# Patient Record
Sex: Female | Born: 1955 | Race: White | Hispanic: No | Marital: Married | Smoking: Never smoker
Health system: Southern US, Community
[De-identification: ages and names within clinical notes are randomized; demographics above are authoritative.]

## PROBLEM LIST (undated history)

## (undated) DIAGNOSIS — I10 Essential (primary) hypertension: Secondary | ICD-10-CM

## (undated) DIAGNOSIS — Z8489 Family history of other specified conditions: Secondary | ICD-10-CM

## (undated) DIAGNOSIS — D649 Anemia, unspecified: Secondary | ICD-10-CM

## (undated) DIAGNOSIS — N1832 Chronic kidney disease, stage 3b: Secondary | ICD-10-CM

## (undated) DIAGNOSIS — M549 Dorsalgia, unspecified: Secondary | ICD-10-CM

## (undated) DIAGNOSIS — N6009 Solitary cyst of unspecified breast: Secondary | ICD-10-CM

## (undated) DIAGNOSIS — R930 Abnormal findings on diagnostic imaging of skull and head, not elsewhere classified: Secondary | ICD-10-CM

## (undated) DIAGNOSIS — E039 Hypothyroidism, unspecified: Secondary | ICD-10-CM

## (undated) DIAGNOSIS — R7989 Other specified abnormal findings of blood chemistry: Secondary | ICD-10-CM

## (undated) DIAGNOSIS — F419 Anxiety disorder, unspecified: Secondary | ICD-10-CM

## (undated) HISTORY — DX: Other specified abnormal findings of blood chemistry: R79.89

## (undated) HISTORY — DX: Hemochromatosis, unspecified: E83.119

## (undated) HISTORY — DX: Essential (primary) hypertension: I10

## (undated) HISTORY — DX: Abnormal findings on diagnostic imaging of skull and head, not elsewhere classified: R93.0

## (undated) HISTORY — DX: Anemia, unspecified: D64.9

## (undated) HISTORY — DX: Solitary cyst of unspecified breast: N60.09

## (undated) HISTORY — PX: NASAL SEPTUM SURGERY: SHX37

---
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
## 1993-12-12 HISTORY — PX: TUBAL LIGATION: SHX77

## 1998-05-26 ENCOUNTER — Other Ambulatory Visit: Admission: RE | Admit: 1998-05-26 | Discharge: 1998-05-26 | Payer: Self-pay | Admitting: *Deleted

## 1998-07-08 ENCOUNTER — Ambulatory Visit (HOSPITAL_COMMUNITY): Admission: RE | Admit: 1998-07-08 | Discharge: 1998-07-08 | Payer: Self-pay | Admitting: *Deleted

## 1998-07-20 ENCOUNTER — Ambulatory Visit (HOSPITAL_COMMUNITY): Admission: RE | Admit: 1998-07-20 | Discharge: 1998-07-20 | Payer: Self-pay | Admitting: *Deleted

## 1999-02-24 ENCOUNTER — Other Ambulatory Visit: Admission: RE | Admit: 1999-02-24 | Discharge: 1999-02-24 | Payer: Self-pay | Admitting: *Deleted

## 1999-07-22 ENCOUNTER — Ambulatory Visit (HOSPITAL_COMMUNITY): Admission: RE | Admit: 1999-07-22 | Discharge: 1999-07-22 | Payer: Self-pay | Admitting: *Deleted

## 1999-07-22 ENCOUNTER — Encounter: Payer: Self-pay | Admitting: *Deleted

## 2000-07-24 ENCOUNTER — Encounter: Payer: Self-pay | Admitting: Gynecology

## 2000-07-24 ENCOUNTER — Ambulatory Visit (HOSPITAL_COMMUNITY): Admission: RE | Admit: 2000-07-24 | Discharge: 2000-07-24 | Payer: Self-pay | Admitting: *Deleted

## 2004-09-29 ENCOUNTER — Ambulatory Visit: Payer: Self-pay | Admitting: Obstetrics and Gynecology

## 2004-12-01 ENCOUNTER — Ambulatory Visit: Payer: Self-pay | Admitting: Internal Medicine

## 2004-12-12 ENCOUNTER — Ambulatory Visit: Payer: Self-pay | Admitting: Internal Medicine

## 2005-01-12 ENCOUNTER — Ambulatory Visit: Payer: Self-pay | Admitting: Internal Medicine

## 2005-02-25 ENCOUNTER — Ambulatory Visit: Payer: Self-pay | Admitting: Gastroenterology

## 2005-03-30 ENCOUNTER — Ambulatory Visit: Payer: Self-pay | Admitting: Internal Medicine

## 2005-04-11 ENCOUNTER — Ambulatory Visit: Payer: Self-pay | Admitting: Internal Medicine

## 2005-06-29 ENCOUNTER — Ambulatory Visit: Payer: Self-pay | Admitting: Internal Medicine

## 2005-07-12 ENCOUNTER — Ambulatory Visit: Payer: Self-pay | Admitting: Internal Medicine

## 2005-08-12 ENCOUNTER — Ambulatory Visit: Payer: Self-pay | Admitting: Internal Medicine

## 2005-09-20 ENCOUNTER — Ambulatory Visit: Payer: Self-pay | Admitting: Internal Medicine

## 2005-10-04 ENCOUNTER — Ambulatory Visit: Payer: Self-pay | Admitting: Obstetrics and Gynecology

## 2005-10-12 ENCOUNTER — Ambulatory Visit: Payer: Self-pay | Admitting: Internal Medicine

## 2005-11-11 ENCOUNTER — Ambulatory Visit: Payer: Self-pay | Admitting: Internal Medicine

## 2005-12-12 ENCOUNTER — Ambulatory Visit: Payer: Self-pay | Admitting: Internal Medicine

## 2006-01-12 ENCOUNTER — Ambulatory Visit: Payer: Self-pay | Admitting: Internal Medicine

## 2006-02-09 ENCOUNTER — Ambulatory Visit: Payer: Self-pay | Admitting: Internal Medicine

## 2006-03-12 ENCOUNTER — Ambulatory Visit: Payer: Self-pay | Admitting: Internal Medicine

## 2006-04-11 ENCOUNTER — Ambulatory Visit: Payer: Self-pay | Admitting: Internal Medicine

## 2006-05-25 ENCOUNTER — Ambulatory Visit: Payer: Self-pay | Admitting: Internal Medicine

## 2006-06-11 ENCOUNTER — Ambulatory Visit: Payer: Self-pay | Admitting: Internal Medicine

## 2006-07-20 ENCOUNTER — Ambulatory Visit: Payer: Self-pay | Admitting: Internal Medicine

## 2006-08-12 ENCOUNTER — Ambulatory Visit: Payer: Self-pay | Admitting: Internal Medicine

## 2006-08-23 ENCOUNTER — Ambulatory Visit: Payer: Self-pay | Admitting: Obstetrics and Gynecology

## 2006-10-17 ENCOUNTER — Ambulatory Visit: Payer: Self-pay | Admitting: Obstetrics and Gynecology

## 2006-10-20 ENCOUNTER — Ambulatory Visit: Payer: Self-pay | Admitting: Obstetrics and Gynecology

## 2006-11-01 ENCOUNTER — Ambulatory Visit: Payer: Self-pay | Admitting: Internal Medicine

## 2006-11-03 ENCOUNTER — Other Ambulatory Visit: Payer: Self-pay

## 2006-11-10 ENCOUNTER — Ambulatory Visit: Payer: Self-pay | Admitting: Surgery

## 2006-11-11 ENCOUNTER — Ambulatory Visit: Payer: Self-pay | Admitting: Internal Medicine

## 2006-12-14 ENCOUNTER — Ambulatory Visit: Payer: Self-pay | Admitting: Internal Medicine

## 2007-01-12 ENCOUNTER — Ambulatory Visit: Payer: Self-pay | Admitting: Internal Medicine

## 2007-02-10 ENCOUNTER — Ambulatory Visit: Payer: Self-pay | Admitting: Internal Medicine

## 2007-03-13 ENCOUNTER — Ambulatory Visit: Payer: Self-pay | Admitting: Internal Medicine

## 2007-04-12 ENCOUNTER — Ambulatory Visit: Payer: Self-pay | Admitting: Internal Medicine

## 2007-05-13 ENCOUNTER — Ambulatory Visit: Payer: Self-pay | Admitting: Internal Medicine

## 2007-06-12 ENCOUNTER — Ambulatory Visit: Payer: Self-pay | Admitting: Internal Medicine

## 2007-07-13 ENCOUNTER — Ambulatory Visit: Payer: Self-pay | Admitting: Internal Medicine

## 2007-08-13 ENCOUNTER — Ambulatory Visit: Payer: Self-pay | Admitting: Internal Medicine

## 2007-09-12 ENCOUNTER — Ambulatory Visit: Payer: Self-pay | Admitting: Internal Medicine

## 2007-10-03 ENCOUNTER — Encounter (INDEPENDENT_AMBULATORY_CARE_PROVIDER_SITE_OTHER): Payer: Self-pay | Admitting: Gynecology

## 2007-10-03 ENCOUNTER — Ambulatory Visit: Payer: Self-pay | Admitting: Gynecology

## 2007-10-05 ENCOUNTER — Ambulatory Visit: Payer: Self-pay | Admitting: Internal Medicine

## 2007-10-08 ENCOUNTER — Ambulatory Visit: Payer: Self-pay | Admitting: Gynecology

## 2007-10-13 ENCOUNTER — Ambulatory Visit: Payer: Self-pay | Admitting: Internal Medicine

## 2007-11-12 ENCOUNTER — Ambulatory Visit: Payer: Self-pay | Admitting: Internal Medicine

## 2007-11-12 ENCOUNTER — Encounter: Admission: RE | Admit: 2007-11-12 | Discharge: 2007-11-12 | Payer: Self-pay | Admitting: Gynecology

## 2007-12-13 ENCOUNTER — Ambulatory Visit: Payer: Self-pay | Admitting: Internal Medicine

## 2007-12-20 ENCOUNTER — Ambulatory Visit: Payer: Self-pay | Admitting: Internal Medicine

## 2008-01-13 ENCOUNTER — Ambulatory Visit: Payer: Self-pay | Admitting: Internal Medicine

## 2008-02-10 ENCOUNTER — Ambulatory Visit: Payer: Self-pay | Admitting: Internal Medicine

## 2008-03-12 ENCOUNTER — Ambulatory Visit: Payer: Self-pay | Admitting: Internal Medicine

## 2008-04-11 ENCOUNTER — Ambulatory Visit: Payer: Self-pay | Admitting: Internal Medicine

## 2008-04-14 ENCOUNTER — Ambulatory Visit: Payer: Self-pay | Admitting: Internal Medicine

## 2008-05-12 ENCOUNTER — Ambulatory Visit: Payer: Self-pay | Admitting: Internal Medicine

## 2008-06-11 ENCOUNTER — Ambulatory Visit: Payer: Self-pay | Admitting: Internal Medicine

## 2008-07-03 ENCOUNTER — Ambulatory Visit: Payer: Self-pay | Admitting: Internal Medicine

## 2008-07-12 ENCOUNTER — Ambulatory Visit: Payer: Self-pay | Admitting: Internal Medicine

## 2008-08-12 ENCOUNTER — Ambulatory Visit: Payer: Self-pay | Admitting: Internal Medicine

## 2008-09-11 ENCOUNTER — Ambulatory Visit: Payer: Self-pay | Admitting: Internal Medicine

## 2008-10-12 ENCOUNTER — Ambulatory Visit: Payer: Self-pay | Admitting: Internal Medicine

## 2008-11-11 ENCOUNTER — Ambulatory Visit: Payer: Self-pay | Admitting: Internal Medicine

## 2008-11-18 ENCOUNTER — Ambulatory Visit: Payer: Self-pay | Admitting: Obstetrics and Gynecology

## 2008-12-12 ENCOUNTER — Ambulatory Visit: Payer: Self-pay | Admitting: Internal Medicine

## 2008-12-17 ENCOUNTER — Ambulatory Visit: Payer: Self-pay | Admitting: Internal Medicine

## 2009-01-12 ENCOUNTER — Ambulatory Visit: Payer: Self-pay | Admitting: Internal Medicine

## 2009-03-12 ENCOUNTER — Ambulatory Visit: Payer: Self-pay | Admitting: Internal Medicine

## 2009-03-19 ENCOUNTER — Ambulatory Visit: Payer: Self-pay | Admitting: Internal Medicine

## 2009-04-11 ENCOUNTER — Ambulatory Visit: Payer: Self-pay | Admitting: Internal Medicine

## 2009-06-11 ENCOUNTER — Ambulatory Visit: Payer: Self-pay | Admitting: Internal Medicine

## 2009-06-30 ENCOUNTER — Ambulatory Visit: Payer: Self-pay | Admitting: Internal Medicine

## 2009-07-12 ENCOUNTER — Ambulatory Visit: Payer: Self-pay | Admitting: Internal Medicine

## 2009-09-11 ENCOUNTER — Ambulatory Visit: Payer: Self-pay | Admitting: Internal Medicine

## 2009-09-24 ENCOUNTER — Ambulatory Visit: Payer: Self-pay | Admitting: Internal Medicine

## 2009-10-12 ENCOUNTER — Ambulatory Visit: Payer: Self-pay | Admitting: Internal Medicine

## 2009-12-09 ENCOUNTER — Ambulatory Visit: Payer: Self-pay | Admitting: Obstetrics and Gynecology

## 2009-12-12 ENCOUNTER — Ambulatory Visit: Payer: Self-pay | Admitting: Internal Medicine

## 2009-12-12 HISTORY — PX: BREAST EXCISIONAL BIOPSY: SUR124

## 2009-12-17 ENCOUNTER — Ambulatory Visit: Payer: Self-pay | Admitting: Internal Medicine

## 2010-01-12 ENCOUNTER — Ambulatory Visit: Payer: Self-pay | Admitting: Internal Medicine

## 2010-02-09 ENCOUNTER — Ambulatory Visit: Payer: Self-pay | Admitting: Internal Medicine

## 2010-03-11 ENCOUNTER — Ambulatory Visit: Payer: Self-pay | Admitting: Internal Medicine

## 2010-03-12 ENCOUNTER — Ambulatory Visit: Payer: Self-pay | Admitting: Internal Medicine

## 2010-05-12 ENCOUNTER — Ambulatory Visit: Payer: Self-pay | Admitting: Internal Medicine

## 2010-06-07 ENCOUNTER — Ambulatory Visit: Payer: Self-pay | Admitting: Internal Medicine

## 2010-06-11 ENCOUNTER — Ambulatory Visit: Payer: Self-pay | Admitting: Internal Medicine

## 2010-07-12 ENCOUNTER — Ambulatory Visit: Payer: Self-pay | Admitting: Internal Medicine

## 2010-10-14 ENCOUNTER — Ambulatory Visit: Payer: Self-pay | Admitting: Internal Medicine

## 2010-11-11 ENCOUNTER — Ambulatory Visit: Payer: Self-pay | Admitting: Internal Medicine

## 2010-12-21 ENCOUNTER — Ambulatory Visit: Payer: Self-pay | Admitting: Obstetrics and Gynecology

## 2010-12-27 ENCOUNTER — Ambulatory Visit: Payer: Self-pay | Admitting: Internal Medicine

## 2010-12-29 LAB — AFP TUMOR MARKER: AFP-Tumor Marker: 1.8 ng/mL (ref 0.0–8.3)

## 2011-01-12 ENCOUNTER — Ambulatory Visit: Payer: Self-pay | Admitting: Internal Medicine

## 2011-01-26 HISTORY — PX: BONE MARROW BIOPSY: SHX199

## 2011-02-10 ENCOUNTER — Ambulatory Visit: Payer: Self-pay | Admitting: Internal Medicine

## 2011-03-13 ENCOUNTER — Ambulatory Visit: Payer: Self-pay | Admitting: Internal Medicine

## 2011-04-12 ENCOUNTER — Ambulatory Visit: Payer: Self-pay | Admitting: Internal Medicine

## 2011-05-13 ENCOUNTER — Ambulatory Visit: Payer: Self-pay | Admitting: Internal Medicine

## 2011-06-12 ENCOUNTER — Ambulatory Visit: Payer: Self-pay | Admitting: Internal Medicine

## 2011-07-13 ENCOUNTER — Ambulatory Visit: Payer: Self-pay | Admitting: Internal Medicine

## 2011-07-13 HISTORY — PX: COLONOSCOPY: SHX174

## 2011-07-13 HISTORY — PX: ESOPHAGOGASTRODUODENOSCOPY: SHX1529

## 2011-07-14 ENCOUNTER — Ambulatory Visit: Payer: Self-pay | Admitting: Gastroenterology

## 2011-07-18 LAB — PATHOLOGY REPORT

## 2011-08-13 ENCOUNTER — Ambulatory Visit: Payer: Self-pay | Admitting: Internal Medicine

## 2011-09-12 ENCOUNTER — Ambulatory Visit: Payer: Self-pay | Admitting: Internal Medicine

## 2011-10-13 ENCOUNTER — Ambulatory Visit: Payer: Self-pay | Admitting: Internal Medicine

## 2011-12-13 HISTORY — PX: CHOLECYSTECTOMY: SHX55

## 2011-12-26 ENCOUNTER — Ambulatory Visit: Payer: Self-pay | Admitting: Internal Medicine

## 2011-12-26 LAB — CBC CANCER CENTER
Basophil #: 0 x10 3/mm (ref 0.0–0.1)
Basophil %: 0.4 %
Eosinophil #: 0.1 x10 3/mm (ref 0.0–0.7)
Eosinophil %: 2.8 %
HCT: 31.5 % — ABNORMAL LOW (ref 35.0–47.0)
HGB: 11.2 g/dL — ABNORMAL LOW (ref 12.0–16.0)
Lymphocyte #: 0.9 x10 3/mm — ABNORMAL LOW (ref 1.0–3.6)
Lymphocyte %: 31.9 %
MCH: 34 pg (ref 26.0–34.0)
MCHC: 35.6 g/dL (ref 32.0–36.0)
MCV: 96 fL (ref 80–100)
Monocyte #: 0.3 x10 3/mm (ref 0.0–0.7)
Monocyte %: 9.5 %
Neutrophil #: 1.5 x10 3/mm (ref 1.4–6.5)
Neutrophil %: 55.4 %
Platelet: 269 x10 3/mm (ref 150–440)
RBC: 3.3 10*6/uL — ABNORMAL LOW (ref 3.80–5.20)
RDW: 14.1 % (ref 11.5–14.5)
WBC: 2.8 x10 3/mm — ABNORMAL LOW (ref 3.6–11.0)

## 2011-12-26 LAB — HEPATIC FUNCTION PANEL A (ARMC)
Albumin: 4 g/dL (ref 3.4–5.0)
Alkaline Phosphatase: 67 U/L (ref 50–136)
Bilirubin, Direct: 0.1 mg/dL (ref 0.00–0.20)
Bilirubin,Total: 0.3 mg/dL (ref 0.2–1.0)
SGOT(AST): 24 U/L (ref 15–37)
SGPT (ALT): 31 U/L
Total Protein: 7.5 g/dL (ref 6.4–8.2)

## 2011-12-26 LAB — CREATININE, SERUM
Creatinine: 1.26 mg/dL (ref 0.60–1.30)
EGFR (African American): 56 — ABNORMAL LOW
EGFR (Non-African Amer.): 47 — ABNORMAL LOW

## 2011-12-26 LAB — FERRITIN: Ferritin (ARMC): 457 ng/mL — ABNORMAL HIGH (ref 8–388)

## 2012-01-05 LAB — URINALYSIS, COMPLETE
Bacteria: NONE SEEN
Bilirubin,UR: NEGATIVE
Blood: NEGATIVE
Glucose,UR: NEGATIVE mg/dL (ref 0–75)
Hyaline Cast: 3
Ketone: NEGATIVE
Leukocyte Esterase: NEGATIVE
Nitrite: NEGATIVE
Ph: 5 (ref 4.5–8.0)
Protein: NEGATIVE
RBC,UR: 1 /HPF (ref 0–5)
Specific Gravity: 1.025 (ref 1.003–1.030)
Squamous Epithelial: 1
WBC UR: 1 /HPF (ref 0–5)

## 2012-01-05 LAB — CBC CANCER CENTER
Basophil #: 0 x10 3/mm (ref 0.0–0.1)
Basophil %: 0.5 %
Eosinophil #: 0.1 x10 3/mm (ref 0.0–0.7)
Eosinophil %: 4.3 %
HCT: 30.1 % — ABNORMAL LOW (ref 35.0–47.0)
HGB: 10.6 g/dL — ABNORMAL LOW (ref 12.0–16.0)
Lymphocyte #: 1.2 x10 3/mm (ref 1.0–3.6)
Lymphocyte %: 38.6 %
MCH: 34.4 pg — ABNORMAL HIGH (ref 26.0–34.0)
MCHC: 35.3 g/dL (ref 32.0–36.0)
MCV: 98 fL (ref 80–100)
Monocyte #: 0.3 x10 3/mm (ref 0.0–0.7)
Monocyte %: 9.2 %
Neutrophil #: 1.5 x10 3/mm (ref 1.4–6.5)
Neutrophil %: 47.4 %
Platelet: 289 x10 3/mm (ref 150–440)
RBC: 3.09 10*6/uL — ABNORMAL LOW (ref 3.80–5.20)
RDW: 14.7 % — ABNORMAL HIGH (ref 11.5–14.5)
WBC: 3.1 x10 3/mm — ABNORMAL LOW (ref 3.6–11.0)

## 2012-01-06 LAB — URINE CULTURE

## 2012-01-13 ENCOUNTER — Ambulatory Visit: Payer: Self-pay | Admitting: Obstetrics and Gynecology

## 2012-01-13 ENCOUNTER — Ambulatory Visit: Payer: Self-pay | Admitting: Internal Medicine

## 2012-01-26 LAB — CANCER CENTER HEMOGLOBIN: HGB: 11.2 g/dL — ABNORMAL LOW (ref 12.0–16.0)

## 2012-02-09 LAB — CANCER CENTER HEMOGLOBIN: HGB: 11.3 g/dL — ABNORMAL LOW (ref 12.0–16.0)

## 2012-02-10 ENCOUNTER — Ambulatory Visit: Payer: Self-pay | Admitting: Internal Medicine

## 2012-02-10 LAB — CA 125: CA 125: 20.6 U/mL (ref 0.0–34.0)

## 2012-02-10 LAB — AFP TUMOR MARKER: AFP-Tumor Marker: 2.5 ng/mL (ref 0.0–8.3)

## 2012-02-24 LAB — CANCER CENTER HEMOGLOBIN: HGB: 10.9 g/dL — ABNORMAL LOW (ref 12.0–16.0)

## 2012-03-12 ENCOUNTER — Ambulatory Visit: Payer: Self-pay | Admitting: Internal Medicine

## 2012-03-21 LAB — CANCER CENTER HEMOGLOBIN: HGB: 11.3 g/dL — ABNORMAL LOW (ref 12.0–16.0)

## 2012-04-04 LAB — HEPATIC FUNCTION PANEL A (ARMC)
Albumin: 4.4 g/dL (ref 3.4–5.0)
Alkaline Phosphatase: 71 U/L (ref 50–136)
Bilirubin, Direct: 0.1 mg/dL (ref 0.00–0.20)
Bilirubin,Total: 0.3 mg/dL (ref 0.2–1.0)
SGOT(AST): 117 U/L — ABNORMAL HIGH (ref 15–37)
SGPT (ALT): 171 U/L — ABNORMAL HIGH
Total Protein: 7.9 g/dL (ref 6.4–8.2)

## 2012-04-04 LAB — CBC CANCER CENTER
Basophil #: 0 x10 3/mm (ref 0.0–0.1)
Basophil %: 0.9 %
Eosinophil #: 0.2 x10 3/mm (ref 0.0–0.7)
Eosinophil %: 6.2 %
HCT: 34.4 % — ABNORMAL LOW (ref 35.0–47.0)
HGB: 11.5 g/dL — ABNORMAL LOW (ref 12.0–16.0)
Lymphocyte #: 1 x10 3/mm (ref 1.0–3.6)
Lymphocyte %: 30.1 %
MCH: 33 pg (ref 26.0–34.0)
MCHC: 33.5 g/dL (ref 32.0–36.0)
MCV: 99 fL (ref 80–100)
Monocyte #: 0.5 x10 3/mm (ref 0.2–0.9)
Monocyte %: 14.6 %
Neutrophil #: 1.6 x10 3/mm (ref 1.4–6.5)
Neutrophil %: 48.2 %
Platelet: 250 x10 3/mm (ref 150–440)
RBC: 3.49 10*6/uL — ABNORMAL LOW (ref 3.80–5.20)
RDW: 12.6 % (ref 11.5–14.5)
WBC: 3.4 x10 3/mm — ABNORMAL LOW (ref 3.6–11.0)

## 2012-04-04 LAB — SGOT (AST)(ARMC): SGOT(AST): 113 U/L — ABNORMAL HIGH (ref 15–37)

## 2012-04-04 LAB — ACETAMINOPHEN LEVEL: Acetaminophen: <2 ug/mL

## 2012-04-04 LAB — CREATININE, SERUM
Creatinine: 0.98 mg/dL (ref 0.60–1.30)
EGFR (African American): >60
EGFR (Non-African Amer.): >60

## 2012-04-04 LAB — ALT: SGPT (ALT): 168 U/L — ABNORMAL HIGH

## 2012-04-05 LAB — HEPATIC FUNCTION PANEL A (ARMC)
Albumin: 4.2 g/dL (ref 3.4–5.0)
Alkaline Phosphatase: 66 U/L (ref 50–136)
Bilirubin, Direct: 0.1 mg/dL (ref 0.00–0.20)
Bilirubin,Total: 0.3 mg/dL (ref 0.2–1.0)
SGOT(AST): 95 U/L — ABNORMAL HIGH (ref 15–37)
SGPT (ALT): 154 U/L — ABNORMAL HIGH
Total Protein: 7.6 g/dL (ref 6.4–8.2)

## 2012-04-11 ENCOUNTER — Ambulatory Visit: Payer: Self-pay | Admitting: Internal Medicine

## 2012-04-23 LAB — FERRITIN: Ferritin (ARMC): 63 ng/mL (ref 8–388)

## 2012-05-12 ENCOUNTER — Ambulatory Visit: Payer: Self-pay | Admitting: Internal Medicine

## 2012-05-18 ENCOUNTER — Observation Stay: Payer: Self-pay | Admitting: Surgery

## 2012-05-18 LAB — URINALYSIS, COMPLETE
Bacteria: NONE SEEN
Bilirubin,UR: NEGATIVE
Blood: NEGATIVE
Glucose,UR: NEGATIVE mg/dL
Hyaline Cast: 1
Ketone: NEGATIVE
Leukocyte Esterase: NEGATIVE
Nitrite: NEGATIVE
Ph: 5
Protein: NEGATIVE
RBC,UR: 2 /HPF
Specific Gravity: 1.021
Squamous Epithelial: 5
WBC UR: 1 /HPF

## 2012-05-18 LAB — CBC
HCT: 36.5 % (ref 35.0–47.0)
HGB: 12.3 g/dL (ref 12.0–16.0)
MCH: 32.6 pg (ref 26.0–34.0)
MCHC: 33.8 g/dL (ref 32.0–36.0)
MCV: 96 fL (ref 80–100)
Platelet: 270 10*3/uL (ref 150–440)
RBC: 3.79 10*6/uL — ABNORMAL LOW (ref 3.80–5.20)
RDW: 12.8 % (ref 11.5–14.5)
WBC: 5.4 10*3/uL (ref 3.6–11.0)

## 2012-05-18 LAB — BASIC METABOLIC PANEL WITH GFR
Anion Gap: 9
BUN: 19 mg/dL — ABNORMAL HIGH
Calcium, Total: 9.5 mg/dL
Chloride: 104 mmol/L
Co2: 27 mmol/L
Creatinine: 1.14 mg/dL
EGFR (African American): >60
EGFR (Non-African Amer.): 53 — ABNORMAL LOW
Glucose: 109 mg/dL — ABNORMAL HIGH
Osmolality: 282
Potassium: 3.9 mmol/L
Sodium: 140 mmol/L

## 2012-05-18 LAB — HEPATIC FUNCTION PANEL A (ARMC)
Albumin: 4.2 g/dL
Alkaline Phosphatase: 58 U/L
Bilirubin, Direct: <0.1 mg/dL
Bilirubin,Total: 0.2 mg/dL
SGOT(AST): 27 U/L
SGPT (ALT): 30 U/L
Total Protein: 8.1 g/dL

## 2012-05-18 LAB — LIPASE, BLOOD: Lipase: 183 U/L

## 2012-05-19 ENCOUNTER — Emergency Department: Payer: Self-pay | Admitting: Emergency Medicine

## 2012-05-19 LAB — CBC
HCT: 31.7 % — ABNORMAL LOW (ref 35.0–47.0)
HGB: 10.7 g/dL — ABNORMAL LOW (ref 12.0–16.0)
MCH: 32.7 pg (ref 26.0–34.0)
MCHC: 33.8 g/dL (ref 32.0–36.0)
MCV: 97 fL (ref 80–100)
Platelet: 210 10*3/uL (ref 150–440)
RBC: 3.27 10*6/uL — ABNORMAL LOW (ref 3.80–5.20)
RDW: 12.8 % (ref 11.5–14.5)
WBC: 4.7 10*3/uL (ref 3.6–11.0)

## 2012-05-19 LAB — COMPREHENSIVE METABOLIC PANEL
Albumin: 3.4 g/dL (ref 3.4–5.0)
Alkaline Phosphatase: 61 U/L (ref 50–136)
Anion Gap: 8 (ref 7–16)
BUN: 10 mg/dL (ref 7–18)
Bilirubin,Total: 0.3 mg/dL (ref 0.2–1.0)
Calcium, Total: 9 mg/dL (ref 8.5–10.1)
Chloride: 108 mmol/L — ABNORMAL HIGH (ref 98–107)
Co2: 26 mmol/L (ref 21–32)
Creatinine: 1.09 mg/dL (ref 0.60–1.30)
EGFR (African American): >60
EGFR (Non-African Amer.): 56 — ABNORMAL LOW
Glucose: 94 mg/dL (ref 65–99)
Osmolality: 282 (ref 275–301)
Potassium: 4.6 mmol/L (ref 3.5–5.1)
SGOT(AST): 31 U/L (ref 15–37)
SGPT (ALT): 28 U/L
Sodium: 142 mmol/L (ref 136–145)
Total Protein: 6.2 g/dL — ABNORMAL LOW (ref 6.4–8.2)

## 2012-05-19 LAB — LIPASE, BLOOD: Lipase: 292 U/L (ref 73–393)

## 2012-05-22 LAB — PATHOLOGY REPORT

## 2012-06-01 LAB — CBC CANCER CENTER
Basophil #: 0 x10 3/mm (ref 0.0–0.1)
Basophil %: 0.6 %
Eosinophil #: 0.2 x10 3/mm (ref 0.0–0.7)
Eosinophil %: 4.9 %
HCT: 37.3 % (ref 35.0–47.0)
HGB: 12.5 g/dL (ref 12.0–16.0)
Lymphocyte #: 1.2 x10 3/mm (ref 1.0–3.6)
Lymphocyte %: 32.8 %
MCH: 32.1 pg (ref 26.0–34.0)
MCHC: 33.5 g/dL (ref 32.0–36.0)
MCV: 96 fL (ref 80–100)
Monocyte #: 0.4 x10 3/mm (ref 0.2–0.9)
Monocyte %: 11.3 %
Neutrophil #: 1.8 x10 3/mm (ref 1.4–6.5)
Neutrophil %: 50.4 %
Platelet: 258 x10 3/mm (ref 150–440)
RBC: 3.89 10*6/uL (ref 3.80–5.20)
RDW: 12.9 % (ref 11.5–14.5)
WBC: 3.5 x10 3/mm — ABNORMAL LOW (ref 3.6–11.0)

## 2012-06-11 ENCOUNTER — Ambulatory Visit: Payer: Self-pay | Admitting: Internal Medicine

## 2012-07-12 ENCOUNTER — Ambulatory Visit: Payer: Self-pay

## 2012-07-12 ENCOUNTER — Ambulatory Visit: Payer: Self-pay | Admitting: Internal Medicine

## 2012-09-12 ENCOUNTER — Ambulatory Visit: Payer: Self-pay | Admitting: Internal Medicine

## 2012-10-12 ENCOUNTER — Ambulatory Visit: Payer: Self-pay | Admitting: Internal Medicine

## 2012-11-30 ENCOUNTER — Ambulatory Visit: Payer: Self-pay | Admitting: Internal Medicine

## 2012-12-12 ENCOUNTER — Ambulatory Visit: Payer: Self-pay | Admitting: Internal Medicine

## 2013-04-11 ENCOUNTER — Ambulatory Visit: Payer: Self-pay | Admitting: Internal Medicine

## 2013-05-12 ENCOUNTER — Ambulatory Visit: Payer: Self-pay | Admitting: Internal Medicine

## 2013-05-14 ENCOUNTER — Ambulatory Visit: Payer: Self-pay | Admitting: Obstetrics and Gynecology

## 2013-07-24 ENCOUNTER — Ambulatory Visit: Payer: Self-pay | Admitting: Internal Medicine

## 2013-09-25 ENCOUNTER — Ambulatory Visit: Payer: Self-pay | Admitting: Internal Medicine

## 2013-10-12 ENCOUNTER — Ambulatory Visit: Payer: Self-pay | Admitting: Internal Medicine

## 2014-01-01 ENCOUNTER — Ambulatory Visit: Payer: Self-pay | Admitting: Internal Medicine

## 2014-01-12 ENCOUNTER — Ambulatory Visit: Payer: Self-pay | Admitting: Internal Medicine

## 2014-01-30 LAB — CANCER CENTER HEMOGLOBIN: HGB: 11.6 g/dL — ABNORMAL LOW (ref 12.0–16.0)

## 2014-02-09 ENCOUNTER — Ambulatory Visit: Payer: Self-pay | Admitting: Internal Medicine

## 2014-03-12 ENCOUNTER — Ambulatory Visit: Payer: Self-pay | Admitting: Internal Medicine

## 2014-04-11 ENCOUNTER — Ambulatory Visit: Payer: Self-pay | Admitting: Internal Medicine

## 2014-05-01 DIAGNOSIS — F419 Anxiety disorder, unspecified: Secondary | ICD-10-CM | POA: Insufficient documentation

## 2014-05-01 DIAGNOSIS — F411 Generalized anxiety disorder: Secondary | ICD-10-CM | POA: Insufficient documentation

## 2014-05-01 DIAGNOSIS — M2550 Pain in unspecified joint: Secondary | ICD-10-CM | POA: Insufficient documentation

## 2014-05-01 DIAGNOSIS — I10 Essential (primary) hypertension: Secondary | ICD-10-CM | POA: Insufficient documentation

## 2014-05-12 ENCOUNTER — Ambulatory Visit: Payer: Self-pay | Admitting: Internal Medicine

## 2014-05-15 ENCOUNTER — Ambulatory Visit: Payer: Self-pay | Admitting: Obstetrics and Gynecology

## 2014-05-19 ENCOUNTER — Ambulatory Visit: Payer: Self-pay | Admitting: Obstetrics and Gynecology

## 2014-07-08 ENCOUNTER — Ambulatory Visit: Payer: Self-pay | Admitting: Internal Medicine

## 2014-07-09 ENCOUNTER — Ambulatory Visit: Payer: Self-pay | Admitting: General Surgery

## 2014-07-09 ENCOUNTER — Encounter: Payer: Self-pay | Admitting: General Surgery

## 2014-07-09 NOTE — Progress Notes (Signed)
This encounter was created in error - please disregard.

## 2014-07-09 NOTE — Progress Notes (Deleted)
Patient ID: Katie Wise, female   DOB: April 08, 1956, 58 y.o.   MRN: [id number]  Chief Complaint  Patient presents with  . Other    mammogram    HPI [name] is a 58 y.o. female HPI  No past medical history on file.  No past surgical history on file.  No family history on file.  Social History History  Substance Use Topics  . Smoking status: Not on file  . Smokeless tobacco: Not on file  . Alcohol Use: Not on file    Allergies not on file  No current outpatient prescriptions on file.   No current facility-administered medications for this visit.    Review of Systems Review of Systems  There were no vitals taken for this visit.  Physical Exam Physical Exam  Data Reviewed ***  Assessment    ***    Plan    ***       Gaspar Cola 07/09/2014, 2:22 PM

## 2014-07-10 ENCOUNTER — Encounter: Payer: Self-pay | Admitting: General Surgery

## 2014-07-12 ENCOUNTER — Ambulatory Visit: Payer: Self-pay | Admitting: Internal Medicine

## 2014-08-07 DIAGNOSIS — R9402 Abnormal brain scan: Secondary | ICD-10-CM | POA: Insufficient documentation

## 2014-08-07 DIAGNOSIS — I639 Cerebral infarction, unspecified: Secondary | ICD-10-CM | POA: Insufficient documentation

## 2014-08-07 DIAGNOSIS — R519 Headache, unspecified: Secondary | ICD-10-CM | POA: Insufficient documentation

## 2014-08-07 DIAGNOSIS — R51 Headache: Secondary | ICD-10-CM

## 2014-08-20 ENCOUNTER — Ambulatory Visit: Payer: Self-pay | Admitting: Neurology

## 2014-09-04 ENCOUNTER — Ambulatory Visit: Payer: Self-pay | Admitting: Internal Medicine

## 2014-10-30 ENCOUNTER — Ambulatory Visit: Payer: Self-pay | Admitting: Internal Medicine

## 2014-11-05 DIAGNOSIS — Z8673 Personal history of transient ischemic attack (TIA), and cerebral infarction without residual deficits: Secondary | ICD-10-CM | POA: Insufficient documentation

## 2014-11-11 ENCOUNTER — Ambulatory Visit: Payer: Self-pay | Admitting: Internal Medicine

## 2014-11-19 ENCOUNTER — Ambulatory Visit: Payer: Self-pay | Admitting: Obstetrics and Gynecology

## 2014-12-19 ENCOUNTER — Ambulatory Visit: Payer: Self-pay | Admitting: Internal Medicine

## 2015-01-12 ENCOUNTER — Ambulatory Visit: Payer: Self-pay | Admitting: Internal Medicine

## 2015-02-10 ENCOUNTER — Ambulatory Visit
Admit: 2015-02-10 | Disposition: A | Discharge status: Home / Self Care | Payer: Self-pay | Attending: Internal Medicine | Admitting: Internal Medicine

## 2015-03-18 ENCOUNTER — Ambulatory Visit
Admit: 2015-03-18 | Disposition: A | Discharge status: Home / Self Care | Payer: Self-pay | Attending: Internal Medicine | Admitting: Internal Medicine

## 2015-03-31 IMAGING — MG MM ADDITIONAL VIEWS AT NO CHARGE
1 series · 2 of 2 positions shown · non-contrast
Comparison: With priors.

CLINICAL DATA: Abnormal right screening mammogram.

EXAM:
DIGITAL DIAGNOSTIC  RIGHT MAMMOGRAM WITH CAD
ULTRASOUND RIGHT BREAST

[R ML · right · 2 of 2 slices shown]
[im 1/2]
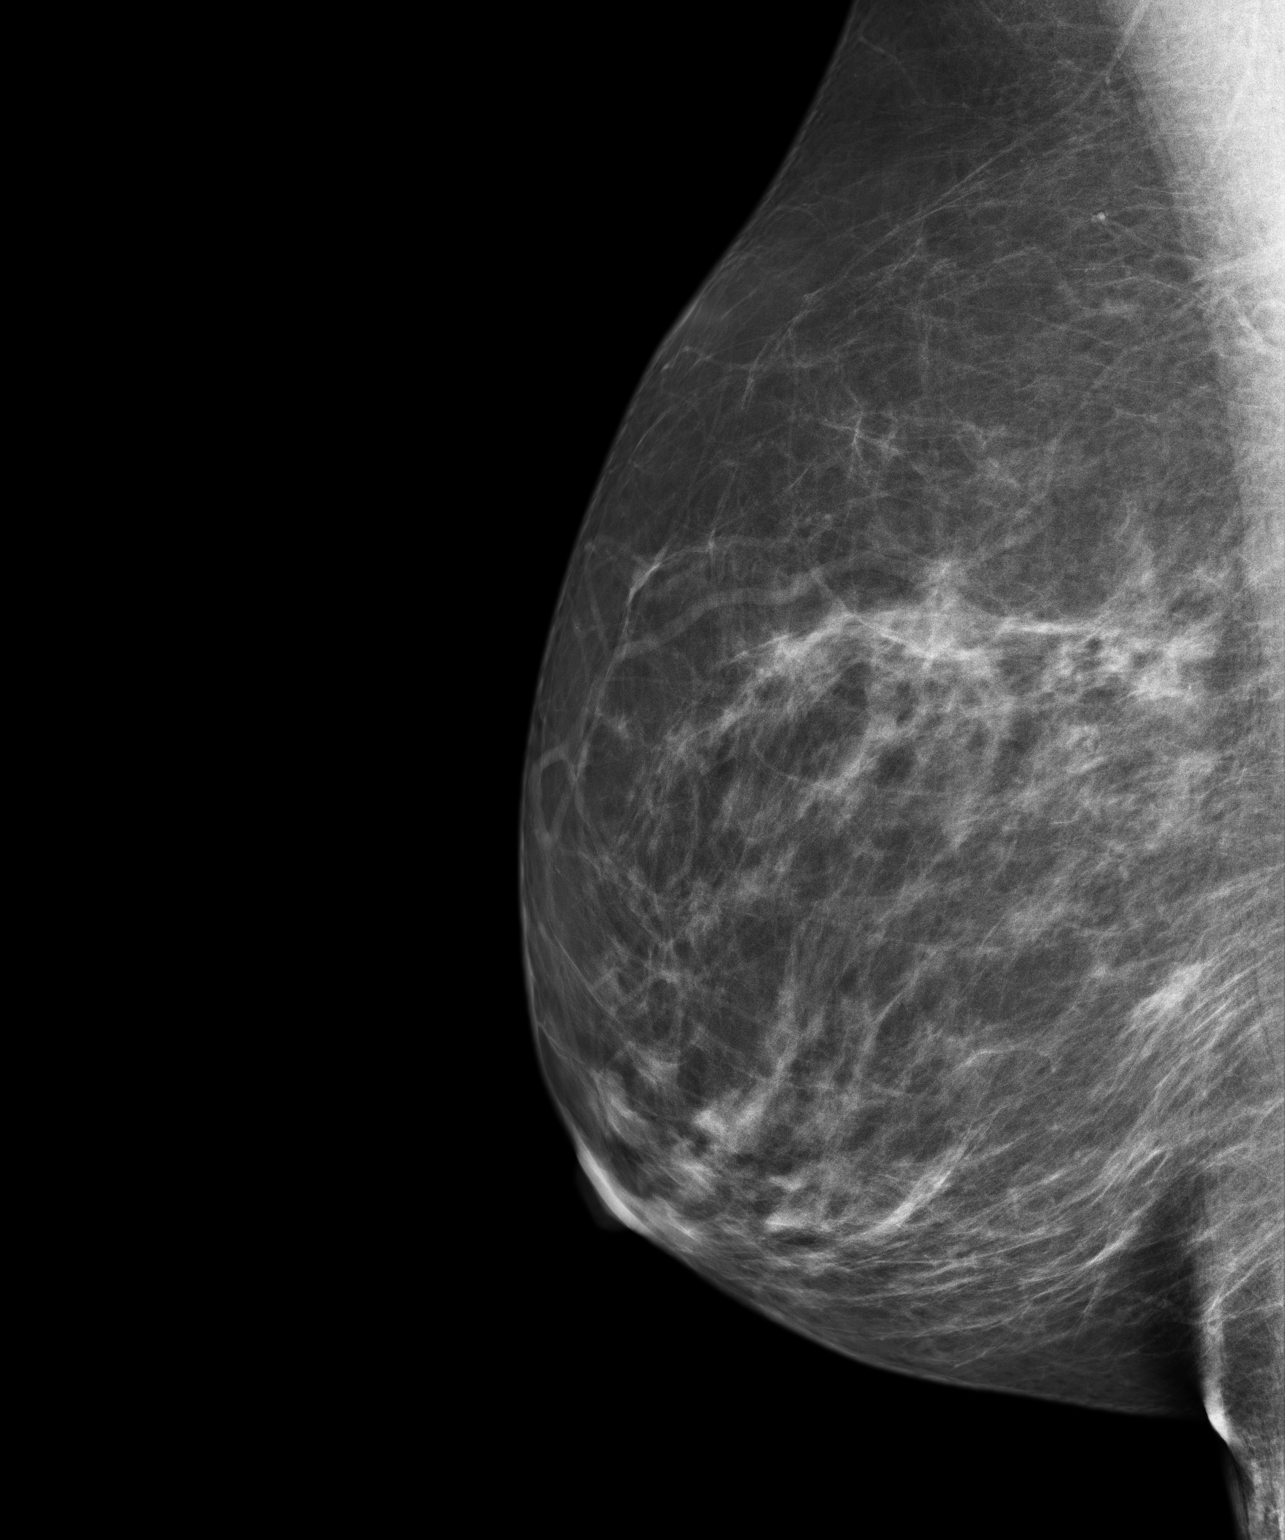
[im 2/2]
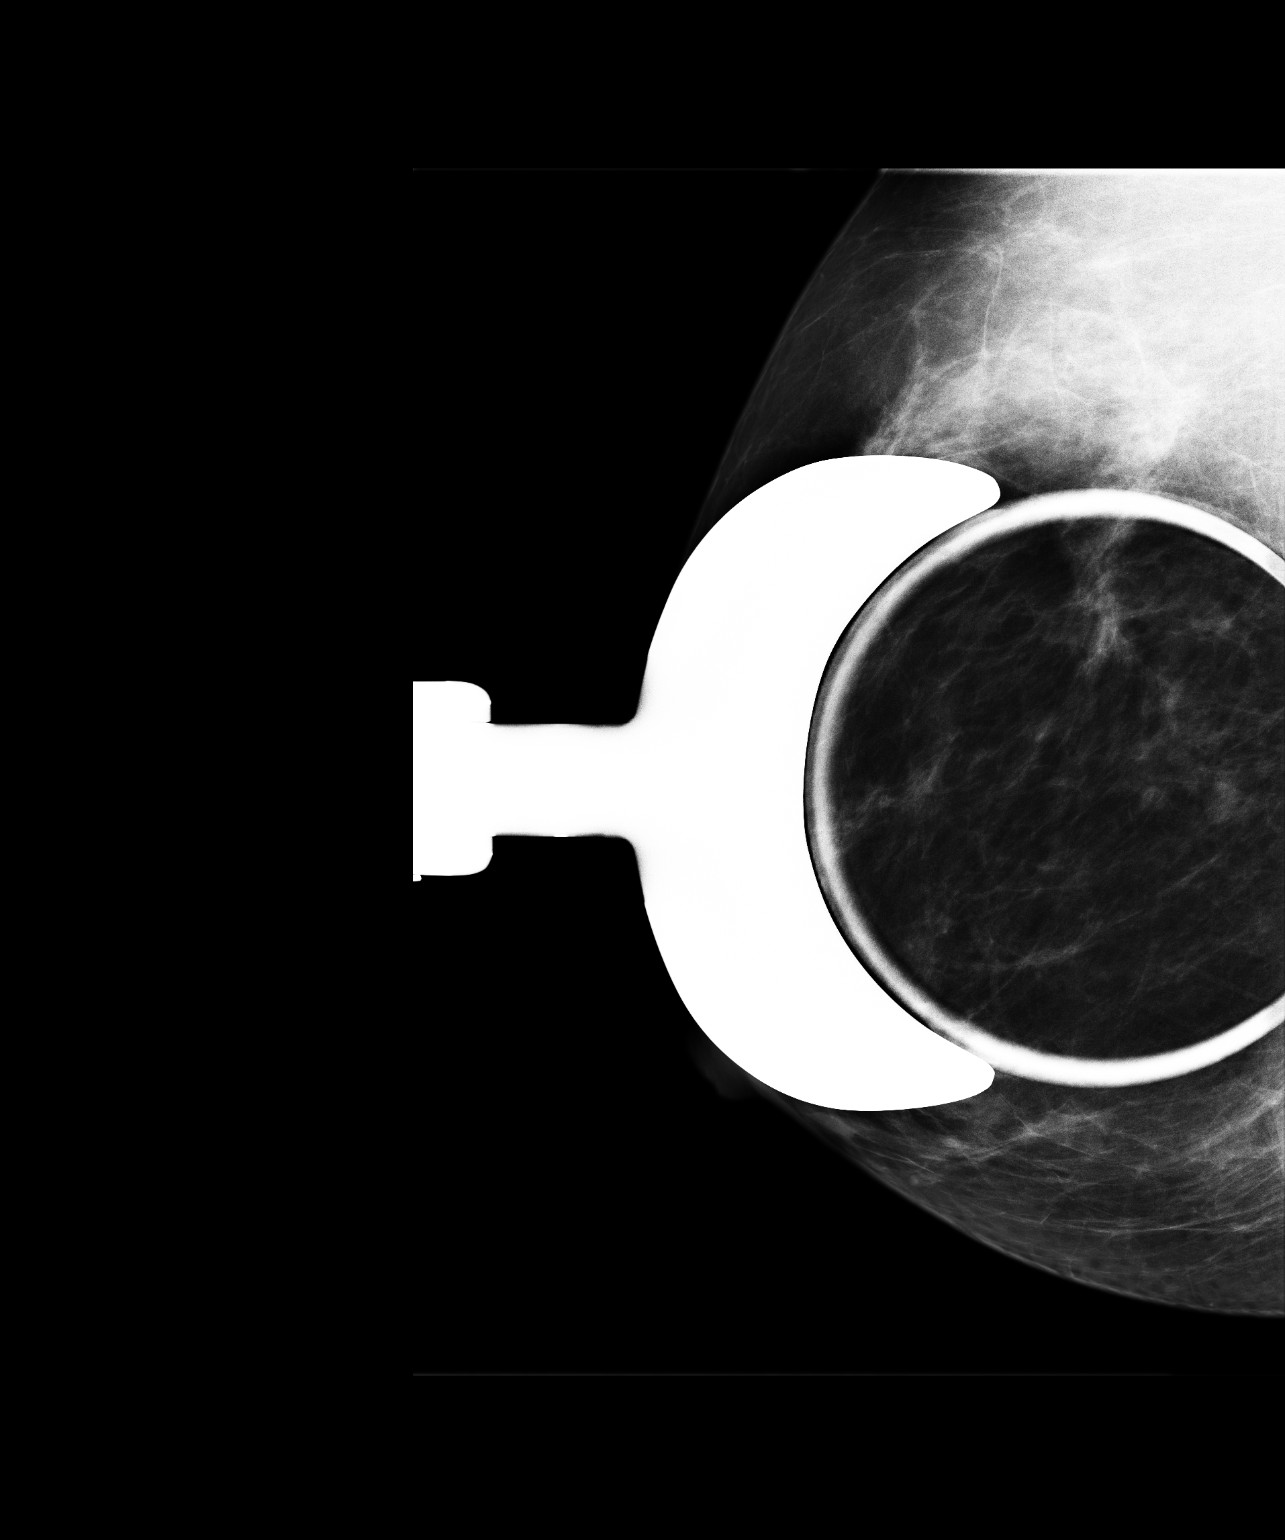

[2 of 2 positions shown; findings below may reference images not displayed]

ACR Breast Density Category b: There are scattered areas of
fibroglandular density.
FINDINGS: Additional imaging of the right breast was performed. There is a 7
mm nodule superiorly in the right breast. It is not seen on the CC
view.

Mammographic images were processed with CAD.

On physical exam, I do not palpate a mass in the right breast.

Ultrasound is performed, showing a well-circumscribed hypoechoic
lesion with increased through transmission in the right breast at 10
o'clock 5 cm from the nipple measuring 4 x 3 x 4 mm.
IMPRESSION: Probable benign cyst in the right breast.

RECOMMENDATION:
Short-term interval followup right mammogram and ultrasound in 6
months is recommended.

I have discussed the findings and recommendations with the patient.
Results were also provided in writing at the conclusion of the
visit. If applicable, a reminder letter will be sent to the patient
regarding the next appointment.

BI-RADS CATEGORY  3: Probably benign.

## 2015-04-05 NOTE — Discharge Summary (Signed)
PATIENT NAME:  Katie Wise, PETRAGLIA MR#:  [id number] DATE OF BIRTH:  August 14, 1956  DATE OF ADMISSION:  05/18/2012 DATE OF DISCHARGE:  05/19/2012  BRIEF HISTORY: Ms. [name] is a 59 year old woman admitted through the Emergency Room with signs and symptoms consistent with acute cholecystitis. She had been having symptoms for several months intermittently and then had a sudden episode on the day prior to admission. Work-up demonstrated what appeared to be acute cholecystitis. After appropriate preoperative preparation and informed consent, she was taken to surgery on the afternoon of 05/18/2012. She underwent a laparoscopic cholecystectomy with cholangiography. The procedure was uncomplicated. She did have evidence of acute cholecystitis. She had some mild nausea and pain control problems the evening after surgery but this morning has improved, doing well overall. She is tolerating a regular diet. Her wounds look good. There is no sign of any infection. Her pain control was satisfactory. She will be discharged home on her current medications.  MEDICATIONS: 1. Lisinopril 20 mg p.o. daily.  2. Effexor-XR 37 mg p.o. daily.  3. Prempro 0.25 mg p.o. daily.  4. Bupropion 150 mg/12 hours once a day. 5. Vitamin D 1000 units once a day. 6. Folic acid 1 mg once a day.  7. Flonase 50 mcg, two sprays daily p.r.n.  8. She will also be taking Vicodin for pain.   FINAL DISCHARGE DIAGNOSIS: Acute cholecystitis.   PROCEDURE: Laparoscopic cholecystectomy.  ____________________________ Micheline Maze, MD rle:bjt D:  05/19/2012 15:08:21 ET         T: 05/21/2012 12:21:08 ET         JOB#: [id number]  cc: Micheline Maze, MD, <Dictator> Youlanda Roys. Lovie Macadamia, MD Rodena Goldmann MD ELECTRONICALLY SIGNED 05/22/2012 8:38

## 2015-04-05 NOTE — H&P (Signed)
PATIENT NAME:  Katie Wise, Katie Wise MR#:  [id number] DATE OF BIRTH:  05/10/1956  DATE OF ADMISSION:  05/18/2012  PRIMARY CARE PHYSICIAN: [facility] physician.   ADMITTING PHYSICIAN:  Dr. Pat Patrick   CHIEF COMPLAINT: Abdominal pain.   BRIEF HISTORY: Katie Wise is a 59 year old woman seen in the Emergency Room with an 8 to 10-hour history of severe midepigastric right upper quadrant abdominal pain. The pain came on suddenly, waking her from sleep. She was doubled over in discomfort and could not get her breath. She presented to the Emergency Room for further evaluation.   She relates similar episodes over the last several months, the last being approximately six months ago. She was evaluated at that time and found to have mobile gallstones. No further intervention was undertaken at that time. She has no history of hepatitis, yellow jaundice, pancreatitis, peptic ulcer disease, or diverticulitis. She does have a history of hemochromatosis and has been evaluated by the hematologist. She is intermittently treated with venipuncture and removal of blood. She has not recently had any significant problems. She does have a history of hypertension but no cardiac disease or diabetes. Only previous surgery was bilateral tubal ligation.  Worked up in the Emergency Room revealed normal laboratory values. Hemoglobin was 12.3 grams. White blood cell count was normal. Liver function studies were unremarkable. There was no significant abnormality in her electrolytes. Ultrasound demonstrated multiple stones, mild gallbladder wall thickening, and a positive sonographic Murphy's sign. There did not appear to be any obvious obstruction.   HOME MEDICATIONS: 1. Alprazolam 0.25 mg p.o. b.i.d.  2. Biotin 10 mg p.o. daily.  3. Effexor-XR 37 mg p.o. daily.  4. Lisinopril 20 mg p.o. daily. 5. Prempro 0.3-1.5 mg p.o. daily.  6. Vitamin D. 7. Wellbutrin XL 150 mg p.o. daily.   ALLERGIES: She is allergic to sulfa drugs.   SOCIAL  HISTORY:  She does not smoke cigarettes or drink alcohol and she does not work outside her home.   FAMILY HISTORY: Noncontributory.   REVIEW OF SYSTEMS: Undertaken. 10-point evaluation. No other significant abnormalities identified other than those noted above in the history of present illness.   PHYSICAL EXAMINATION:  GENERAL: She is an alert woman, obviously uncomfortable.   VITAL SIGNS:  Blood pressure 125/75, heart rate 72 and regular. She is afebrile. Oxygen saturation is normal.   HEENT: No scleral icterus. No facial deformity. Normal pupillary reaction.   NECK: Supple without adenopathy. Trachea is midline. I cannot palpate her thyroid gland.   CHEST: Clear with no adventitious sounds. She has normal pulmonary excursion.   CARDIAC: No murmurs or gallops to my ear and seems to be in normal sinus rhythm.   ABDOMEN: Soft with some minimal right upper quadrant and midepigastric tenderness. She has had some pain medication. She does not have a positive Murphy's sign. No rebound, guarding, masses, or hernias.   EXTREMITIES: Lower extremity exam reveals full range of motion, no deformities. Good distal pulses.   PSYCHIATRIC: Mild anxiety. Otherwise normal affect and orientation.   IMPRESSION: This woman likely has biliary colic. I do not see any evidence of acute cholecystitis. However, she is increasingly symptomatic and I think we should consider surgical intervention on an urgent basis for pain relief. This plan has been discussed with the patient in detail and she is in agreement. The risks, benefits, and options have been outlined and accepted. She last ate approximately five hours ago so we will delay surgery at least another several hours.  ____________________________ Rodena Goldmann III, MD rle:bjt D: 05/18/2012 07:52:25 ET T: 05/18/2012 10:06:16 ET JOB#: [id number]  cc: Micheline Maze, MD, <Dictator> Simonne Come. Inez Pilgrim, MD Rodena Goldmann MD ELECTRONICALLY SIGNED 05/20/2012  7:09

## 2015-04-05 NOTE — Op Note (Signed)
PATIENT NAME:  Katie Wise, WRENN MR#:  [id number] DATE OF BIRTH:  Mar 18, 1956  DATE OF PROCEDURE:  05/18/2012  PREOPERATIVE DIAGNOSIS:  1. Biliary colic.  2. Acute cholecystitis. 3. Cholelithiasis.   POSTOPERATIVE DIAGNOSIS:  1. Biliary colic.  2. Acute cholecystitis. 3. Cholelithiasis.   PROCEDURE: Laparoscopic cholecystectomy with cholangiography.   SURGEON: Rodena Goldmann, III, MD    ANESTHESIA: General.   OPERATIVE PROCEDURE: With the patient in the supine position and after induction of appropriate general anesthesia, the patient's abdomen was prepped with ChloraPrep and draped with sterile towels. The patient was placed in the head down, feet up position. A small infraumbilical incision was made in the standard fashion and carried down bluntly through the subcutaneous tissue. The Veress needle was used to cannulate the peritoneal cavity. CO2 was insufflated to appropriate pressure measurements. When approximately 2.5 liters of CO2 were instilled, the Veress needle was withdrawn. An 11 mm [employer] Port was inserted into the peritoneal cavity. Intraperitoneal position was confirmed. CO2 was reinsufflated. The patient was placed in the head up, feet down position and rotated slightly to the left side. A subxiphoid transverse incision was made and an 11 mm port inserted under direct vision. Two lateral ports 5 mm in size were inserted under direct vision. The gallbladder was edematous, swollen and discolored. It was grasped, retracted superiorly and laterally. The cystic artery and cystic duct were identified. The cystic duct was clipped on the gallbladder side and opened. An on-table cholangiogram using dynamic fluoroscopy revealed free flow of dye into the duodenum. Intrahepatic radicles were seen. No obstruction was identified. The catheter was withdrawn. The cystic duct was doubly clipped on the common duct side. The duct was then divided. The cystic artery was doubly clipped and divided.  The gallbladder was then dissected free from its bed in the liver using hook and cautery apparatus. The camera remained in the umbilical port, and the gallbladder was removed through the subxiphoid port. The subxiphoid incision was closed with figure-of-eight suture of 0 Vicryl under direct vision. The abdomen was then irrigated with warm saline solution. The abdomen was desufflated. All ports were withdrawn without difficulty. Skin incisions were closed with 5-0 nylon. The area was infiltrated with 0.25% Marcaine for postoperative pain control. Sterile dressings were applied. The patient was returned to the recovery room having tolerated the procedure well. Sponge, instrument, and needle counts were correct x2 in the Operating Room.   ____________________________ Micheline Maze, MD rle:cbb D: 05/18/2012 13:17:57 ET T: 05/18/2012 13:49:21 ET JOB#: [id number]  cc: Micheline Maze, MD, <Dictator> Youlanda Roys. Lovie Macadamia, MD Rodena Goldmann MD ELECTRONICALLY SIGNED 05/20/2012 7:09

## 2015-04-07 ENCOUNTER — Other Ambulatory Visit: Payer: Self-pay | Admitting: Obstetrics and Gynecology

## 2015-04-07 DIAGNOSIS — Z1231 Encounter for screening mammogram for malignant neoplasm of breast: Secondary | ICD-10-CM

## 2015-04-22 ENCOUNTER — Ambulatory Visit: Payer: PRIVATE HEALTH INSURANCE | Admitting: Internal Medicine

## 2015-04-24 ENCOUNTER — Ambulatory Visit: Payer: Self-pay | Admitting: Internal Medicine

## 2015-05-13 ENCOUNTER — Inpatient Hospital Stay: Payer: PRIVATE HEALTH INSURANCE

## 2015-05-13 ENCOUNTER — Encounter: Payer: Self-pay | Admitting: Internal Medicine

## 2015-05-13 ENCOUNTER — Inpatient Hospital Stay: Payer: PRIVATE HEALTH INSURANCE | Attending: Family Medicine | Admitting: Family Medicine

## 2015-05-13 DIAGNOSIS — I10 Essential (primary) hypertension: Secondary | ICD-10-CM | POA: Diagnosis not present

## 2015-05-13 DIAGNOSIS — Z79899 Other long term (current) drug therapy: Secondary | ICD-10-CM | POA: Diagnosis not present

## 2015-05-13 DIAGNOSIS — Z8673 Personal history of transient ischemic attack (TIA), and cerebral infarction without residual deficits: Secondary | ICD-10-CM | POA: Insufficient documentation

## 2015-05-13 NOTE — Progress Notes (Signed)
[facility]  Telephone:(336) [phone]  Fax:(336) [place]: 06/28/1956  MR#: [id number]  LVD#:[id number]  Patient Care Team: [name], MD as PCP - General (Family Medicine) Dallas Schimke, MD (Internal Medicine) Christene Lye, MD (General Surgery)  CHIEF COMPLAINT:  Chief Complaint  Patient presents with  . Follow-up    Is scheduled for a phlebotomy today...    INTERVAL HISTORY:  Patient is here for further evaluation and treatment consideration regarding hemochromatosis. She also has a history of stroke several years ago. She reports feeling well today. Has her labs drawn at [facility]. Previously noted target Ferritin is <300 if asymptomatic.  REVIEW OF SYSTEMS:   Review of Systems  Constitutional: Negative for fever, chills and malaise/fatigue.  Respiratory: Negative for cough, shortness of breath and wheezing.   Cardiovascular: Negative for chest pain, claudication and leg swelling.  Gastrointestinal: Negative for nausea, vomiting, diarrhea, constipation, blood in stool and melena.  Musculoskeletal: Negative for back pain, falls and neck pain.  Skin: Negative for itching and rash.  Neurological: Negative for dizziness, tingling, focal weakness, loss of consciousness and weakness.    As per HPI. Otherwise, a complete review of systems is negatve.  ONCOLOGY HISTORY:  No history exists.    PAST MEDICAL HISTORY: Past Medical History  Diagnosis Date  . Hypertension   . Hemochromatosis     Homozygous on genetic testing March 2002  . Anemia   . Breast cyst      HAD SURGICAL F/U WITH DR Pat Patrick   . Abnormal CT scan, sinus     VASCULAR ABNORMALITY    PAST SURGICAL HISTORY: Past Surgical History  Procedure Laterality Date  . Colonoscopy  07/2011  . Esophagogastroduodenoscopy  07/2011    FAMILY HISTORY Family History  Problem Relation Age of Onset  . Ovarian cancer Mother     BRCA status reported negative     GYNECOLOGIC HISTORY:  No LMP recorded. Patient is postmenopausal.     ADVANCED DIRECTIVES:    HEALTH MAINTENANCE: History  Substance Use Topics  . Smoking status: Never Smoker   . Smokeless tobacco: Never Used  . Alcohol Use: No     Colonoscopy:  PAP:  Bone density:  Lipid panel:  Allergies  Allergen Reactions  . Hydromorphone Itching  . Norvasc [Amlodipine Besylate] Itching  . Sulfa Antibiotics Hives    Current Outpatient Prescriptions  Medication Sig Dispense Refill  . ALAWAY 0.025 % ophthalmic solution     . ALPRAZolam (XANAX) 0.25 MG tablet Take 0.25 mg by mouth at bedtime as needed for anxiety. 0.5 tablet to 1 whole tablet every 12 hours as needed for anxiety    . Biotin 1 MG CAPS Take 1 capsule by mouth 1 day or 1 dose.    . Cholecalciferol 1000 UNITS capsule Take 1,000 Units by mouth 2 (two) times daily.    Marland Kitchen estrogen, conjugated,-medroxyprogesterone (PREMPRO) 0.625-2.5 MG per tablet Take 1 tablet by mouth daily.    Marland Kitchen lisinopril (PRINIVIL,ZESTRIL) 20 MG tablet Take 20 mg by mouth daily.    . nortriptyline (PAMELOR) 10 MG capsule Take 20 mg by mouth at bedtime.    . SYSTANE ULTRA 0.4-0.3 % SOLN     . topiramate (TOPAMAX) 25 MG tablet     . venlafaxine (EFFEXOR) 75 MG tablet Take 75 mg by mouth daily. Take 2 capsules daily...    . amoxicillin (AMOXIL) 250 MG capsule Take 250 mg by mouth 2 (two) times  daily.     No current facility-administered medications for this visit.    OBJECTIVE: BP 115/79 mmHg  Pulse 89  Temp(Src) 98.6 F (37 C) (Tympanic)  Resp 16  Wt 149 lb 0.5 oz (67.6 kg)   Body mass index is 26.41 kg/(m^2).    ECOG FS:0 - Asymptomatic  General: Well-developed, well-nourished, no acute distress. Eyes: Pink conjunctiva, anicteric sclera. HEENT: Normocephalic, moist mucous membranes, clear oropharnyx. Lungs: Clear to auscultation bilaterally. Heart: Regular rate and rhythm. No rubs, murmurs, or gallops. Abdomen: Soft, nontender, nondistended.  No organomegaly noted, normoactive bowel sounds. Musculoskeletal: No edema, cyanosis, or clubbing. Neuro: Alert, answering all questions appropriately. Cranial nerves grossly intact. Skin: No rashes or petechiae noted. Psych: Normal affect.   LAB RESULTS:     Component Value Date/Time   NA 142 05/19/2012 1957   K 4.6 05/19/2012 1957   CL 108* 05/19/2012 1957   CO2 26 05/19/2012 1957   GLUCOSE 94 05/19/2012 1957   BUN 10 05/19/2012 1957   CREATININE 1.09 05/19/2012 1957   CALCIUM 9.0 05/19/2012 1957   PROT 6.2* 05/19/2012 1957   ALBUMIN 3.4 05/19/2012 1957   AST 31 05/19/2012 1957   ALT 28 05/19/2012 1957   ALKPHOS 61 05/19/2012 1957   GFRNONAA 56* 05/19/2012 1957   GFRAA >60 05/19/2012 1957    No results found for: SPEP, UPEP  Lab Results  Component Value Date   WBC 3.5* 06/01/2012   NEUTROABS 1.8 06/01/2012   HGB 11.6* 01/30/2014   HCT 37.3 06/01/2012   MCV 96 06/01/2012   PLT 258 06/01/2012      Chemistry      Component Value Date/Time   NA 142 05/19/2012 1957   K 4.6 05/19/2012 1957   CL 108* 05/19/2012 1957   CO2 26 05/19/2012 1957   BUN 10 05/19/2012 1957   CREATININE 1.09 05/19/2012 1957      Component Value Date/Time   CALCIUM 9.0 05/19/2012 1957   ALKPHOS 61 05/19/2012 1957   AST 31 05/19/2012 1957   ALT 28 05/19/2012 1957       No results found for: LABCA2  No components found for: LABCA125  No results for input(s): INR in the last 168 hours.  No results found for: COLORURINE, APPEARANCEUR, LABSPEC, PHURINE, GLUCOSEU, HGBUR, BILIRUBINUR, KETONESUR, PROTEINUR, UROBILINOGEN, NITRITE, LEUKOCYTESUR  STUDIES: No results found.  ASSESSMENT:  1. Hemochromatosis.  PLAN:   1. Hemochromatosis. As previously noted by Dr. Inez Pilgrim, goal is to maintain a ferritin level <300 if asymptomatic and as long as Hgb is stable above 11gm or a low level phlebotomy would be performed (~177m approximately every 2 weeks).  Labs reported from [facility]: serum  iron 231, ferritin 179, hgb 11.7. No phlebotomy is scheduled for today. She will receive a B12 injection today per previous plan with Dr. GInez Pilgrim  She will return in 6 weeks for continued labs and possible phlebotomy and again in 12 weeks for provider visit and reevaluation of labs.   Patient expressed understanding and was in agreement with this plan. She also understands that She can call clinic at any time with any questions, concerns, or complaints.    No matching staging information was found for the patient.  LEvlyn Kanner NP   05/13/2015 11:20 AM

## 2015-05-18 ENCOUNTER — Ambulatory Visit
Admission: RE | Admit: 2015-05-18 | Discharge: 2015-05-18 | Disposition: A | Discharge status: Home / Self Care | Payer: PRIVATE HEALTH INSURANCE | Source: Ambulatory Visit | Attending: Obstetrics and Gynecology | Admitting: Obstetrics and Gynecology

## 2015-05-18 DIAGNOSIS — Z1231 Encounter for screening mammogram for malignant neoplasm of breast: Secondary | ICD-10-CM | POA: Diagnosis not present

## 2015-06-12 ENCOUNTER — Inpatient Hospital Stay: Payer: PRIVATE HEALTH INSURANCE | Admitting: Family Medicine

## 2015-06-12 ENCOUNTER — Inpatient Hospital Stay: Payer: PRIVATE HEALTH INSURANCE

## 2015-08-05 ENCOUNTER — Inpatient Hospital Stay: Payer: PRIVATE HEALTH INSURANCE | Admitting: Family Medicine

## 2015-08-05 ENCOUNTER — Ambulatory Visit: Payer: PRIVATE HEALTH INSURANCE | Admitting: Family Medicine

## 2015-08-05 ENCOUNTER — Inpatient Hospital Stay: Payer: PRIVATE HEALTH INSURANCE

## 2015-08-06 ENCOUNTER — Other Ambulatory Visit: Payer: Self-pay | Admitting: Family Medicine

## 2015-08-07 ENCOUNTER — Inpatient Hospital Stay: Payer: PRIVATE HEALTH INSURANCE | Admitting: Family Medicine

## 2015-08-07 ENCOUNTER — Inpatient Hospital Stay: Payer: PRIVATE HEALTH INSURANCE

## 2015-09-15 ENCOUNTER — Other Ambulatory Visit: Payer: Self-pay

## 2015-09-15 DIAGNOSIS — I43 Cardiomyopathy in diseases classified elsewhere: Principal | ICD-10-CM

## 2015-09-15 DIAGNOSIS — D509 Iron deficiency anemia, unspecified: Secondary | ICD-10-CM

## 2015-09-16 LAB — CBC WITH DIFFERENTIAL/PLATELET
Basophils Absolute: 0 10*3/uL (ref 0.0–0.2)
Basos: 0 %
EOS (ABSOLUTE): 0 10*3/uL (ref 0.0–0.4)
Eos: 0 %
Hematocrit: 33.7 % — ABNORMAL LOW (ref 34.0–46.6)
Hemoglobin: 11.2 g/dL (ref 11.1–15.9)
Immature Grans (Abs): 0 10*3/uL (ref 0.0–0.1)
Immature Granulocytes: 0 %
Lymphocytes Absolute: 1.3 10*3/uL (ref 0.7–3.1)
Lymphs: 30 %
MCH: 31.1 pg (ref 26.6–33.0)
MCHC: 33.2 g/dL (ref 31.5–35.7)
MCV: 94 fL (ref 79–97)
Monocytes Absolute: 0.4 10*3/uL (ref 0.1–0.9)
Monocytes: 9 %
Neutrophils Absolute: 2.5 10*3/uL (ref 1.4–7.0)
Neutrophils: 61 %
Platelets: 295 10*3/uL (ref 150–379)
RBC: 3.6 x10E6/uL — ABNORMAL LOW (ref 3.77–5.28)
RDW: 13.2 % (ref 12.3–15.4)
WBC: 4.2 10*3/uL (ref 3.4–10.8)

## 2015-09-16 LAB — FERRITIN: Ferritin: 205 ng/mL — ABNORMAL HIGH (ref 15–150)

## 2015-09-17 ENCOUNTER — Encounter: Payer: Self-pay | Admitting: *Deleted

## 2015-09-18 ENCOUNTER — Inpatient Hospital Stay: Payer: PRIVATE HEALTH INSURANCE

## 2015-09-18 ENCOUNTER — Inpatient Hospital Stay: Payer: PRIVATE HEALTH INSURANCE | Attending: Family Medicine | Admitting: Internal Medicine

## 2015-09-18 DIAGNOSIS — I10 Essential (primary) hypertension: Secondary | ICD-10-CM | POA: Diagnosis not present

## 2015-09-18 DIAGNOSIS — Z79899 Other long term (current) drug therapy: Secondary | ICD-10-CM | POA: Insufficient documentation

## 2015-09-18 NOTE — Progress Notes (Signed)
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 Wise denies any unusual shortness of breath cough or abdominal distention or swelling in the legs. Denies any skin changes. Denies any weight loss.  REVIEW OF SYSTEMS:  Katie complete 10 point review of system is done which is negative except mentioned above/history of present illness.    PAST MEDICAL HISTORY :  Past Medical History  Diagnosis Date  . Hypertension   . Hemochromatosis     Homozygous on genetic testing March 2002  . Anemia   . Abnormal CT scan, sinus     VASCULAR ABNORMALITY  . Breast cyst 8+ years     HAD SURGICAL F/U WITH DR Pat Patrick     PAST SURGICAL HISTORY :   Past Surgical History  Procedure Laterality Date  . Colonoscopy  07/2011  . Esophagogastroduodenoscopy  07/2011  . Breast biopsy Right 2011    Negative  . Bone marrow biopsy  01/26/2011    FLOW SHOWS POSITIVE FOR LGL'S, FISH IS NEG,    FAMILY HISTORY :   Family History  Problem Relation Age of Onset  . Ovarian cancer Mother     BRCA status reported negative  . Breast cancer Maternal Aunt 70    SOCIAL HISTORY:   Social History  Substance Use Topics  . Smoking status: Never Smoker   . Smokeless tobacco: Never Used  . Alcohol Use: No    ALLERGIES:  is allergic to hydromorphone; norvasc; and sulfa antibiotics.  MEDICATIONS:  Current Outpatient Prescriptions  Medication Sig  Dispense Refill  . ALAWAY 0.025 % ophthalmic solution     . ALPRAZolam (XANAX) 0.25 MG tablet Take 0.25 mg by mouth at bedtime as needed for anxiety. 0.5 tablet to 1 whole tablet every 12 hours as needed for anxiety    . Biotin 1 MG CAPS Take 1 capsule by mouth 1 day or 1 dose.    . Cholecalciferol 1000 UNITS capsule Take 1,000 Units by mouth 2 (two) times daily.    . Cyanocobalamin (B-12 PO) Take 1 tablet by mouth daily.    Marland Kitchen estrogen, conjugated,-medroxyprogesterone (PREMPRO) 0.625-2.5 MG per tablet Take 1 tablet by mouth daily.    Marland Kitchen lisinopril (PRINIVIL,ZESTRIL) 20 MG tablet Take 20 mg by mouth daily.    . SYSTANE ULTRA 0.4-0.3 % SOLN Apply 1 drop to eye daily as needed (dry eyes).     . venlafaxine (EFFEXOR) 75 MG tablet Take 75 mg by mouth daily.      No current facility-administered medications for this visit.    PHYSICAL EXAMINATION: ECOG PERFORMANCE STATUS: 0 - Asymptomatic  BP 111/75 mmHg  Pulse 77  Temp(Src) 97 F (36.1 C) (Tympanic)  Resp 18  Ht 5' 2.99" (1.6 m)  Wt 139 lb 12.4 oz (63.4 kg)  BMI 24.77 kg/m2  SpO2 100%  Filed Weights   09/18/15 1015  Weight: 139 lb 12.4 oz (63.4 kg)    GENERAL: Well-nourished well-developed; Alert, no distress and comfortable.   She  is alone. EYES: no pallor or icterus OROPHARYNX: no thrush or ulceration; good dentition  NECK: supple, no masses felt LYMPH:  no palpable lymphadenopathy in the cervical, axillary or inguinal regions LUNGS: clear to auscultation and  No wheeze or crackles HEART/CVS: regular rate & rhythm and no murmurs; No lower extremity edema ABDOMEN:abdomen soft, non-tender and normal bowel sounds Musculoskeletal:no cyanosis of digits and no clubbing  PSYCH: alert & oriented x 3 with fluent speech NEURO: no focal motor/sensory deficits SKIN:  no rashes or significant lesions  LABORATORY DATA:  I have reviewed the data as listed    Component Value Date/Time   NA 142 05/19/2012 1957   K 4.6 05/19/2012 1957    CL 108* 05/19/2012 1957   CO2 26 05/19/2012 1957   GLUCOSE 94 05/19/2012 1957   BUN 10 05/19/2012 1957   CREATININE 1.09 05/19/2012 1957   CALCIUM 9.0 05/19/2012 1957   PROT 6.2* 05/19/2012 1957   ALBUMIN 3.4 05/19/2012 1957   AST 31 05/19/2012 1957   ALT 28 05/19/2012 1957   ALKPHOS 61 05/19/2012 1957   BILITOT 0.3 05/19/2012 1957   GFRNONAA 56* 05/19/2012 1957   GFRNONAA 47* 12/26/2011 1349   GFRAA >60 05/19/2012 1957   GFRAA 56* 12/26/2011 1349    No results found for: SPEP, UPEP  Lab Results  Component Value Date   WBC 4.2 09/15/2015   NEUTROABS 2.5 09/15/2015   HGB 11.6* 01/30/2014   HCT 33.7* 09/15/2015   MCV 96 06/01/2012   PLT 258 06/01/2012      Chemistry      Component Value Date/Time   NA 142 05/19/2012 1957   K 4.6 05/19/2012 1957   CL 108* 05/19/2012 1957   CO2 26 05/19/2012 1957   BUN 10 05/19/2012 1957   CREATININE 1.09 05/19/2012 1957      Component Value Date/Time   CALCIUM 9.0 05/19/2012 1957   ALKPHOS 61 05/19/2012 1957   AST 31 05/19/2012 1957   ALT 28 05/19/2012 1957   BILITOT 0.3 05/19/2012 1957       RADIOGRAPHIC STUDIES: I have personally reviewed the radiological images as listed and agreed with the findings in the report. No results found.   ASSESSMENT & PLAN:   # Hereditary hemochromatosis- homozygous [as per Wise] on phlebotomy periodically. Wise does not recollect her last phlebotomy. Today's ferritin is 205. I would recommend Katie ferritin level less than 100.   I would recommend phlebotomy of 300 mL every 12 weeks or so as needed to keep the ferritin less than 100. Wise gets her labs through [facility].    No orders of the defined types were placed in this encounter.   All questions were answered. The Wise knows to call the clinic with any problems, questions or concerns. No barriers to learning was detected. I spent 15 minutes counseling the Wise face to face. The total time spent in the appointment was 30  minutes and more than 50% was on counseling and review of test results     Cammie Sickle, MD 09/18/2015 11:00 AM  he is Katie she

## 2015-12-18 ENCOUNTER — Ambulatory Visit: Payer: Self-pay | Admitting: Internal Medicine

## 2015-12-24 ENCOUNTER — Inpatient Hospital Stay: Payer: Managed Care, Other (non HMO) | Admitting: Internal Medicine

## 2015-12-24 ENCOUNTER — Encounter: Payer: Self-pay | Admitting: *Deleted

## 2015-12-24 NOTE — Progress Notes (Signed)
patient no showed with Dr. Tish Men today on 12/24/15. She also did not get her labs performed at [facility]. We called [facility] and the last labs were performed in October 2016 at [facility]. Msg sent to scheduling to r/s this appointment. Rn asked scheduling to remind patient to obtain her labwork at [facility] prior to md apt.

## 2015-12-31 ENCOUNTER — Other Ambulatory Visit: Payer: Self-pay | Admitting: Physician Assistant

## 2015-12-31 NOTE — Progress Notes (Signed)
Patient came in to get blood drawn per Dr. Lovie Macadamia and Dr. Rogue Bussing.  I drew blood from the right arm without any incident.

## 2016-01-01 LAB — COMPREHENSIVE METABOLIC PANEL
ALT: 15 IU/L (ref 0–32)
AST: 23 IU/L (ref 0–40)
Albumin/Globulin Ratio: 1.7 (ref 1.1–2.5)
Albumin: 4.5 g/dL (ref 3.6–4.8)
Alkaline Phosphatase: 50 IU/L (ref 39–117)
BUN/Creatinine Ratio: 21 (ref 11–26)
BUN: 23 mg/dL (ref 8–27)
Bilirubin Total: 0.4 mg/dL (ref 0.0–1.2)
CO2: 22 mmol/L (ref 18–29)
Calcium: 9.6 mg/dL (ref 8.7–10.3)
Chloride: 103 mmol/L (ref 96–106)
Creatinine, Ser: 1.08 mg/dL — ABNORMAL HIGH (ref 0.57–1.00)
GFR calc Af Amer: 64 mL/min/{1.73_m2} (ref >59)
GFR calc non Af Amer: 56 mL/min/{1.73_m2} — ABNORMAL LOW (ref >59)
Globulin, Total: 2.7 g/dL (ref 1.5–4.5)
Glucose: 89 mg/dL (ref 65–99)
Potassium: 5.5 mmol/L — ABNORMAL HIGH (ref 3.5–5.2)
Sodium: 141 mmol/L (ref 134–144)
Total Protein: 7.2 g/dL (ref 6.0–8.5)

## 2016-01-01 LAB — FERRITIN: Ferritin: 179 ng/mL — ABNORMAL HIGH (ref 15–150)

## 2016-01-01 LAB — CBC WITH DIFFERENTIAL/PLATELET
Basophils Absolute: 0 10*3/uL (ref 0.0–0.2)
Basos: 0 %
EOS (ABSOLUTE): 0.1 10*3/uL (ref 0.0–0.4)
Eos: 3 %
Hematocrit: 32.9 % — ABNORMAL LOW (ref 34.0–46.6)
Hemoglobin: 11.2 g/dL (ref 11.1–15.9)
Immature Grans (Abs): 0 10*3/uL (ref 0.0–0.1)
Immature Granulocytes: 0 %
Lymphocytes Absolute: 1.2 10*3/uL (ref 0.7–3.1)
Lymphs: 27 %
MCH: 32.1 pg (ref 26.6–33.0)
MCHC: 34 g/dL (ref 31.5–35.7)
MCV: 94 fL (ref 79–97)
Monocytes Absolute: 0.4 10*3/uL (ref 0.1–0.9)
Monocytes: 10 %
Neutrophils Absolute: 2.6 10*3/uL (ref 1.4–7.0)
Neutrophils: 60 %
Platelets: 278 10*3/uL (ref 150–379)
RBC: 3.49 x10E6/uL — ABNORMAL LOW (ref 3.77–5.28)
RDW: 13.1 % (ref 12.3–15.4)
WBC: 4.3 10*3/uL (ref 3.4–10.8)

## 2016-01-07 ENCOUNTER — Inpatient Hospital Stay: Payer: Managed Care, Other (non HMO) | Attending: Internal Medicine | Admitting: Internal Medicine

## 2016-01-07 DIAGNOSIS — I10 Essential (primary) hypertension: Secondary | ICD-10-CM | POA: Diagnosis not present

## 2016-01-07 DIAGNOSIS — Z79899 Other long term (current) drug therapy: Secondary | ICD-10-CM

## 2016-01-07 NOTE — Progress Notes (Signed)
Lab results were faxed to Dr. Rogue Bussing at the [facility] and a copy was mailed to the patient per her request.

## 2016-01-07 NOTE — Progress Notes (Signed)
[place] OFFICE PROGRESS NOTE  Patient Care Team: [name], MD as PCP - General (Family Medicine) Dallas Schimke, MD (Internal Medicine) Christene Lye, MD (General Surgery)   SUMMARY OF HEMATOLOGIC-ONCOLOGIC HISTORY:  # 2002- HEREDITARY HEMOCHROMATOSIS HOMOZYGOUS [goal ferritin 100-150]    # 2012- BMbx [sec to Anemia]-Normo-cellular; T Large Granular Lymphocytes by flowcytometry [4.5%] primary vs Reactive; FISH MDS- NEG  # Hx of Stroke [2012; no deficits]     INTERVAL HISTORY:  A very pleasant 60 year old female patient with above history of homozygous hereditary hemochromatosis is here for follow-up; she last had phlebotomy 300 mL 3 months ago.   Patient denies any unusual shortness of breath cough or abdominal distention or swelling in the legs. Denies any skin changes. Denies any weight loss.  REVIEW OF SYSTEMS:  A complete 10 point review of system is done which is negative except mentioned above/history of present illness.    PAST MEDICAL HISTORY :  Past Medical History  Diagnosis Date  . Hypertension   . Hemochromatosis     Homozygous on genetic testing March 2002  . Anemia   . Abnormal CT scan, sinus     VASCULAR ABNORMALITY  . Breast cyst 8+ years     HAD SURGICAL F/U WITH DR Pat Patrick     PAST SURGICAL HISTORY :   Past Surgical History  Procedure Laterality Date  . Colonoscopy  07/2011  . Esophagogastroduodenoscopy  07/2011  . Breast biopsy Right 2011    Negative  . Bone marrow biopsy  01/26/2011    FLOW SHOWS POSITIVE FOR LGL'S, FISH IS NEG,    FAMILY HISTORY :   Family History  Problem Relation Age of Onset  . Ovarian cancer Mother     BRCA status reported negative  . Breast cancer Maternal Aunt 70    SOCIAL HISTORY:   Social History  Substance Use Topics  . Smoking status: Never Smoker   . Smokeless tobacco: Never Used  . Alcohol Use: No    ALLERGIES:  is allergic to amlodipine; hydromorphone; norvasc; and sulfa  antibiotics.  MEDICATIONS:  Current Outpatient Prescriptions  Medication Sig Dispense Refill  . ALAWAY 0.025 % ophthalmic solution     . ALPRAZolam (XANAX) 0.25 MG tablet Take 0.25 mg by mouth at bedtime as needed for anxiety. 0.5 tablet to 1 whole tablet every 12 hours as needed for anxiety    . Biotin 1 MG CAPS Take 1 capsule by mouth 1 day or 1 dose.    . Cholecalciferol 1000 UNITS capsule Take 1,000 Units by mouth 2 (two) times daily.    Marland Kitchen estrogen, conjugated,-medroxyprogesterone (PREMPRO) 0.625-2.5 MG per tablet Take 1 tablet by mouth daily.    Marland Kitchen lisinopril (PRINIVIL,ZESTRIL) 20 MG tablet Take 20 mg by mouth daily.    . SYSTANE ULTRA 0.4-0.3 % SOLN Apply 1 drop to eye daily as needed (dry eyes).     . triamcinolone (NASACORT) 55 MCG/ACT AERO nasal inhaler Place into the nose.    . venlafaxine (EFFEXOR) 75 MG tablet Take 75 mg by mouth daily.      No current facility-administered medications for this visit.    PHYSICAL EXAMINATION: ECOG PERFORMANCE STATUS: 0 - Asymptomatic  BP 115/76 mmHg  Pulse 75  Temp(Src) 97.8 F (36.6 C) (Tympanic)  Ht '5\' 2"'  (1.575 m)  Wt 149 lb 14.6 oz (68 kg)  BMI 27.41 kg/m2  Filed Weights   01/07/16 1443  Weight: 149 lb 14.6 oz (68 kg)  GENERAL: Well-nourished well-developed; Alert, no distress and comfortable.   She is alone. EYES: no pallor or icterus OROPHARYNX: no thrush or ulceration; good dentition  NECK: supple, no masses felt LYMPH:  no palpable lymphadenopathy in the cervical, axillary or inguinal regions LUNGS: clear to auscultation and  No wheeze or crackles HEART/CVS: regular rate & rhythm and no murmurs; No lower extremity edema ABDOMEN:abdomen soft, non-tender and normal bowel sounds Musculoskeletal:no cyanosis of digits and no clubbing  PSYCH: alert & oriented x 3 with fluent speech NEURO: no focal motor/sensory deficits SKIN:  no rashes or significant lesions  LABORATORY DATA:  I have reviewed the data as listed     Component Value Date/Time   NA 141 12/31/2015 1533   NA 142 05/19/2012 1957   K 5.5* 12/31/2015 1533   K 4.6 05/19/2012 1957   CL 103 12/31/2015 1533   CL 108* 05/19/2012 1957   CO2 22 12/31/2015 1533   CO2 26 05/19/2012 1957   GLUCOSE 89 12/31/2015 1533   GLUCOSE 94 05/19/2012 1957   BUN 23 12/31/2015 1533   BUN 10 05/19/2012 1957   CREATININE 1.08* 12/31/2015 1533   CREATININE 1.09 05/19/2012 1957   CALCIUM 9.6 12/31/2015 1533   CALCIUM 9.0 05/19/2012 1957   PROT 7.2 12/31/2015 1533   PROT 6.2* 05/19/2012 1957   ALBUMIN 4.5 12/31/2015 1533   ALBUMIN 3.4 05/19/2012 1957   AST 23 12/31/2015 1533   AST 31 05/19/2012 1957   ALT 15 12/31/2015 1533   ALT 28 05/19/2012 1957   ALKPHOS 50 12/31/2015 1533   ALKPHOS 61 05/19/2012 1957   BILITOT 0.4 12/31/2015 1533   BILITOT 0.3 05/19/2012 1957   GFRNONAA 56* 12/31/2015 1533   GFRNONAA 56* 05/19/2012 1957   GFRNONAA 47* 12/26/2011 1349   GFRAA 64 12/31/2015 1533   GFRAA >60 05/19/2012 1957   GFRAA 56* 12/26/2011 1349    No results found for: SPEP, UPEP  Lab Results  Component Value Date   WBC 4.3 12/31/2015   NEUTROABS 2.6 12/31/2015   HGB 11.6* 01/30/2014   HCT 32.9* 12/31/2015   MCV 94 12/31/2015   PLT 278 12/31/2015      Chemistry      Component Value Date/Time   NA 141 12/31/2015 1533   NA 142 05/19/2012 1957   K 5.5* 12/31/2015 1533   K 4.6 05/19/2012 1957   CL 103 12/31/2015 1533   CL 108* 05/19/2012 1957   CO2 22 12/31/2015 1533   CO2 26 05/19/2012 1957   BUN 23 12/31/2015 1533   BUN 10 05/19/2012 1957   CREATININE 1.08* 12/31/2015 1533   CREATININE 1.09 05/19/2012 1957      Component Value Date/Time   CALCIUM 9.6 12/31/2015 1533   CALCIUM 9.0 05/19/2012 1957   ALKPHOS 50 12/31/2015 1533   ALKPHOS 61 05/19/2012 1957   AST 23 12/31/2015 1533   AST 31 05/19/2012 1957   ALT 15 12/31/2015 1533   ALT 28 05/19/2012 1957   BILITOT 0.4 12/31/2015 1533   BILITOT 0.3 05/19/2012 1957        RADIOGRAPHIC STUDIES: I have personally reviewed the radiological images as listed and agreed with the findings in the report. No results found.   ASSESSMENT & PLAN:   # Hereditary hemochromatosis- homozygous [as per patient] on phlebotomy periodically. Patient's last phlebotomy was in October 2016 [goal ferritin 100-150]. Today's ferritin is 179 I would recommend a ferritin level less than 100. She would have a phlebotomy done next week.  #  Patient follow-up in 3 months/ for repeat phlebotomy. She seems to be tolerating 300 mL phlebotomy every 3 months fairly well. Patient gets her labs through [facility]..  # Slightly high potassium 5.5/asymptomatic. Question related to ACE inhibitor. Recommend follow up with PCP/patient was given a copy of her labs from today.   # 15 minutes face-to-face with the patient discussing the above plan of care; more than 50% of time spent on prognosis/ natural history; counseling and coordination.   No orders of the defined types were placed in this encounter.   All questions were answered. The patient knows to call the clinic with any problems, questions or concerns. No barriers to learning was detected. I spent 15 minutes counseling the patient face to face. The total time spent in the appointment was 30 minutes and more than 50% was on counseling and review of test results     Cammie Sickle, MD 01/07/2016 2:56 PM

## 2016-01-13 ENCOUNTER — Inpatient Hospital Stay: Payer: Managed Care, Other (non HMO) | Attending: Internal Medicine

## 2016-01-13 DIAGNOSIS — Z79899 Other long term (current) drug therapy: Secondary | ICD-10-CM | POA: Insufficient documentation

## 2016-01-14 ENCOUNTER — Other Ambulatory Visit: Payer: Self-pay

## 2016-01-14 DIAGNOSIS — E875 Hyperkalemia: Secondary | ICD-10-CM

## 2016-01-14 NOTE — Progress Notes (Signed)
Patient came in to have blood drawn per Dr. Reuel Boom order.  Blood was drawn from the left arm and wrapped with coban.

## 2016-01-15 LAB — BASIC METABOLIC PANEL
BUN/Creatinine Ratio: 17 (ref 11–26)
BUN: 21 mg/dL (ref 8–27)
CO2: 22 mmol/L (ref 18–29)
Calcium: 10.2 mg/dL (ref 8.7–10.3)
Chloride: 100 mmol/L (ref 96–106)
Creatinine, Ser: 1.25 mg/dL — ABNORMAL HIGH (ref 0.57–1.00)
GFR calc Af Amer: 54 mL/min/{1.73_m2} — ABNORMAL LOW (ref >59)
GFR calc non Af Amer: 47 mL/min/{1.73_m2} — ABNORMAL LOW (ref >59)
Glucose: 85 mg/dL (ref 65–99)
Potassium: 4.4 mmol/L (ref 3.5–5.2)
Sodium: 138 mmol/L (ref 134–144)

## 2016-01-15 LAB — CBC WITH DIFFERENTIAL/PLATELET
Basophils Absolute: 0 10*3/uL (ref 0.0–0.2)
Basos: 0 %
EOS (ABSOLUTE): 0.1 10*3/uL (ref 0.0–0.4)
Eos: 2 %
Hematocrit: 31.5 % — ABNORMAL LOW (ref 34.0–46.6)
Hemoglobin: 10.9 g/dL — ABNORMAL LOW (ref 11.1–15.9)
Immature Grans (Abs): 0 10*3/uL (ref 0.0–0.1)
Immature Granulocytes: 0 %
Lymphocytes Absolute: 1.4 10*3/uL (ref 0.7–3.1)
Lymphs: 28 %
MCH: 32 pg (ref 26.6–33.0)
MCHC: 34.6 g/dL (ref 31.5–35.7)
MCV: 92 fL (ref 79–97)
Monocytes Absolute: 0.4 10*3/uL (ref 0.1–0.9)
Monocytes: 8 %
Neutrophils Absolute: 3 10*3/uL (ref 1.4–7.0)
Neutrophils: 62 %
Platelets: 324 10*3/uL (ref 150–379)
RBC: 3.41 x10E6/uL — ABNORMAL LOW (ref 3.77–5.28)
RDW: 13.3 % (ref 12.3–15.4)
WBC: 4.8 10*3/uL (ref 3.4–10.8)

## 2016-02-02 ENCOUNTER — Other Ambulatory Visit: Payer: Self-pay | Admitting: Physician Assistant

## 2016-02-02 DIAGNOSIS — Z299 Encounter for prophylactic measures, unspecified: Secondary | ICD-10-CM

## 2016-02-02 NOTE — Progress Notes (Signed)
Patient came in to have blood draw per dr. Reuel Boom order.  Blood was drawn from the right arm.

## 2016-02-03 LAB — CBC WITH DIFFERENTIAL/PLATELET
Basophils Absolute: 0 10*3/uL (ref 0.0–0.2)
Basos: 1 %
EOS (ABSOLUTE): 0.1 10*3/uL (ref 0.0–0.4)
Eos: 3 %
Hematocrit: 34 % (ref 34.0–46.6)
Hemoglobin: 11.7 g/dL (ref 11.1–15.9)
Immature Grans (Abs): 0 10*3/uL (ref 0.0–0.1)
Immature Granulocytes: 0 %
Lymphocytes Absolute: 1.3 10*3/uL (ref 0.7–3.1)
Lymphs: 31 %
MCH: 32.7 pg (ref 26.6–33.0)
MCHC: 34.4 g/dL (ref 31.5–35.7)
MCV: 95 fL (ref 79–97)
Monocytes Absolute: 0.3 10*3/uL (ref 0.1–0.9)
Monocytes: 7 %
Neutrophils Absolute: 2.5 10*3/uL (ref 1.4–7.0)
Neutrophils: 58 %
Platelets: 334 10*3/uL (ref 150–379)
RBC: 3.58 x10E6/uL — ABNORMAL LOW (ref 3.77–5.28)
RDW: 13.6 % (ref 12.3–15.4)
WBC: 4.2 10*3/uL (ref 3.4–10.8)

## 2016-02-03 LAB — BASIC METABOLIC PANEL
BUN/Creatinine Ratio: 16 (ref 11–26)
BUN: 22 mg/dL (ref 8–27)
CO2: 18 mmol/L (ref 18–29)
Calcium: 10 mg/dL (ref 8.7–10.3)
Chloride: 99 mmol/L (ref 96–106)
Creatinine, Ser: 1.37 mg/dL — ABNORMAL HIGH (ref 0.57–1.00)
GFR calc Af Amer: 48 mL/min/{1.73_m2} — ABNORMAL LOW (ref >59)
GFR calc non Af Amer: 42 mL/min/{1.73_m2} — ABNORMAL LOW (ref >59)
Glucose: 80 mg/dL (ref 65–99)
Potassium: 4.4 mmol/L (ref 3.5–5.2)
Sodium: 141 mmol/L (ref 134–144)

## 2016-02-05 NOTE — Progress Notes (Signed)
Lab results were faxed to Dr. Lovie Macadamia at [facility] in [place] per patient's request.

## 2016-03-29 IMAGING — MG MM DIGITAL SCREENING BILAT W/ CAD
4 series · 4 of 4 positions shown · non-contrast
Comparison: Previous exam(s).

CLINICAL DATA: Screening.

EXAM:
DIGITAL SCREENING BILATERAL MAMMOGRAM WITH CAD

[R MLO]
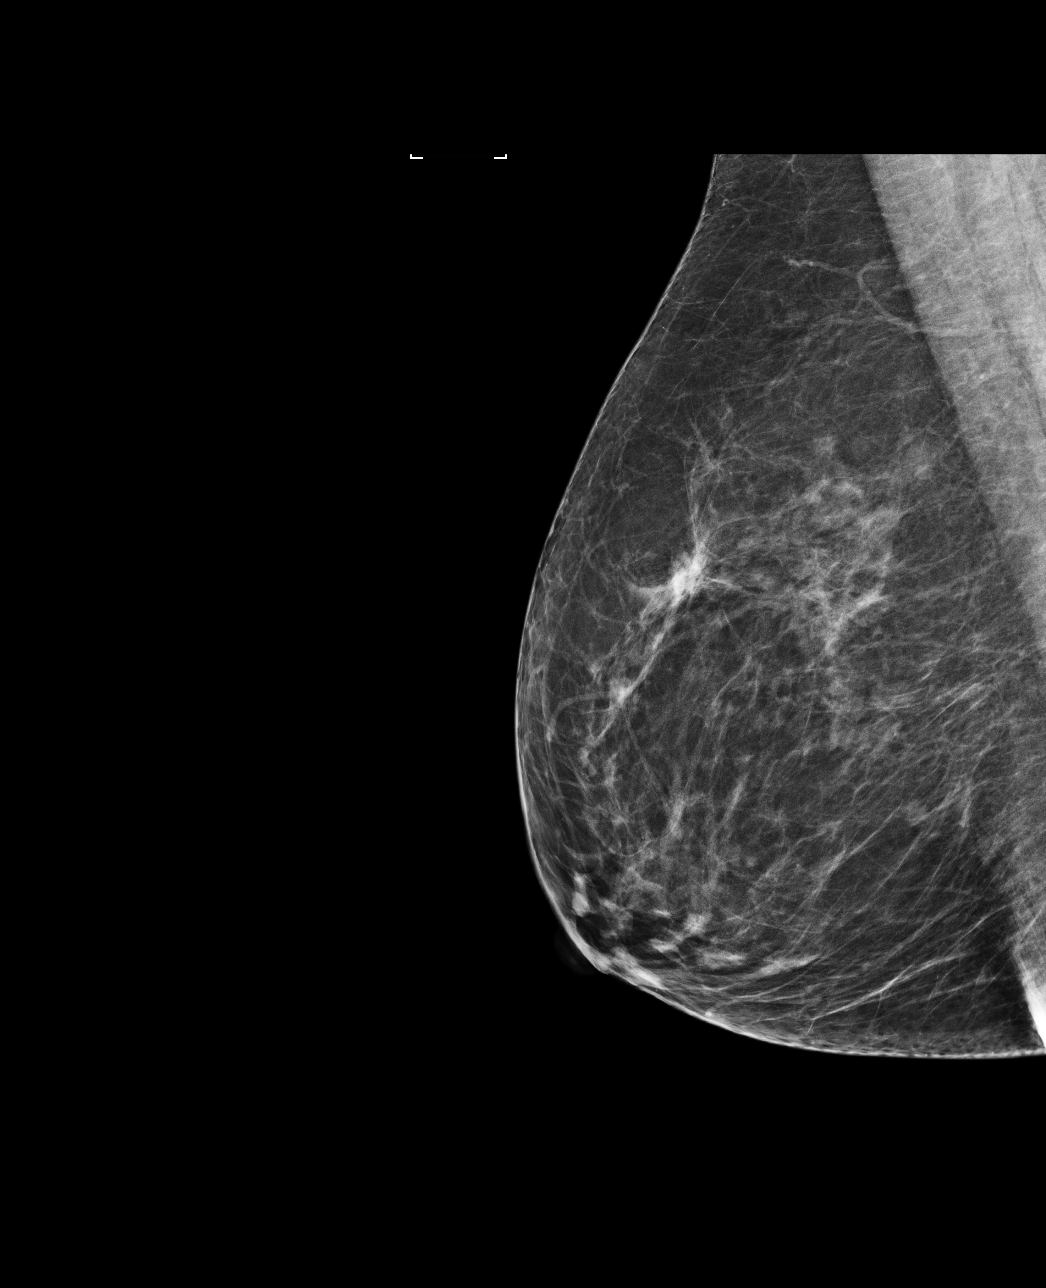

[L MLO]
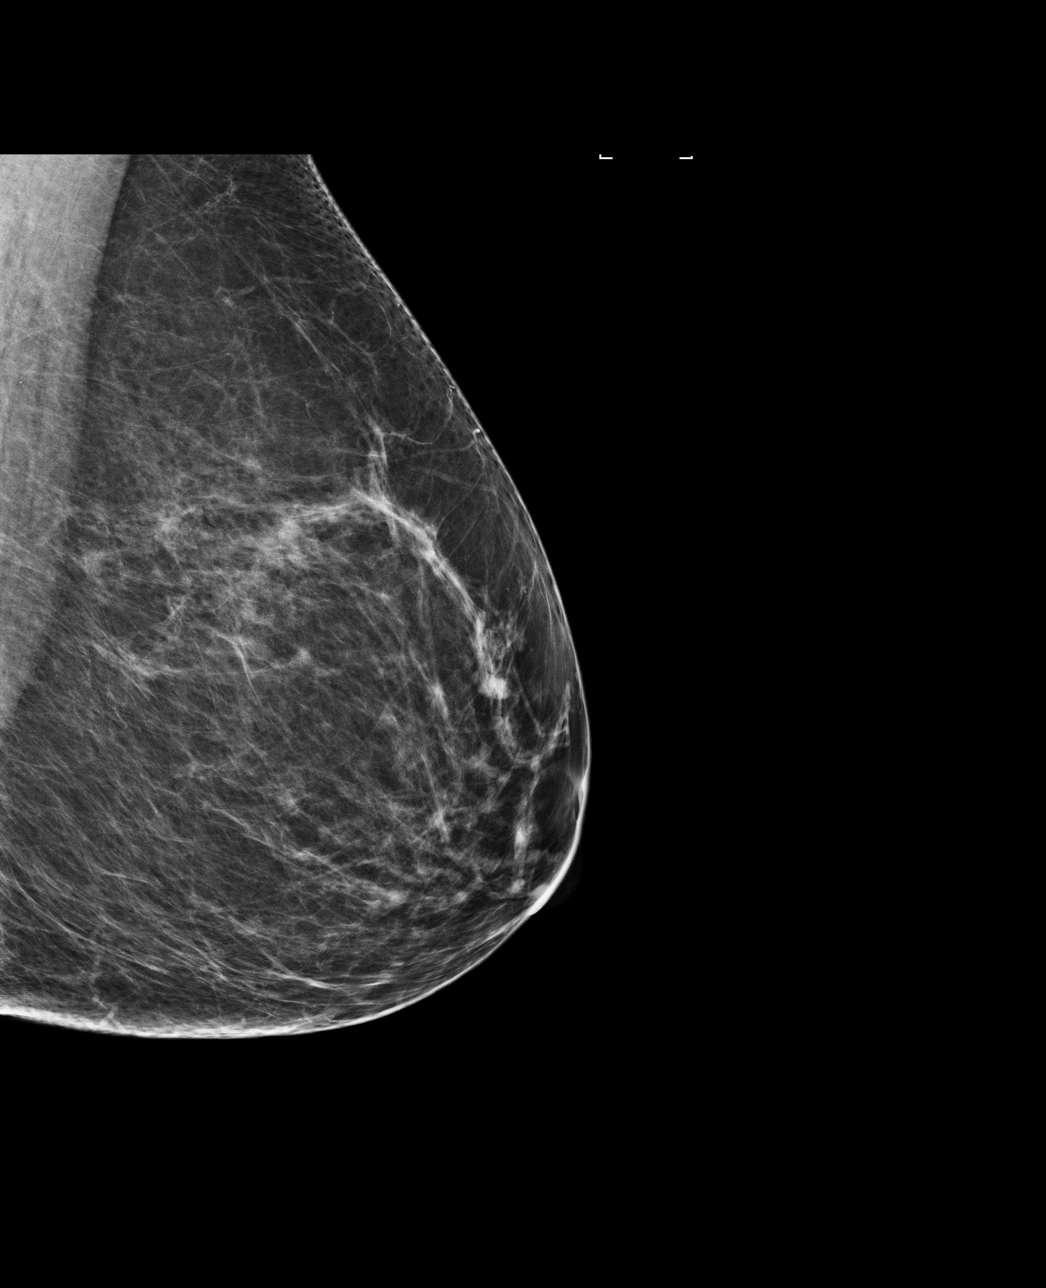

[R CC]
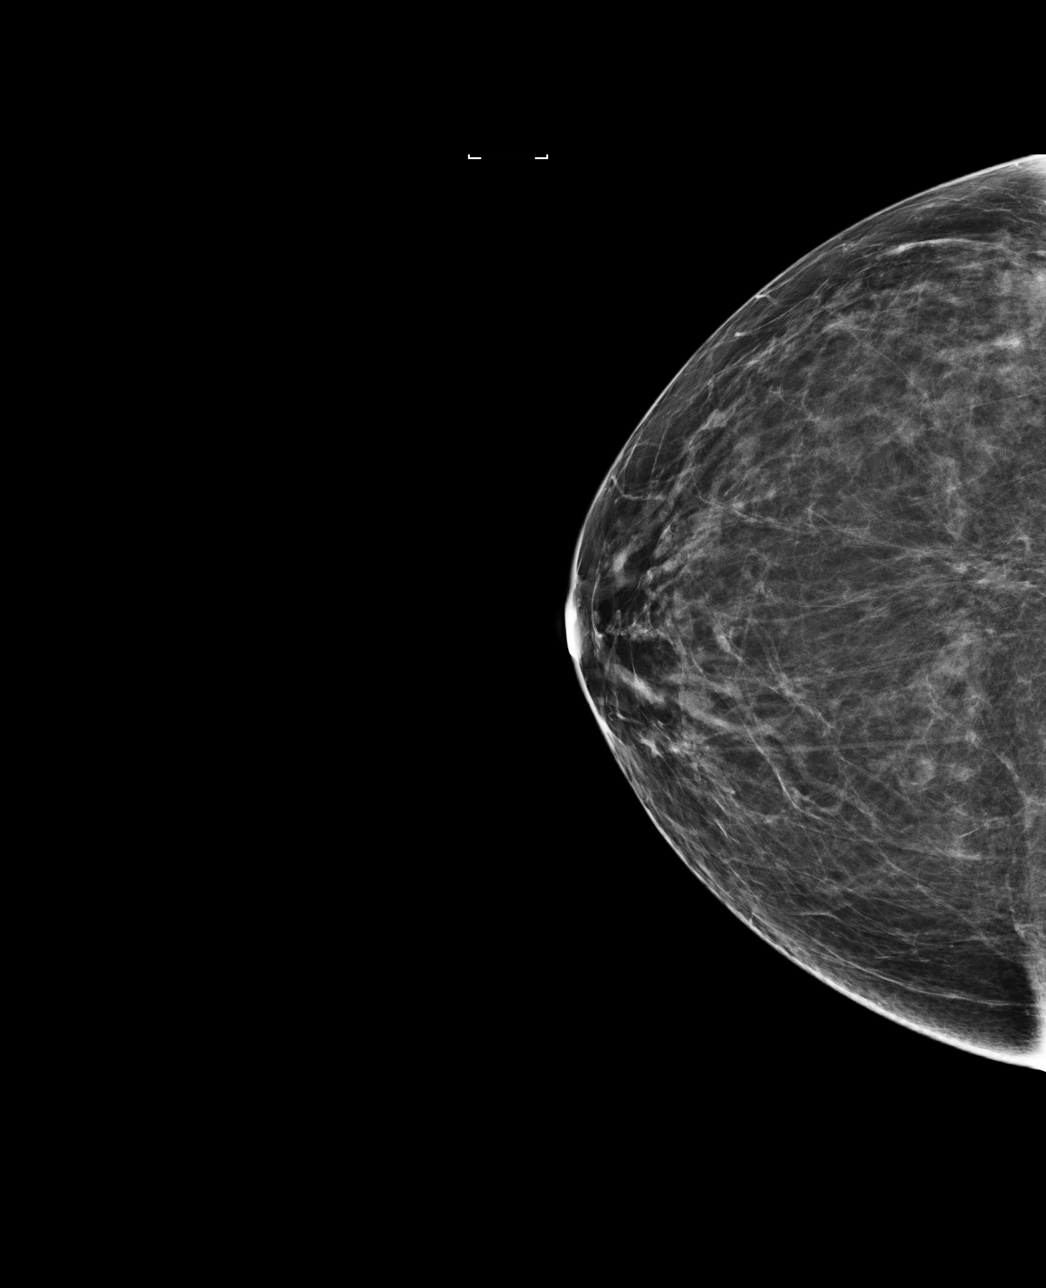

[L CC]
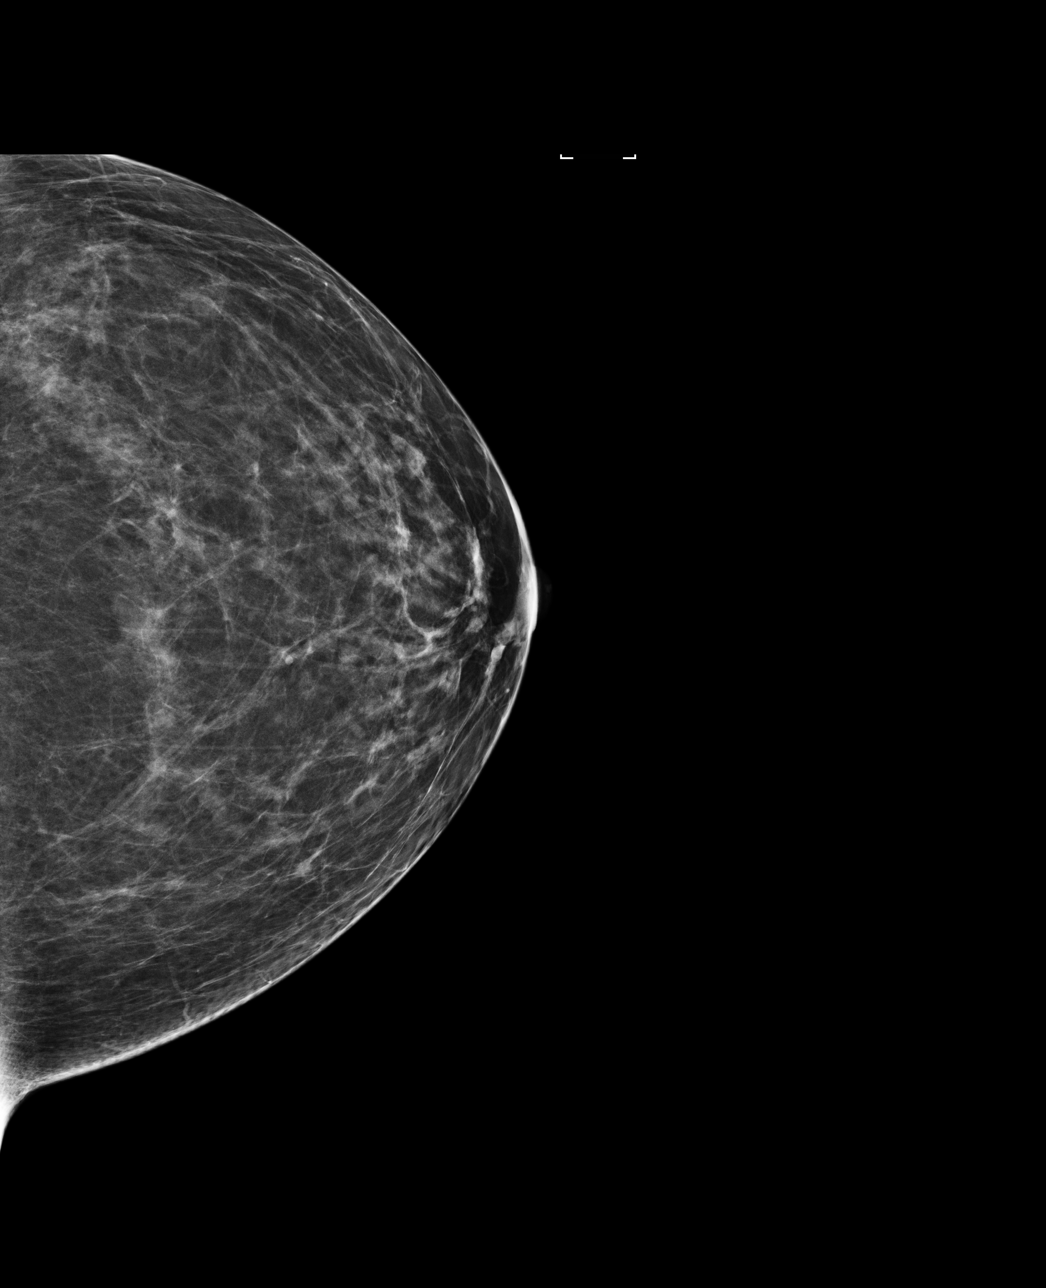

[4 of 4 positions shown; findings below may reference images not displayed]

ACR Breast Density Category c: The breast tissue is heterogeneously
dense, which may obscure small masses.
FINDINGS: There are no findings suspicious for malignancy. Images were
processed with CAD.
IMPRESSION: No mammographic evidence of malignancy. A result letter of this
screening mammogram will be mailed directly to the patient.

RECOMMENDATION:
Screening mammogram in one year. (Code:[id number])

BI-RADS CATEGORY  1: Negative.

## 2016-04-07 ENCOUNTER — Other Ambulatory Visit: Payer: Self-pay

## 2016-04-07 ENCOUNTER — Inpatient Hospital Stay: Payer: Managed Care, Other (non HMO)

## 2016-04-07 ENCOUNTER — Inpatient Hospital Stay: Payer: Managed Care, Other (non HMO) | Admitting: Internal Medicine

## 2016-04-13 ENCOUNTER — Other Ambulatory Visit: Payer: Self-pay | Admitting: Obstetrics and Gynecology

## 2016-04-13 DIAGNOSIS — Z1231 Encounter for screening mammogram for malignant neoplasm of breast: Secondary | ICD-10-CM

## 2016-04-14 ENCOUNTER — Other Ambulatory Visit: Payer: Self-pay

## 2016-04-14 DIAGNOSIS — Z299 Encounter for prophylactic measures, unspecified: Secondary | ICD-10-CM

## 2016-04-14 NOTE — Progress Notes (Signed)
Patient came in to have blood drawn per Dr. Rogue Bussing from the [facility] orders.  Blood was drawn from the right arm without any incident.

## 2016-04-15 LAB — CBC WITH DIFFERENTIAL/PLATELET
Basophils Absolute: 0 10*3/uL (ref 0.0–0.2)
Basos: 1 %
EOS (ABSOLUTE): 0.2 10*3/uL (ref 0.0–0.4)
Eos: 5 %
Hematocrit: 33.7 % — ABNORMAL LOW (ref 34.0–46.6)
Hemoglobin: 11.6 g/dL (ref 11.1–15.9)
Immature Grans (Abs): 0 10*3/uL (ref 0.0–0.1)
Immature Granulocytes: 0 %
Lymphocytes Absolute: 1.1 10*3/uL (ref 0.7–3.1)
Lymphs: 35 %
MCH: 31.6 pg (ref 26.6–33.0)
MCHC: 34.4 g/dL (ref 31.5–35.7)
MCV: 92 fL (ref 79–97)
Monocytes Absolute: 0.3 10*3/uL (ref 0.1–0.9)
Monocytes: 9 %
Neutrophils Absolute: 1.6 10*3/uL (ref 1.4–7.0)
Neutrophils: 50 %
Platelets: 275 10*3/uL (ref 150–379)
RBC: 3.67 x10E6/uL — ABNORMAL LOW (ref 3.77–5.28)
RDW: 12.8 % (ref 12.3–15.4)
WBC: 3.2 10*3/uL — ABNORMAL LOW (ref 3.4–10.8)

## 2016-04-15 LAB — FERRITIN: Ferritin: 112 ng/mL (ref 15–150)

## 2016-04-18 ENCOUNTER — Inpatient Hospital Stay: Payer: Managed Care, Other (non HMO)

## 2016-04-18 ENCOUNTER — Encounter: Payer: Self-pay | Admitting: *Deleted

## 2016-04-18 ENCOUNTER — Inpatient Hospital Stay: Payer: Managed Care, Other (non HMO) | Attending: Internal Medicine | Admitting: Internal Medicine

## 2016-04-18 DIAGNOSIS — Z79899 Other long term (current) drug therapy: Secondary | ICD-10-CM | POA: Diagnosis not present

## 2016-04-18 DIAGNOSIS — D72819 Decreased white blood cell count, unspecified: Secondary | ICD-10-CM

## 2016-04-18 NOTE — Progress Notes (Signed)
[place] OFFICE PROGRESS NOTE  Patient Care Team: [name], MD as PCP - General (Family Medicine) Dallas Schimke, MD (Internal Medicine) Christene Lye, MD (General Surgery)   SUMMARY OF HEMATOLOGIC-ONCOLOGIC HISTORY:  # 2002- HEREDITARY HEMOCHROMATOSIS HOMOZYGOUS [goal ferritin 100-150]    # 2012- BMbx [sec to Anemia]-Normo-cellular; T Large Granular Lymphocytes by flowcytometry [4.5%] primary vs Reactive; FISH MDS- NEG  # Hx of Stroke [2012; no deficits]     INTERVAL HISTORY:  A very pleasant 60 year old female patient with above history of homozygous hereditary hemochromatosis is here for follow-up; she last had phlebotomy 300 mL 3 months ago.   Denies abdominal pain. Denies any swelling in the legs. Denies any skin changes. No weight loss or weight gain. Marland Kitchen  REVIEW OF SYSTEMS: No nausea no vomiting.  A complete 10 point review of system is done which is negative except mentioned above/history of present illness.    PAST MEDICAL HISTORY :  Past Medical History  Diagnosis Date  . Hypertension   . Hemochromatosis     Homozygous on genetic testing March 2002  . Anemia   . Abnormal CT scan, sinus     VASCULAR ABNORMALITY  . Breast cyst 8+ years     HAD SURGICAL F/U WITH DR Pat Patrick     PAST SURGICAL HISTORY :   Past Surgical History  Procedure Laterality Date  . Colonoscopy  07/2011  . Esophagogastroduodenoscopy  07/2011  . Breast biopsy Right 2011    Negative  . Bone marrow biopsy  01/26/2011    FLOW SHOWS POSITIVE FOR LGL'S, FISH IS NEG,    FAMILY HISTORY :   Family History  Problem Relation Age of Onset  . Ovarian cancer Mother     BRCA status reported negative  . Breast cancer Maternal Aunt 70    SOCIAL HISTORY:   Social History  Substance Use Topics  . Smoking status: Never Smoker   . Smokeless tobacco: Never Used  . Alcohol Use: No    ALLERGIES:  is allergic to amlodipine; hydromorphone; norvasc; and sulfa  antibiotics.  MEDICATIONS:  Current Outpatient Prescriptions  Medication Sig Dispense Refill  . ALPRAZolam (XANAX) 0.25 MG tablet Take 0.25 mg by mouth at bedtime as needed for anxiety. 0.5 tablet to 1 whole tablet every 12 hours as needed for anxiety    . Biotin 1 MG CAPS Take 1 capsule by mouth 1 day or 1 dose.    . Cholecalciferol 1000 UNITS capsule Take 1,000 Units by mouth 2 (two) times daily.    Marland Kitchen estradiol (ESTRACE) 1 MG tablet Take by mouth.    Marland Kitchen lisinopril (PRINIVIL,ZESTRIL) 20 MG tablet Take 20 mg by mouth daily.    . progesterone (PROMETRIUM) 100 MG capsule Take by mouth.    . SYSTANE ULTRA 0.4-0.3 % SOLN Apply 1 drop to eye daily as needed (dry eyes).     . traZODone (DESYREL) 50 MG tablet Take by mouth.    . triamcinolone (NASACORT) 55 MCG/ACT AERO nasal inhaler Place into the nose.    . venlafaxine (EFFEXOR) 75 MG tablet Take 75 mg by mouth daily.      No current facility-administered medications for this visit.    PHYSICAL EXAMINATION: ECOG PERFORMANCE STATUS: 0 - Asymptomatic  BP 132/93 mmHg  Pulse 67  Temp(Src) 98.2 F (36.8 C) (Tympanic)  Resp 18  Wt 151 lb 0.2 oz (68.5 kg)  Filed Weights   04/18/16 1344  Weight: 151 lb 0.2 oz (68.5 kg)  GENERAL: Well-nourished well-developed; Alert, no distress and comfortable.   She is alone. EYES: no pallor or icterus OROPHARYNX: no thrush or ulceration; good dentition  NECK: supple, no masses felt LYMPH:  no palpable lymphadenopathy in the cervical, axillary or inguinal regions LUNGS: clear to auscultation and  No wheeze or crackles HEART/CVS: regular rate & rhythm and no murmurs; No lower extremity edema ABDOMEN:abdomen soft, non-tender and normal bowel sounds Musculoskeletal:no cyanosis of digits and no clubbing  PSYCH: alert & oriented x 3 with fluent speech NEURO: no focal motor/sensory deficits SKIN:  no rashes or significant lesions  LABORATORY DATA:  I have reviewed the data as listed    Component Value  Date/Time   NA 141 02/02/2016 1608   NA 142 05/19/2012 1957   K 4.4 02/02/2016 1608   K 4.6 05/19/2012 1957   CL 99 02/02/2016 1608   CL 108* 05/19/2012 1957   CO2 18 02/02/2016 1608   CO2 26 05/19/2012 1957   GLUCOSE 80 02/02/2016 1608   GLUCOSE 94 05/19/2012 1957   BUN 22 02/02/2016 1608   BUN 10 05/19/2012 1957   CREATININE 1.37* 02/02/2016 1608   CREATININE 1.09 05/19/2012 1957   CALCIUM 10.0 02/02/2016 1608   CALCIUM 9.0 05/19/2012 1957   PROT 7.2 12/31/2015 1533   PROT 6.2* 05/19/2012 1957   ALBUMIN 4.5 12/31/2015 1533   ALBUMIN 3.4 05/19/2012 1957   AST 23 12/31/2015 1533   AST 31 05/19/2012 1957   ALT 15 12/31/2015 1533   ALT 28 05/19/2012 1957   ALKPHOS 50 12/31/2015 1533   ALKPHOS 61 05/19/2012 1957   BILITOT 0.4 12/31/2015 1533   BILITOT 0.3 05/19/2012 1957   GFRNONAA 42* 02/02/2016 1608   GFRNONAA 56* 05/19/2012 1957   GFRNONAA 47* 12/26/2011 1349   GFRAA 48* 02/02/2016 1608   GFRAA >60 05/19/2012 1957   GFRAA 56* 12/26/2011 1349    No results found for: SPEP, UPEP  Lab Results  Component Value Date   WBC 3.2* 04/14/2016   NEUTROABS 1.6 04/14/2016   HGB 11.6* 01/30/2014   HCT 33.7* 04/14/2016   MCV 92 04/14/2016   PLT 275 04/14/2016      Chemistry      Component Value Date/Time   NA 141 02/02/2016 1608   NA 142 05/19/2012 1957   K 4.4 02/02/2016 1608   K 4.6 05/19/2012 1957   CL 99 02/02/2016 1608   CL 108* 05/19/2012 1957   CO2 18 02/02/2016 1608   CO2 26 05/19/2012 1957   BUN 22 02/02/2016 1608   BUN 10 05/19/2012 1957   CREATININE 1.37* 02/02/2016 1608   CREATININE 1.09 05/19/2012 1957      Component Value Date/Time   CALCIUM 10.0 02/02/2016 1608   CALCIUM 9.0 05/19/2012 1957   ALKPHOS 50 12/31/2015 1533   ALKPHOS 61 05/19/2012 1957   AST 23 12/31/2015 1533   AST 31 05/19/2012 1957   ALT 15 12/31/2015 1533   ALT 28 05/19/2012 1957   BILITOT 0.4 12/31/2015 1533   BILITOT 0.3 05/19/2012 1957        ASSESSMENT & PLAN:   #  Hereditary hemochromatosis- homozygous [as per patient] on phlebotomy Every 3 months- ferritin <100. Today ferritin is 117. No evidence of end organ dysfunction. Proceed with phlebotomy.  # Patient follow-up in 3 months/ for repeat phlebotomy. She seems to be tolerating 300 mL phlebotomy every 3 months fairly well. Patient gets her labs through [facility].  # mild leukopenia white count 3.2 normal  3.4 normal differential. Patient had previous intermittent leukopenia. Previous history of bone marrow biopsy.  # 15 minutes face-to-face with the patient discussing the above plan of care; more than 50% of time spent on prognosis/ natural history; counseling and coordination.      Cammie Sickle, MD 04/18/2016 1:58 PM

## 2016-04-19 ENCOUNTER — Inpatient Hospital Stay: Payer: Managed Care, Other (non HMO)

## 2016-05-18 ENCOUNTER — Ambulatory Visit
Admission: RE | Admit: 2016-05-18 | Discharge: 2016-05-18 | Disposition: A | Discharge status: Home / Self Care | Payer: Managed Care, Other (non HMO) | Source: Ambulatory Visit | Attending: Obstetrics and Gynecology | Admitting: Obstetrics and Gynecology

## 2016-05-18 DIAGNOSIS — Z1231 Encounter for screening mammogram for malignant neoplasm of breast: Secondary | ICD-10-CM | POA: Insufficient documentation

## 2016-07-13 ENCOUNTER — Other Ambulatory Visit: Payer: Self-pay | Admitting: Physician Assistant

## 2016-07-13 DIAGNOSIS — R7989 Other specified abnormal findings of blood chemistry: Secondary | ICD-10-CM

## 2016-07-13 NOTE — Progress Notes (Signed)
Patient came in to have blood drawn for testing per Dr. Aletha Halim orders.

## 2016-07-14 LAB — CBC WITH DIFFERENTIAL/PLATELET
Basophils Absolute: 0 10*3/uL (ref 0.0–0.2)
Basos: 1 %
EOS (ABSOLUTE): 0.2 10*3/uL (ref 0.0–0.4)
Eos: 5 %
Hematocrit: 35 % (ref 34.0–46.6)
Hemoglobin: 11.9 g/dL (ref 11.1–15.9)
Immature Grans (Abs): 0 10*3/uL (ref 0.0–0.1)
Immature Granulocytes: 0 %
Lymphocytes Absolute: 1.3 10*3/uL (ref 0.7–3.1)
Lymphs: 35 %
MCH: 32.5 pg (ref 26.6–33.0)
MCHC: 34 g/dL (ref 31.5–35.7)
MCV: 96 fL (ref 79–97)
Monocytes Absolute: 0.4 10*3/uL (ref 0.1–0.9)
Monocytes: 11 %
Neutrophils Absolute: 1.8 10*3/uL (ref 1.4–7.0)
Neutrophils: 48 %
Platelets: 271 10*3/uL (ref 150–379)
RBC: 3.66 x10E6/uL — ABNORMAL LOW (ref 3.77–5.28)
RDW: 13.3 % (ref 12.3–15.4)
WBC: 3.8 10*3/uL (ref 3.4–10.8)

## 2016-07-14 LAB — FERRITIN: Ferritin: 114 ng/mL (ref 15–150)

## 2016-07-18 ENCOUNTER — Inpatient Hospital Stay: Payer: Managed Care, Other (non HMO) | Attending: Internal Medicine

## 2016-07-18 DIAGNOSIS — Z79899 Other long term (current) drug therapy: Secondary | ICD-10-CM | POA: Insufficient documentation

## 2016-07-18 DIAGNOSIS — D72819 Decreased white blood cell count, unspecified: Secondary | ICD-10-CM | POA: Diagnosis not present

## 2016-09-19 ENCOUNTER — Encounter: Payer: Self-pay | Admitting: Internal Medicine

## 2016-10-10 ENCOUNTER — Other Ambulatory Visit: Payer: Self-pay

## 2016-10-10 DIAGNOSIS — Z299 Encounter for prophylactic measures, unspecified: Secondary | ICD-10-CM

## 2016-10-10 NOTE — Progress Notes (Signed)
Patient came in to have blood drawn for testing per Dr. Patrecia Pace and Dr. Reuel Boom orders.

## 2016-10-11 LAB — IRON AND TIBC
Iron Saturation: 63 % — ABNORMAL HIGH (ref 15–55)
Iron: 201 ug/dL — ABNORMAL HIGH (ref 27–139)
Total Iron Binding Capacity: 317 ug/dL (ref 250–450)
UIBC: 116 ug/dL — ABNORMAL LOW (ref 118–369)

## 2016-10-11 LAB — CBC WITH DIFFERENTIAL/PLATELET
Basophils Absolute: 0 10*3/uL (ref 0.0–0.2)
Basos: 0 %
EOS (ABSOLUTE): 0.2 10*3/uL (ref 0.0–0.4)
Eos: 4 %
Hematocrit: 35.1 % (ref 34.0–46.6)
Hemoglobin: 12.2 g/dL (ref 11.1–15.9)
Immature Grans (Abs): 0 10*3/uL (ref 0.0–0.1)
Immature Granulocytes: 0 %
Lymphocytes Absolute: 1.2 10*3/uL (ref 0.7–3.1)
Lymphs: 32 %
MCH: 31.9 pg (ref 26.6–33.0)
MCHC: 34.8 g/dL (ref 31.5–35.7)
MCV: 92 fL (ref 79–97)
Monocytes Absolute: 0.3 10*3/uL (ref 0.1–0.9)
Monocytes: 9 %
Neutrophils Absolute: 2 10*3/uL (ref 1.4–7.0)
Neutrophils: 55 %
Platelets: 271 10*3/uL (ref 150–379)
RBC: 3.82 x10E6/uL (ref 3.77–5.28)
RDW: 12.6 % (ref 12.3–15.4)
WBC: 3.7 10*3/uL (ref 3.4–10.8)

## 2016-10-11 LAB — THYROID PANEL WITH TSH
Free Thyroxine Index: 1.7 (ref 1.2–4.9)
T3 Uptake Ratio: 30 % (ref 24–39)
T4, Total: 5.5 ug/dL (ref 4.5–12.0)
TSH: 1.69 u[IU]/mL (ref 0.450–4.500)

## 2016-10-11 LAB — FERRITIN: Ferritin: 86 ng/mL (ref 15–150)

## 2016-10-17 ENCOUNTER — Inpatient Hospital Stay: Payer: Managed Care, Other (non HMO) | Attending: Internal Medicine | Admitting: Internal Medicine

## 2016-10-17 ENCOUNTER — Inpatient Hospital Stay: Payer: Managed Care, Other (non HMO)

## 2016-10-17 DIAGNOSIS — I10 Essential (primary) hypertension: Secondary | ICD-10-CM | POA: Diagnosis not present

## 2016-10-17 DIAGNOSIS — D72819 Decreased white blood cell count, unspecified: Secondary | ICD-10-CM | POA: Diagnosis not present

## 2016-10-17 DIAGNOSIS — Z79899 Other long term (current) drug therapy: Secondary | ICD-10-CM | POA: Insufficient documentation

## 2016-10-17 DIAGNOSIS — Z7982 Long term (current) use of aspirin: Secondary | ICD-10-CM | POA: Insufficient documentation

## 2016-10-17 NOTE — Assessment & Plan Note (Signed)
#  Hereditary hemochromatosis- homozygous [as per patient] on phlebotomy Every 3 months- ferritin <100. Today ferritin is 63;sat 66% . No evidence of end organ dysfunction. Proceed with phlebotomy today.   # Patient follow-up in 6 months/ for repeat phlebotomy; labs 1 week prior; Patient gets her labs through [facility].  # mild leukopenia white count-  Patient had previous intermittent leukopenia. Previous history of bone marrow biopsy. 

## 2016-10-17 NOTE — Progress Notes (Signed)
Patient is here for follow up,  

## 2016-10-17 NOTE — Progress Notes (Signed)
[place] OFFICE PROGRESS NOTE  Patient Care Team: [name], MD as PCP - General (Family Medicine) Dallas Schimke, MD (Internal Medicine) Christene Lye, MD (General Surgery)   SUMMARY OF HEMATOLOGIC-ONCOLOGIC HISTORY:  # 2002- HEREDITARY HEMOCHROMATOSIS HOMOZYGOUS [goal ferritin 100-150]    # 2012- BMbx [sec to Anemia]-Normo-cellular; T Large Granular Lymphocytes by flowcytometry [4.5%] primary vs Reactive; FISH MDS- NEG  # Hx of Stroke [2012; no deficits]     INTERVAL HISTORY:  A very pleasant 60 year old female patient with above history of homozygous hereditary hemochromatosis is here for follow-up.   Denies abdominal pain. Denies any swelling in the legs. Denies any skin changes. No weight loss or weight gain. Marland Kitchen  REVIEW OF SYSTEMS: No nausea no vomiting.  A complete 10 point review of system is done which is negative except mentioned above/history of present illness.    PAST MEDICAL HISTORY :  Past Medical History:  Diagnosis Date  . Abnormal CT scan, sinus    VASCULAR ABNORMALITY  . Anemia   . Breast cyst 8+ years    HAD SURGICAL F/U WITH DR Pat Patrick   . Elevated ferritin level   . Hemochromatosis    Homozygous on genetic testing March 2002  . Hypertension     PAST SURGICAL HISTORY :   Past Surgical History:  Procedure Laterality Date  . BONE MARROW BIOPSY  01/26/2011   FLOW SHOWS POSITIVE FOR LGL'S, FISH IS NEG,  . BREAST EXCISIONAL BIOPSY Right 2011   Negative  . COLONOSCOPY  07/2011  . ESOPHAGOGASTRODUODENOSCOPY  07/2011    FAMILY HISTORY :   Family History  Problem Relation Age of Onset  . Ovarian cancer Mother     BRCA status reported negative  . Breast cancer Maternal Aunt 70    SOCIAL HISTORY:   Social History  Substance Use Topics  . Smoking status: Never Smoker  . Smokeless tobacco: Never Used  . Alcohol use No    ALLERGIES:  is allergic to amlodipine; hydromorphone; norvasc [amlodipine besylate]; and sulfa  antibiotics.  MEDICATIONS:  Current Outpatient Prescriptions  Medication Sig Dispense Refill  . ALPRAZolam (XANAX) 0.25 MG tablet Take 0.25 mg by mouth at bedtime as needed for anxiety. 0.5 tablet to 1 whole tablet every 12 hours as needed for anxiety    . aspirin 81 MG chewable tablet Chew by mouth.    . Biotin 1 MG CAPS Take 1 capsule by mouth 1 day or 1 dose.    . Cholecalciferol 1000 UNITS capsule Take 1,000 Units by mouth 2 (two) times daily.    Marland Kitchen lisinopril (PRINIVIL,ZESTRIL) 20 MG tablet Take 20 mg by mouth daily.    Marland Kitchen MELATONIN GUMMIES PO Take 2 each by mouth at bedtime.    . triamcinolone (NASACORT) 55 MCG/ACT AERO nasal inhaler Place into the nose.    . venlafaxine (EFFEXOR) 75 MG tablet Take 75 mg by mouth daily.      No current facility-administered medications for this visit.     PHYSICAL EXAMINATION: ECOG PERFORMANCE STATUS: 0 - Asymptomatic  BP 123/85 (BP Location: Left Arm, Patient Position: Sitting)   Pulse 69   Temp 97.5 F (36.4 C) (Tympanic)   Resp 18   Wt 157 lb (71.2 kg)   BMI 28.72 kg/m   Filed Weights   10/17/16 1415  Weight: 157 lb (71.2 kg)    GENERAL: Well-nourished well-developed; Alert, no distress and comfortable.   She is alone. EYES: no pallor or icterus OROPHARYNX: no  thrush or ulceration; good dentition  NECK: supple, no masses felt LYMPH:  no palpable lymphadenopathy in the cervical, axillary or inguinal regions LUNGS: clear to auscultation and  No wheeze or crackles HEART/CVS: regular rate & rhythm and no murmurs; No lower extremity edema ABDOMEN:abdomen soft, non-tender and normal bowel sounds Musculoskeletal:no cyanosis of digits and no clubbing  PSYCH: alert & oriented x 3 with fluent speech NEURO: no focal motor/sensory deficits SKIN:  no rashes or significant lesions  LABORATORY DATA:  I have reviewed the data as listed    Component Value Date/Time   NA 141 02/02/2016 1608   NA 142 05/19/2012 1957   K 4.4 02/02/2016 1608    K 4.6 05/19/2012 1957   CL 99 02/02/2016 1608   CL 108 (H) 05/19/2012 1957   CO2 18 02/02/2016 1608   CO2 26 05/19/2012 1957   GLUCOSE 80 02/02/2016 1608   GLUCOSE 94 05/19/2012 1957   BUN 22 02/02/2016 1608   BUN 10 05/19/2012 1957   CREATININE 1.37 (H) 02/02/2016 1608   CREATININE 1.09 05/19/2012 1957   CALCIUM 10.0 02/02/2016 1608   CALCIUM 9.0 05/19/2012 1957   PROT 7.2 12/31/2015 1533   PROT 6.2 (L) 05/19/2012 1957   ALBUMIN 4.5 12/31/2015 1533   ALBUMIN 3.4 05/19/2012 1957   AST 23 12/31/2015 1533   AST 31 05/19/2012 1957   ALT 15 12/31/2015 1533   ALT 28 05/19/2012 1957   ALKPHOS 50 12/31/2015 1533   ALKPHOS 61 05/19/2012 1957   BILITOT 0.4 12/31/2015 1533   BILITOT 0.3 05/19/2012 1957   GFRNONAA 42 (L) 02/02/2016 1608   GFRNONAA 56 (L) 05/19/2012 1957   GFRAA 48 (L) 02/02/2016 1608   GFRAA >60 05/19/2012 1957    No results found for: SPEP, UPEP  Lab Results  Component Value Date   WBC 3.7 10/10/2016   NEUTROABS 2.0 10/10/2016   HGB 11.6 (L) 01/30/2014   HCT 35.1 10/10/2016   MCV 92 10/10/2016   PLT 271 10/10/2016      Chemistry      Component Value Date/Time   NA 141 02/02/2016 1608   NA 142 05/19/2012 1957   K 4.4 02/02/2016 1608   K 4.6 05/19/2012 1957   CL 99 02/02/2016 1608   CL 108 (H) 05/19/2012 1957   CO2 18 02/02/2016 1608   CO2 26 05/19/2012 1957   BUN 22 02/02/2016 1608   BUN 10 05/19/2012 1957   CREATININE 1.37 (H) 02/02/2016 1608   CREATININE 1.09 05/19/2012 1957      Component Value Date/Time   CALCIUM 10.0 02/02/2016 1608   CALCIUM 9.0 05/19/2012 1957   ALKPHOS 50 12/31/2015 1533   ALKPHOS 61 05/19/2012 1957   AST 23 12/31/2015 1533   AST 31 05/19/2012 1957   ALT 15 12/31/2015 1533   ALT 28 05/19/2012 1957   BILITOT 0.4 12/31/2015 1533   BILITOT 0.3 05/19/2012 1957        ASSESSMENT & PLAN:   Hemochromatosis, hereditary ([place]) # Hereditary hemochromatosis- homozygous [as per patient] on phlebotomy Every 3 months-  ferritin <100. Today ferritin is 63;sat 66% . No evidence of end organ dysfunction. Proceed with phlebotomy today.   # Patient follow-up in 6 months/ for repeat phlebotomy; labs 1 week prior; Patient gets her labs through [facility].  # mild leukopenia white count-  Patient had previous intermittent leukopenia. Previous history of bone marrow biopsy.    Cammie Sickle, MD 10/18/2016 6:02 PM

## 2016-12-06 ENCOUNTER — Encounter: Payer: Self-pay | Admitting: Physician Assistant

## 2016-12-06 ENCOUNTER — Ambulatory Visit: Payer: Self-pay | Admitting: Physician Assistant

## 2016-12-06 VITALS — BP 121/74 | HR 101 | Temp 99.7°F

## 2016-12-06 DIAGNOSIS — J069 Acute upper respiratory infection, unspecified: Secondary | ICD-10-CM

## 2016-12-06 MED ORDER — FLUCONAZOLE 150 MG PO TABS: 150.0000 mg | ORAL_TABLET | Freq: Once | ORAL | 0 refills | Status: AC

## 2016-12-06 MED ORDER — CEFDINIR 300 MG PO CAPS: 300.0000 mg | ORAL_CAPSULE | Freq: Two times a day (BID) | ORAL | 0 refills | Status: DC

## 2016-12-06 NOTE — Progress Notes (Signed)
S: C/o runny nose and congestion for 5 days, + fever, chills, denies cp/sob, v/d; mucus was green this am but clear throughout the day, cough is sporadic, grandchildren are sick and have been staying with her  Using otc meds: robitussin  O: PE: vitals wnl, nad, perrl eomi, normocephalic, tms dull, nasal mucosa red and swollen, throat injected, neck supple no lymph, lungs c t a, cv rrr, neuro intact  A:  Acute uri   P: drink fluids, continue regular meds , use otc meds of choice, return if not improving in 5 days, return earlier if worsening , omnicef, diflucan

## 2017-04-04 ENCOUNTER — Other Ambulatory Visit: Payer: Self-pay

## 2017-04-04 DIAGNOSIS — Z299 Encounter for prophylactic measures, unspecified: Secondary | ICD-10-CM

## 2017-04-04 NOTE — Progress Notes (Signed)
Patient came in to have blood drawn for testing per Dr. Rogue Bussing and Dr. Reuel Boom orders.

## 2017-04-05 LAB — CMP12+LP+TP+TSH+6AC+CBC/D/PLT
ALT: 16 IU/L (ref 0–32)
AST: 23 IU/L (ref 0–40)
Albumin/Globulin Ratio: 1.7 (ref 1.2–2.2)
Albumin: 4.5 g/dL (ref 3.6–4.8)
Alkaline Phosphatase: 69 IU/L (ref 39–117)
BUN/Creatinine Ratio: 24 (ref 12–28)
BUN: 24 mg/dL (ref 8–27)
Basophils Absolute: 0 10*3/uL (ref 0.0–0.2)
Basos: 0 %
Bilirubin Total: 0.3 mg/dL (ref 0.0–1.2)
Calcium: 9.9 mg/dL (ref 8.7–10.3)
Chloride: 104 mmol/L (ref 96–106)
Chol/HDL Ratio: 3.6 ratio (ref 0.0–4.4)
Cholesterol, Total: 256 mg/dL — ABNORMAL HIGH (ref 100–199)
Creatinine, Ser: 1 mg/dL (ref 0.57–1.00)
EOS (ABSOLUTE): 0.2 10*3/uL (ref 0.0–0.4)
Eos: 5 %
Estimated CHD Risk: 0.6 times avg. (ref 0.0–1.0)
Free Thyroxine Index: 2 (ref 1.2–4.9)
GFR calc Af Amer: 70 mL/min/{1.73_m2} (ref >59)
GFR calc non Af Amer: 61 mL/min/{1.73_m2} (ref >59)
GGT: 19 IU/L (ref 0–60)
Globulin, Total: 2.6 g/dL (ref 1.5–4.5)
Glucose: 92 mg/dL (ref 65–99)
HDL: 71 mg/dL (ref >39)
Hematocrit: 33.9 % — ABNORMAL LOW (ref 34.0–46.6)
Hemoglobin: 11.6 g/dL (ref 11.1–15.9)
Immature Grans (Abs): 0 10*3/uL (ref 0.0–0.1)
Immature Granulocytes: 0 %
Iron: 220 ug/dL — ABNORMAL HIGH (ref 27–139)
LDH: 199 IU/L (ref 119–226)
LDL Calculated: 138 mg/dL — ABNORMAL HIGH (ref 0–99)
Lymphocytes Absolute: 1.1 10*3/uL (ref 0.7–3.1)
Lymphs: 37 %
MCH: 32 pg (ref 26.6–33.0)
MCHC: 34.2 g/dL (ref 31.5–35.7)
MCV: 93 fL (ref 79–97)
Monocytes Absolute: 0.3 10*3/uL (ref 0.1–0.9)
Monocytes: 9 %
Neutrophils Absolute: 1.4 10*3/uL (ref 1.4–7.0)
Neutrophils: 49 %
Phosphorus: 2.9 mg/dL (ref 2.5–4.5)
Platelets: 283 10*3/uL (ref 150–379)
Potassium: 4.7 mmol/L (ref 3.5–5.2)
RBC: 3.63 x10E6/uL — ABNORMAL LOW (ref 3.77–5.28)
RDW: 13.3 % (ref 12.3–15.4)
Sodium: 141 mmol/L (ref 134–144)
T3 Uptake Ratio: 27 % (ref 24–39)
T4, Total: 7.4 ug/dL (ref 4.5–12.0)
TSH: 0.665 u[IU]/mL (ref 0.450–4.500)
Total Protein: 7.1 g/dL (ref 6.0–8.5)
Triglycerides: 235 mg/dL — ABNORMAL HIGH (ref 0–149)
Uric Acid: 5.3 mg/dL (ref 2.5–7.1)
VLDL Cholesterol Cal: 47 mg/dL — ABNORMAL HIGH (ref 5–40)
WBC: 2.9 10*3/uL — ABNORMAL LOW (ref 3.4–10.8)

## 2017-04-05 LAB — T4, FREE: Free T4: 1.22 ng/dL (ref 0.82–1.77)

## 2017-04-05 LAB — IRON AND TIBC
Iron Saturation: 76 % (ref 15–55)
Total Iron Binding Capacity: 290 ug/dL (ref 250–450)
UIBC: 70 ug/dL — ABNORMAL LOW (ref 118–369)

## 2017-04-05 LAB — FERRITIN: Ferritin: 107 ng/mL (ref 15–150)

## 2017-04-17 ENCOUNTER — Inpatient Hospital Stay: Payer: Managed Care, Other (non HMO) | Attending: Internal Medicine | Admitting: Internal Medicine

## 2017-04-17 ENCOUNTER — Inpatient Hospital Stay: Payer: Managed Care, Other (non HMO)

## 2017-04-17 VITALS — BP 103/74 | HR 100 | Resp 18

## 2017-04-17 DIAGNOSIS — D72819 Decreased white blood cell count, unspecified: Secondary | ICD-10-CM

## 2017-04-17 DIAGNOSIS — Z7982 Long term (current) use of aspirin: Secondary | ICD-10-CM | POA: Insufficient documentation

## 2017-04-17 DIAGNOSIS — I10 Essential (primary) hypertension: Secondary | ICD-10-CM | POA: Diagnosis not present

## 2017-04-17 DIAGNOSIS — Z79899 Other long term (current) drug therapy: Secondary | ICD-10-CM

## 2017-04-17 DIAGNOSIS — Z8673 Personal history of transient ischemic attack (TIA), and cerebral infarction without residual deficits: Secondary | ICD-10-CM

## 2017-04-17 DIAGNOSIS — R9402 Abnormal brain scan: Secondary | ICD-10-CM

## 2017-04-17 NOTE — Progress Notes (Signed)
[place] OFFICE PROGRESS NOTE  Patient Care Team: [name], MD as PCP - General (Family Medicine) Inez Pilgrim Simonne Come, MD (Internal Medicine) Christene Lye, MD (General Surgery)   SUMMARY OF HEMATOLOGIC-ONCOLOGIC HISTORY:  # 2002- HEREDITARY HEMOCHROMATOSIS HOMOZYGOUS [goal ferritin 100-150]    # 2012- BMbx [sec to Anemia]-Normo-cellular; T Large Granular Lymphocytes by flowcytometry [4.5%] primary vs Reactive; FISH MDS- NEG  # Hx of Stroke [2012; no deficits]     INTERVAL HISTORY:  A very pleasant 61 year old female patient with above history of homozygous hereditary hemochromatosis is here for follow-up.   Denies abdominal pain. Denies any swelling in the legs. Denies any skin changes. No weight loss or weight gain. No fever no chills.  REVIEW OF SYSTEMS: No nausea no vomiting.  A complete 10 point review of system is done which is negative except mentioned above/history of present illness.    PAST MEDICAL HISTORY :  Past Medical History:  Diagnosis Date  . Abnormal CT scan, sinus    VASCULAR ABNORMALITY  . Anemia   . Breast cyst 8+ years    HAD SURGICAL F/U WITH DR Pat Patrick   . Elevated ferritin level   . Hemochromatosis    Homozygous on genetic testing March 2002  . Hypertension     PAST SURGICAL HISTORY :   Past Surgical History:  Procedure Laterality Date  . BONE MARROW BIOPSY  01/26/2011   FLOW SHOWS POSITIVE FOR LGL'S, FISH IS NEG,  . BREAST EXCISIONAL BIOPSY Right 2011   Negative  . COLONOSCOPY  07/2011  . ESOPHAGOGASTRODUODENOSCOPY  07/2011    FAMILY HISTORY :   Family History  Problem Relation Age of Onset  . Ovarian cancer Mother     BRCA status reported negative  . Breast cancer Maternal Aunt 70    SOCIAL HISTORY:   Social History  Substance Use Topics  . Smoking status: Never Smoker  . Smokeless tobacco: Never Used  . Alcohol use No    ALLERGIES:  is allergic to amlodipine; hydromorphone; norvasc [amlodipine  besylate]; and sulfa antibiotics.  MEDICATIONS:  Current Outpatient Prescriptions  Medication Sig Dispense Refill  . aspirin 81 MG chewable tablet Chew 81 mg by mouth once.     . Cholecalciferol 1000 UNITS capsule Take 1,000 Units by mouth 2 (two) times daily.    Marland Kitchen levothyroxine (SYNTHROID, LEVOTHROID) 50 MCG tablet Take 1 tablet by mouth daily.    Marland Kitchen lisinopril (PRINIVIL,ZESTRIL) 20 MG tablet Take 20 mg by mouth daily.    Marland Kitchen MELATONIN GUMMIES PO Take 2 each by mouth at bedtime.    Marland Kitchen omeprazole (PRILOSEC) 20 MG capsule Take 1 capsule by mouth daily as needed for heartburn.    . triamcinolone (NASACORT) 55 MCG/ACT AERO nasal inhaler Place 1 spray into the nose as needed.     . venlafaxine (EFFEXOR) 75 MG tablet Take 75 mg by mouth 2 (two) times daily with a meal.     . ALPRAZolam (XANAX) 0.25 MG tablet Take 0.25 mg by mouth at bedtime as needed for anxiety. 0.5 tablet to 1 whole tablet every 12 hours as needed for anxiety    . Biotin 1 MG CAPS Take 1 capsule by mouth 1 day or 1 dose.     No current facility-administered medications for this visit.     PHYSICAL EXAMINATION: ECOG PERFORMANCE STATUS: 0 - Asymptomatic  BP 133/75 (BP Location: Left Arm, Patient Position: Sitting)   Pulse 92   Temp 97.8 F (36.6 C) (Tympanic)  Resp 18   Ht '5\' 2"'  (1.575 m)   Wt 158 lb (71.7 kg)   BMI 28.90 kg/m   Filed Weights   04/17/17 1435  Weight: 158 lb (71.7 kg)    GENERAL: Well-nourished well-developed; Alert, no distress and comfortable.   She is alone. EYES: no pallor or icterus OROPHARYNX: no thrush or ulceration; good dentition  NECK: supple, no masses felt LYMPH:  no palpable lymphadenopathy in the cervical, axillary or inguinal regions LUNGS: clear to auscultation and  No wheeze or crackles HEART/CVS: regular rate & rhythm and no murmurs; No lower extremity edema ABDOMEN:abdomen soft, non-tender and normal bowel sounds Musculoskeletal:no cyanosis of digits and no clubbing  PSYCH:  alert & oriented x 3 with fluent speech NEURO: no focal motor/sensory deficits SKIN:  no rashes or significant lesions  LABORATORY DATA:  I have reviewed the data as listed    Component Value Date/Time   NA 141 04/04/2017 1612   NA 142 05/19/2012 1957   K 4.7 04/04/2017 1612   K 4.6 05/19/2012 1957   CL 104 04/04/2017 1612   CL 108 (H) 05/19/2012 1957   CO2 18 02/02/2016 1608   CO2 26 05/19/2012 1957   GLUCOSE 92 04/04/2017 1612   GLUCOSE 94 05/19/2012 1957   BUN 24 04/04/2017 1612   BUN 10 05/19/2012 1957   CREATININE 1.00 04/04/2017 1612   CREATININE 1.09 05/19/2012 1957   CALCIUM 9.9 04/04/2017 1612   CALCIUM 9.0 05/19/2012 1957   PROT 7.1 04/04/2017 1612   PROT 6.2 (L) 05/19/2012 1957   ALBUMIN 4.5 04/04/2017 1612   ALBUMIN 3.4 05/19/2012 1957   AST 23 04/04/2017 1612   AST 31 05/19/2012 1957   ALT 16 04/04/2017 1612   ALT 28 05/19/2012 1957   ALKPHOS 69 04/04/2017 1612   ALKPHOS 61 05/19/2012 1957   BILITOT 0.3 04/04/2017 1612   BILITOT 0.3 05/19/2012 1957   GFRNONAA 61 04/04/2017 1612   GFRNONAA 56 (L) 05/19/2012 1957   GFRAA 70 04/04/2017 1612   GFRAA >60 05/19/2012 1957    No results found for: SPEP, UPEP  Lab Results  Component Value Date   WBC 2.9 (L) 04/04/2017   NEUTROABS 1.4 04/04/2017   HGB 11.6 (L) 01/30/2014   HCT 33.9 (L) 04/04/2017   MCV 93 04/04/2017   PLT 283 04/04/2017      Chemistry      Component Value Date/Time   NA 141 04/04/2017 1612   NA 142 05/19/2012 1957   K 4.7 04/04/2017 1612   K 4.6 05/19/2012 1957   CL 104 04/04/2017 1612   CL 108 (H) 05/19/2012 1957   CO2 18 02/02/2016 1608   CO2 26 05/19/2012 1957   BUN 24 04/04/2017 1612   BUN 10 05/19/2012 1957   CREATININE 1.00 04/04/2017 1612   CREATININE 1.09 05/19/2012 1957      Component Value Date/Time   CALCIUM 9.9 04/04/2017 1612   CALCIUM 9.0 05/19/2012 1957   ALKPHOS 69 04/04/2017 1612   ALKPHOS 61 05/19/2012 1957   AST 23 04/04/2017 1612   AST 31 05/19/2012  1957   ALT 16 04/04/2017 1612   ALT 28 05/19/2012 1957   BILITOT 0.3 04/04/2017 1612   BILITOT 0.3 05/19/2012 1957        ASSESSMENT & PLAN:   Hemochromatosis, hereditary ([place]) # Hereditary hemochromatosis- homozygous [as per patient] on phlebotomy. Goal= ferritin <100. Today ferritin is 108; sat 74% . No evidence of end organ dysfunction. Proceed with phlebotomy  today.   # mild leukopenia white count- ? Large lymphocytes- seen on previous bone marrow biopsy-2012. However leukopenia is intermittent currently 2.9. Asymptomatic.  Monitor for now.  # # Patient follow-up in 6 months/ for repeat phlebotomy; labs 1 week prior; Patient gets her labs through [place], MD 04/17/2017 4:33 PM

## 2017-04-17 NOTE — Assessment & Plan Note (Addendum)
#  Hereditary hemochromatosis- homozygous [as per patient] on phlebotomy. Goal= ferritin <100. Today ferritin is 108; sat 74% . No evidence of end organ dysfunction. Proceed with phlebotomy today.   # mild leukopenia white count- ? Large lymphocytes- seen on previous bone marrow biopsy-2012. However leukopenia is intermittent currently 2.9. Asymptomatic.  Monitor for now.  # # Patient follow-up in 6 months/ for repeat phlebotomy; labs 1 week prior; Patient gets her labs through [place].

## 2017-04-20 ENCOUNTER — Other Ambulatory Visit: Payer: Self-pay | Admitting: Obstetrics and Gynecology

## 2017-04-20 DIAGNOSIS — Z1231 Encounter for screening mammogram for malignant neoplasm of breast: Secondary | ICD-10-CM

## 2017-05-23 ENCOUNTER — Ambulatory Visit
Admission: RE | Admit: 2017-05-23 | Discharge: 2017-05-23 | Disposition: A | Discharge status: Home / Self Care | Payer: Managed Care, Other (non HMO) | Source: Ambulatory Visit | Attending: Obstetrics and Gynecology | Admitting: Obstetrics and Gynecology

## 2017-05-23 DIAGNOSIS — Z1231 Encounter for screening mammogram for malignant neoplasm of breast: Secondary | ICD-10-CM | POA: Diagnosis not present

## 2017-10-16 ENCOUNTER — Other Ambulatory Visit: Payer: Self-pay

## 2017-10-16 DIAGNOSIS — Z299 Encounter for prophylactic measures, unspecified: Secondary | ICD-10-CM

## 2017-10-16 NOTE — Progress Notes (Signed)
Patient came in to have blood drawn for testing per Dr. Aletha Halim orders.  In addition to Dr. Rogue Bussing, the patient wants results faxed to Dr. Lovie Macadamia at [facility].

## 2017-10-17 LAB — CMP12+LP+TP+TSH+6AC+CBC/D/PLT
ALT: 19 IU/L (ref 0–32)
AST: 22 IU/L (ref 0–40)
Albumin/Globulin Ratio: 1.8 (ref 1.2–2.2)
Albumin: 4.6 g/dL (ref 3.6–4.8)
Alkaline Phosphatase: 75 IU/L (ref 39–117)
BUN/Creatinine Ratio: 19 (ref 12–28)
BUN: 24 mg/dL (ref 8–27)
Basophils Absolute: 0 10*3/uL (ref 0.0–0.2)
Basos: 0 %
Bilirubin Total: 0.4 mg/dL (ref 0.0–1.2)
Calcium: 9.7 mg/dL (ref 8.7–10.3)
Chloride: 104 mmol/L (ref 96–106)
Chol/HDL Ratio: 2.7 ratio (ref 0.0–4.4)
Cholesterol, Total: 254 mg/dL — ABNORMAL HIGH (ref 100–199)
Creatinine, Ser: 1.27 mg/dL — ABNORMAL HIGH (ref 0.57–1.00)
EOS (ABSOLUTE): 0.2 10*3/uL (ref 0.0–0.4)
Eos: 6 %
Estimated CHD Risk: <0.5 times avg. (ref 0.0–1.0)
Free Thyroxine Index: 1.6 (ref 1.2–4.9)
GFR calc Af Amer: 52 mL/min/{1.73_m2} — ABNORMAL LOW (ref >59)
GFR calc non Af Amer: 45 mL/min/{1.73_m2} — ABNORMAL LOW (ref >59)
GGT: 18 IU/L (ref 0–60)
Globulin, Total: 2.6 g/dL (ref 1.5–4.5)
Glucose: 87 mg/dL (ref 65–99)
HDL: 93 mg/dL (ref >39)
Hematocrit: 33.1 % — ABNORMAL LOW (ref 34.0–46.6)
Hemoglobin: 11.2 g/dL (ref 11.1–15.9)
Immature Grans (Abs): 0 10*3/uL (ref 0.0–0.1)
Immature Granulocytes: 0 %
Iron: 181 ug/dL — ABNORMAL HIGH (ref 27–139)
LDH: 226 IU/L (ref 119–226)
LDL Calculated: 141 mg/dL — ABNORMAL HIGH (ref 0–99)
Lymphocytes Absolute: 0.9 10*3/uL (ref 0.7–3.1)
Lymphs: 33 %
MCH: 32.1 pg (ref 26.6–33.0)
MCHC: 33.8 g/dL (ref 31.5–35.7)
MCV: 95 fL (ref 79–97)
Monocytes Absolute: 0.3 10*3/uL (ref 0.1–0.9)
Monocytes: 9 %
Neutrophils Absolute: 1.4 10*3/uL (ref 1.4–7.0)
Neutrophils: 52 %
Phosphorus: 3.3 mg/dL (ref 2.5–4.5)
Platelets: 283 10*3/uL (ref 150–379)
Potassium: 4.2 mmol/L (ref 3.5–5.2)
RBC: 3.49 x10E6/uL — ABNORMAL LOW (ref 3.77–5.28)
RDW: 13.5 % (ref 12.3–15.4)
Sodium: 140 mmol/L (ref 134–144)
T3 Uptake Ratio: 25 % (ref 24–39)
T4, Total: 6.4 ug/dL (ref 4.5–12.0)
TSH: 1.23 u[IU]/mL (ref 0.450–4.500)
Total Protein: 7.2 g/dL (ref 6.0–8.5)
Triglycerides: 102 mg/dL (ref 0–149)
Uric Acid: 6.1 mg/dL (ref 2.5–7.1)
VLDL Cholesterol Cal: 20 mg/dL (ref 5–40)
WBC: 2.8 10*3/uL — ABNORMAL LOW (ref 3.4–10.8)

## 2017-10-17 LAB — IRON AND TIBC
Iron Saturation: 64 % — ABNORMAL HIGH (ref 15–55)
Total Iron Binding Capacity: 281 ug/dL (ref 250–450)
UIBC: 100 ug/dL — ABNORMAL LOW (ref 118–369)

## 2017-10-17 LAB — FERRITIN: Ferritin: 209 ng/mL — ABNORMAL HIGH (ref 15–150)

## 2017-10-18 ENCOUNTER — Other Ambulatory Visit: Payer: Self-pay

## 2017-10-18 ENCOUNTER — Inpatient Hospital Stay: Payer: Managed Care, Other (non HMO)

## 2017-10-18 ENCOUNTER — Inpatient Hospital Stay: Payer: Managed Care, Other (non HMO) | Attending: Internal Medicine | Admitting: Internal Medicine

## 2017-10-18 ENCOUNTER — Encounter: Payer: Self-pay | Admitting: Internal Medicine

## 2017-10-18 DIAGNOSIS — Z7982 Long term (current) use of aspirin: Secondary | ICD-10-CM | POA: Diagnosis not present

## 2017-10-18 DIAGNOSIS — E039 Hypothyroidism, unspecified: Secondary | ICD-10-CM | POA: Insufficient documentation

## 2017-10-18 DIAGNOSIS — D72819 Decreased white blood cell count, unspecified: Secondary | ICD-10-CM | POA: Diagnosis not present

## 2017-10-18 DIAGNOSIS — R1310 Dysphagia, unspecified: Secondary | ICD-10-CM | POA: Insufficient documentation

## 2017-10-18 DIAGNOSIS — Z79899 Other long term (current) drug therapy: Secondary | ICD-10-CM | POA: Insufficient documentation

## 2017-10-18 DIAGNOSIS — I10 Essential (primary) hypertension: Secondary | ICD-10-CM | POA: Insufficient documentation

## 2017-10-18 NOTE — Assessment & Plan Note (Addendum)
#  Hereditary hemochromatosis- homozygous [as per patient] on phlebotomy. Goal= ferritin <100. Today ferritin is 108; sat 64% . No evidence of end organ dysfunction. Proceed with phlebotomy today.   # mild leukopenia white count-2.8 ? Large lymphocytes- seen on previous bone marrow biopsy-2012.Asymptomatic.  Monitor for now.   # Dysphagia-? Pills/water-; heart burn- PPI; if not improved to inform us or PCP regarding workup including EGD/barium swallow.  # # Patient follow-up in 6 months/ for repeat phlebotomy; labs 1 week prior; Patient gets her labs through [place].

## 2017-10-18 NOTE — Progress Notes (Signed)
[place] OFFICE PROGRESS NOTE  Patient Care Team: [name], MD as PCP - General (Family Medicine) Inez Pilgrim Simonne Come, MD (Internal Medicine) Christene Lye, MD (General Surgery)   SUMMARY OF HEMATOLOGIC-ONCOLOGIC HISTORY:  # 2002- HEREDITARY HEMOCHROMATOSIS HOMOZYGOUS [goal ferritin 100-150]    # 2012- BMbx [sec to Anemia]-Normo-cellular; T Large Granular Lymphocytes by flowcytometry [4.5%] primary vs Reactive; FISH MDS- NEG  # Hx of Stroke [2012; no deficits]     INTERVAL HISTORY:  A very pleasant 61 year old female patient with above history of homozygous hereditary hemochromatosis is here for follow-up.   Patient has intermittent episodes of difficulty swallowing associated with heartburn over the last few weeks.  Denies any blood in stools.  Denies any black colored stools.  No weight loss.  She is not on PPI.  Denies abdominal pain. Denies any swelling in the legs. Denies any skin changes. No weight loss or weight gain. No fever no chills.  REVIEW OF SYSTEMS: No nausea no vomiting.  A complete 10 point review of system is done which is negative except mentioned above/history of present illness.    PAST MEDICAL HISTORY :  Past Medical History:  Diagnosis Date  . Abnormal CT scan, sinus    VASCULAR ABNORMALITY  . Anemia   . Breast cyst 8+ years    HAD SURGICAL F/U WITH DR Pat Patrick   . Elevated ferritin level   . Hemochromatosis    Homozygous on genetic testing March 2002  . Hypertension     PAST SURGICAL HISTORY :   Past Surgical History:  Procedure Laterality Date  . BONE MARROW BIOPSY  01/26/2011   FLOW SHOWS POSITIVE FOR LGL'S, FISH IS NEG,  . BREAST EXCISIONAL BIOPSY Right 2011   Negative  . COLONOSCOPY  07/2011  . ESOPHAGOGASTRODUODENOSCOPY  07/2011    FAMILY HISTORY :   Family History  Problem Relation Age of Onset  . Ovarian cancer Mother        BRCA status reported negative  . Breast cancer Maternal Aunt 70    SOCIAL  HISTORY:   Social History   Tobacco Use  . Smoking status: Never Smoker  . Smokeless tobacco: Never Used  Substance Use Topics  . Alcohol use: No  . Drug use: No    ALLERGIES:  is allergic to amlodipine; hydromorphone; norvasc [amlodipine besylate]; and sulfa antibiotics.  MEDICATIONS:  Current Outpatient Medications  Medication Sig Dispense Refill  . ALPRAZolam (XANAX) 0.25 MG tablet Take 0.25 mg by mouth at bedtime as needed for anxiety. 0.5 tablet to 1 whole tablet every 12 hours as needed for anxiety    . aspirin 81 MG chewable tablet Chew 81 mg by mouth once.     . Biotin 1 MG CAPS Take 1 capsule by mouth 1 day or 1 dose.    . Cholecalciferol 1000 UNITS capsule Take 1,000 Units by mouth 2 (two) times daily.    Marland Kitchen levothyroxine (SYNTHROID, LEVOTHROID) 50 MCG tablet Take 1 tablet by mouth daily.    Marland Kitchen lisinopril (PRINIVIL,ZESTRIL) 20 MG tablet Take 20 mg by mouth daily.    Marland Kitchen MELATONIN GUMMIES PO Take 2 each by mouth at bedtime.    . triamcinolone (NASACORT) 55 MCG/ACT AERO nasal inhaler Place 1 spray into the nose as needed.     . venlafaxine (EFFEXOR) 75 MG tablet Take 75 mg by mouth 2 (two) times daily with a meal.     . omeprazole (PRILOSEC) 20 MG capsule Take 1 capsule by mouth  daily as needed for heartburn.     No current facility-administered medications for this visit.     PHYSICAL EXAMINATION: ECOG PERFORMANCE STATUS: 0 - Asymptomatic  BP 125/84   Pulse 78   Temp 97.7 F (36.5 C) (Tympanic)   Resp 20   Ht '5\' 2"'  (1.575 m)   Wt 157 lb (71.2 kg)   BMI 28.72 kg/m   Filed Weights   10/18/17 1427  Weight: 157 lb (71.2 kg)    GENERAL: Well-nourished well-developed; Alert, no distress and comfortable.   She is alone. EYES: no pallor or icterus OROPHARYNX: no thrush or ulceration; good dentition  NECK: supple, no masses felt LYMPH:  no palpable lymphadenopathy in the cervical, axillary or inguinal regions LUNGS: clear to auscultation and  No wheeze or  crackles HEART/CVS: regular rate & rhythm and no murmurs; No lower extremity edema ABDOMEN:abdomen soft, non-tender and normal bowel sounds Musculoskeletal:no cyanosis of digits and no clubbing  PSYCH: alert & oriented x 3 with fluent speech NEURO: no focal motor/sensory deficits SKIN:  no rashes or significant lesions  LABORATORY DATA:  I have reviewed the data as listed    Component Value Date/Time   NA 140 10/16/2017 1119   NA 142 05/19/2012 1957   K 4.2 10/16/2017 1119   K 4.6 05/19/2012 1957   CL 104 10/16/2017 1119   CL 108 (H) 05/19/2012 1957   CO2 18 02/02/2016 1608   CO2 26 05/19/2012 1957   GLUCOSE 87 10/16/2017 1119   GLUCOSE 94 05/19/2012 1957   BUN 24 10/16/2017 1119   BUN 10 05/19/2012 1957   CREATININE 1.27 (H) 10/16/2017 1119   CREATININE 1.09 05/19/2012 1957   CALCIUM 9.7 10/16/2017 1119   CALCIUM 9.0 05/19/2012 1957   PROT 7.2 10/16/2017 1119   PROT 6.2 (L) 05/19/2012 1957   ALBUMIN 4.6 10/16/2017 1119   ALBUMIN 3.4 05/19/2012 1957   AST 22 10/16/2017 1119   AST 31 05/19/2012 1957   ALT 19 10/16/2017 1119   ALT 28 05/19/2012 1957   ALKPHOS 75 10/16/2017 1119   ALKPHOS 61 05/19/2012 1957   BILITOT 0.4 10/16/2017 1119   BILITOT 0.3 05/19/2012 1957   GFRNONAA 45 (L) 10/16/2017 1119   GFRNONAA 56 (L) 05/19/2012 1957   GFRAA 52 (L) 10/16/2017 1119   GFRAA >60 05/19/2012 1957    No results found for: SPEP, UPEP  Lab Results  Component Value Date   WBC 2.8 (L) 10/16/2017   NEUTROABS 1.4 10/16/2017   HGB 11.2 10/16/2017   HCT 33.1 (L) 10/16/2017   MCV 95 10/16/2017   PLT 283 10/16/2017      Chemistry      Component Value Date/Time   NA 140 10/16/2017 1119   NA 142 05/19/2012 1957   K 4.2 10/16/2017 1119   K 4.6 05/19/2012 1957   CL 104 10/16/2017 1119   CL 108 (H) 05/19/2012 1957   CO2 18 02/02/2016 1608   CO2 26 05/19/2012 1957   BUN 24 10/16/2017 1119   BUN 10 05/19/2012 1957   CREATININE 1.27 (H) 10/16/2017 1119   CREATININE 1.09  05/19/2012 1957      Component Value Date/Time   CALCIUM 9.7 10/16/2017 1119   CALCIUM 9.0 05/19/2012 1957   ALKPHOS 75 10/16/2017 1119   ALKPHOS 61 05/19/2012 1957   AST 22 10/16/2017 1119   AST 31 05/19/2012 1957   ALT 19 10/16/2017 1119   ALT 28 05/19/2012 1957   BILITOT 0.4 10/16/2017 1119  BILITOT 0.3 05/19/2012 1957        ASSESSMENT & PLAN:   Hemochromatosis, hereditary ([place]) # Hereditary hemochromatosis- homozygous [as per patient] on phlebotomy. Goal= ferritin <100. Today ferritin is 108; sat 64% . No evidence of end organ dysfunction. Proceed with phlebotomy today.   # mild leukopenia white count-2.8 ? Large lymphocytes- seen on previous bone marrow biopsy-2012.Asymptomatic.  Monitor for now.   # Dysphagia-? Pills/water-; heart burn- PPI; if not improved to inform us or PCP regarding workup including EGD/barium swallow.  # # Patient follow-up in 6 months/ for repeat phlebotomy; labs 1 week prior; Patient gets her labs through [place], MD 10/24/2017 7:58 PM

## 2017-12-10 ENCOUNTER — Encounter: Payer: Self-pay | Admitting: Emergency Medicine

## 2017-12-10 ENCOUNTER — Other Ambulatory Visit: Payer: Self-pay

## 2017-12-10 ENCOUNTER — Emergency Department
Admission: EM | Admit: 2017-12-10 | Discharge: 2017-12-10 | Disposition: A | Discharge status: Home / Self Care | Payer: Managed Care, Other (non HMO) | Attending: Emergency Medicine | Admitting: Emergency Medicine

## 2017-12-10 DIAGNOSIS — G8929 Other chronic pain: Secondary | ICD-10-CM | POA: Diagnosis not present

## 2017-12-10 DIAGNOSIS — I10 Essential (primary) hypertension: Secondary | ICD-10-CM | POA: Insufficient documentation

## 2017-12-10 DIAGNOSIS — Z79899 Other long term (current) drug therapy: Secondary | ICD-10-CM | POA: Insufficient documentation

## 2017-12-10 DIAGNOSIS — Z7982 Long term (current) use of aspirin: Secondary | ICD-10-CM | POA: Diagnosis not present

## 2017-12-10 DIAGNOSIS — M545 Low back pain: Secondary | ICD-10-CM | POA: Insufficient documentation

## 2017-12-10 DIAGNOSIS — M62830 Muscle spasm of back: Secondary | ICD-10-CM | POA: Diagnosis not present

## 2017-12-10 HISTORY — DX: Dorsalgia, unspecified: M54.9

## 2017-12-10 MED ORDER — OXYCODONE-ACETAMINOPHEN 5-325 MG PO TABS
1.0000 | ORAL_TABLET | Freq: Once | ORAL | Status: AC
Start: 2017-12-10 — End: 2017-12-10
  Administered 2017-12-10: 1 via ORAL
  Filled 2017-12-10: qty 1

## 2017-12-10 MED ORDER — KETOROLAC TROMETHAMINE 10 MG PO TABS: 10.0000 mg | ORAL_TABLET | Freq: Three times a day (TID) | ORAL | 0 refills | Status: DC

## 2017-12-10 MED ORDER — KETOROLAC TROMETHAMINE 30 MG/ML IJ SOLN
30.0000 mg | Freq: Once | INTRAMUSCULAR | Status: AC
  Administered 2017-12-10: 30 mg via INTRAMUSCULAR
  Filled 2017-12-10: qty 1

## 2017-12-10 MED ORDER — DIAZEPAM 2 MG PO TABS: 2.0000 mg | ORAL_TABLET | Freq: Three times a day (TID) | ORAL | 0 refills | Status: DC | PRN

## 2017-12-10 MED ORDER — DIAZEPAM 5 MG PO TABS
ORAL_TABLET | ORAL | Status: AC
  Filled 2017-12-10: qty 1

## 2017-12-10 MED ORDER — ORPHENADRINE CITRATE 30 MG/ML IJ SOLN
60.0000 mg | INTRAMUSCULAR | Status: AC
  Administered 2017-12-10: 60 mg via INTRAMUSCULAR
  Filled 2017-12-10: qty 2

## 2017-12-10 MED ORDER — LORAZEPAM 1 MG PO TABS: 1.0000 mg | ORAL_TABLET | Freq: Once | ORAL | Status: DC

## 2017-12-10 MED ORDER — DIAZEPAM 5 MG PO TABS
5.0000 mg | ORAL_TABLET | Freq: Once | ORAL | Status: AC
  Administered 2017-12-10: 5 mg via ORAL

## 2017-12-10 NOTE — ED Provider Notes (Signed)
[facility] Emergency Department Provider Note ____________________________________________  Time seen: 2228  I have reviewed the triage vital signs and the nursing notes.  HISTORY  Chief Complaint  Back Pain  HPI Katie Wise is a 61 y.o. female presents to the ED via EMS from home, for complaints of back pain and spasm.  Patient gives a history of chronic back pain with a known pinched nerve in the lower back.  She denies any intermittent ongoing back pain.  She describes onset of pain on Wednesday, after she tossed a large suitcase onto the bed. She denies any distal paresthesias, foot drop, or bladder incontinence. She localizes pain to the lower back referral sciatic pain.  She complains of muscle spasms during the interview.  Patient is actively writhing twisting and turning, and arching her back during the interview.  Reports taking Tylenol and Motrin in the interim without significant benefit.  She claims she has no home medications for muscle relaxants, anti-inflammatories, or steroids to dose as needed.  She reports being a former patient of Dr. Rhina Brackett, in [place], but has not seen him in several years.  She gives a history of epidural steroid injections in her low back but denies any surgeries.  Past Medical History:  Diagnosis Date  . Abnormal CT scan, sinus    VASCULAR ABNORMALITY  . Anemia   . Back pain   . Breast cyst 8+ years    HAD SURGICAL F/U WITH DR Pat Patrick   . Elevated ferritin level   . Hemochromatosis    Homozygous on genetic testing March 2002  . Hypertension     Patient Active Problem List   Diagnosis Date Noted  . Cerebrovascular accident, 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  . Abnormal brain scan 08/07/2014  . Cephalalgia 08/07/2014  . Cerebral vascular accident ([place]) 08/07/2014  . Cerebrovascular accident (CVA) ([place]) 08/07/2014  . Anxiety state 05/01/2014  . Hemochromatosis, hereditary ([place]) 05/01/2014  . BP (high blood pressure)  05/01/2014  . Ache in joint 05/01/2014  . Anxiety 05/01/2014    Past Surgical History:  Procedure Laterality Date  . BONE MARROW BIOPSY  01/26/2011   FLOW SHOWS POSITIVE FOR LGL'S, FISH IS NEG,  . BREAST EXCISIONAL BIOPSY Right 2011   Negative  . CHOLECYSTECTOMY    . COLONOSCOPY  07/2011  . ESOPHAGOGASTRODUODENOSCOPY  07/2011    Prior to Admission medications   Medication Sig Start Date End Date Taking? Authorizing Provider  ALPRAZolam Duanne Moron) 0.25 MG tablet Take 0.25 mg by mouth at bedtime as needed for anxiety. 0.5 tablet to 1 whole tablet every 12 hours as needed for anxiety    [provider]  aspirin 81 MG chewable tablet Chew 81 mg by mouth once.     [provider]  Biotin 1 MG CAPS Take 1 capsule by mouth 1 day or 1 dose.    [provider]  Cholecalciferol 1000 UNITS capsule Take 1,000 Units by mouth 2 (two) times daily.    [provider]  diazepam (VALIUM) 2 MG tablet Take 1 tablet (2 mg total) by mouth every 8 (eight) hours as needed for muscle spasms. 12/10/17   Fatin Bachicha, Dannielle Karvonen, PA-C  ketorolac (TORADOL) 10 MG tablet Take 1 tablet (10 mg total) by mouth every 8 (eight) hours. 12/10/17   Samie Reasons, Dannielle Karvonen, PA-C  levothyroxine (SYNTHROID, LEVOTHROID) 50 MCG tablet Take 1 tablet by mouth daily. 03/13/17   [provider]  lisinopril (PRINIVIL,ZESTRIL) 20 MG tablet Take 20 mg  by mouth daily.    [provider]  MELATONIN GUMMIES PO Take 2 each by mouth at bedtime.    [provider]  omeprazole (PRILOSEC) 20 MG capsule Take 1 capsule by mouth daily as needed for heartburn. 03/30/17 03/30/18  [provider]  triamcinolone (NASACORT) 55 MCG/ACT AERO nasal inhaler Place 1 spray into the nose as needed.     [provider]  venlafaxine (EFFEXOR) 75 MG tablet Take 75 mg by mouth 2 (two) times daily with a meal.     [provider]    Allergies Amlodipine; Hydromorphone; Norvasc  [amlodipine besylate]; and Sulfa antibiotics  Family History  Problem Relation Age of Onset  . Ovarian cancer Mother        BRCA status reported negative  . Breast cancer Maternal Aunt 70    Social History Social History   Tobacco Use  . Smoking status: Never Smoker  . Smokeless tobacco: Never Used  Substance Use Topics  . Alcohol use: Yes  . Drug use: No    Review of Systems  Constitutional: Negative for fever. Cardiovascular: Negative for chest pain. Respiratory: Negative for shortness of breath. Gastrointestinal: Negative for abdominal pain, vomiting and diarrhea. Genitourinary: Negative for dysuria. Musculoskeletal: Positive for back pain. Skin: Negative for rash. Neurological: Negative for headaches, focal weakness or numbness. ____________________________________________  PHYSICAL EXAM:  VITAL SIGNS: ED Triage Vitals  Enc Vitals Group     BP 12/10/17 2104 (!) 147/92     Pulse Rate 12/10/17 2104 71     Resp 12/10/17 2104 20     Temp 12/10/17 2104 98.3 F (36.8 C)     Temp Source 12/10/17 2104 Oral     SpO2 12/10/17 2104 98 %     Weight 12/10/17 2104 151 lb (68.5 kg)     Height 12/10/17 2104 '5\' 2"'  (1.575 m)     Head Circumference --      Peak Flow --      Pain Score 12/10/17 2103 10     Pain Loc --      Pain Edu? --      Excl. in [place]? --     Constitutional: Alert and oriented. Well appearing and in no distress. Head: Normocephalic and atraumatic. Cardiovascular: Normal rate, regular rhythm. Normal distal pulses. Respiratory: Normal respiratory effort. No wheezes/rales/rhonchi. Gastrointestinal: Soft and nontender. No distention. Musculoskeletal: Spinal alignment without midline tenderness, spasm, deformity, or step-off.  Patient able to demonstrate hip flexion and extension on exam.  Nontender with normal range of motion in all extremities.  Neurologic: Cranial nerves II through XII grossly intact.  Normal LE DTRs bilaterally.  Normal toe dorsiflexion on  exam.  Normal speech and language. No gross focal neurologic deficits are appreciated. Skin:  Skin is warm, dry and intact. No rash noted. Psychiatric: Mood anxious and affect is hyperactive normal. Patient exhibits appropriate insight and judgment. ____________________________________________  PROCEDURES  Procedures Toradol 30 mg IM Norflex 60 mg IM Valium 5 mg PO Percocet 5-325 mg PO ____________________________________________  INITIAL IMPRESSION / ASSESSMENT AND PLAN / ED COURSE  Patient with ED evaluation of acute back pain and spasms with onset 4 days prior to arrival.  Patient's exam is overall benign.  On exam she shows normal reflexes and range of motion.  She is actively rolling, writhing, and arching her back in the bed, which is a presentation not consistent with severe muscle spasms.  She has however discharged after medication administration with prescriptions for ketorolac and Valium  to dose as directed.  She is referred to Dr. Rip Harbour or her primary care provider for ongoing management. ____________________________________________  FINAL CLINICAL IMPRESSION(S) / ED DIAGNOSES  Final diagnoses:  Chronic midline low back pain without sciatica  Muscle spasm of back      Melvenia Needles, PA-C 12/10/17 2322    Schuyler Amor, MD 12/10/17 2337

## 2017-12-10 NOTE — ED Notes (Signed)
Pt denies pain radiating down legs, denies incontinence.

## 2017-12-10 NOTE — ED Triage Notes (Signed)
Pt arrived to the ED via EMS from home for complaints of back pain. Pt reports that she has chronic back pain problems with a know pinched nerve. Pt states that the pain started to flare up after lifting a suit case. Pt is AOx4 in moderate pain.

## 2017-12-10 NOTE — Discharge Instructions (Signed)
Take the prescription meds as directed. Follow-up with Dr. Lovie Macadamia or Dr. Rip Harbour for continued symptoms.

## 2018-04-13 ENCOUNTER — Other Ambulatory Visit: Payer: Self-pay

## 2018-04-14 LAB — COMPREHENSIVE METABOLIC PANEL
ALT: 21 IU/L (ref 0–32)
AST: 21 IU/L (ref 0–40)
Albumin/Globulin Ratio: 2 (ref 1.2–2.2)
Albumin: 4.9 g/dL — ABNORMAL HIGH (ref 3.6–4.8)
Alkaline Phosphatase: 76 IU/L (ref 39–117)
BUN/Creatinine Ratio: 24 (ref 12–28)
BUN: 34 mg/dL — ABNORMAL HIGH (ref 8–27)
Bilirubin Total: 0.3 mg/dL (ref 0.0–1.2)
CO2: 21 mmol/L (ref 20–29)
Calcium: 9.8 mg/dL (ref 8.7–10.3)
Chloride: 105 mmol/L (ref 96–106)
Creatinine, Ser: 1.39 mg/dL — ABNORMAL HIGH (ref 0.57–1.00)
GFR calc Af Amer: 47 mL/min/{1.73_m2} — ABNORMAL LOW (ref >59)
GFR calc non Af Amer: 40 mL/min/{1.73_m2} — ABNORMAL LOW (ref >59)
Globulin, Total: 2.4 g/dL (ref 1.5–4.5)
Glucose: 98 mg/dL (ref 65–99)
Potassium: 5.4 mmol/L — ABNORMAL HIGH (ref 3.5–5.2)
Sodium: 141 mmol/L (ref 134–144)
Total Protein: 7.3 g/dL (ref 6.0–8.5)

## 2018-04-14 LAB — CBC WITH DIFFERENTIAL/PLATELET
Basophils Absolute: 0 10*3/uL (ref 0.0–0.2)
Basos: 0 %
EOS (ABSOLUTE): 0.1 10*3/uL (ref 0.0–0.4)
Eos: 3 %
Hematocrit: 32.2 % — ABNORMAL LOW (ref 34.0–46.6)
Hemoglobin: 11.1 g/dL (ref 11.1–15.9)
Immature Grans (Abs): 0 10*3/uL (ref 0.0–0.1)
Immature Granulocytes: 0 %
Lymphocytes Absolute: 1.1 10*3/uL (ref 0.7–3.1)
Lymphs: 34 %
MCH: 31.2 pg (ref 26.6–33.0)
MCHC: 34.5 g/dL (ref 31.5–35.7)
MCV: 90 fL (ref 79–97)
Monocytes Absolute: 0.4 10*3/uL (ref 0.1–0.9)
Monocytes: 13 %
Neutrophils Absolute: 1.6 10*3/uL (ref 1.4–7.0)
Neutrophils: 50 %
Platelets: 276 10*3/uL (ref 150–379)
RBC: 3.56 x10E6/uL — ABNORMAL LOW (ref 3.77–5.28)
RDW: 13.3 % (ref 12.3–15.4)
WBC: 3.3 10*3/uL — ABNORMAL LOW (ref 3.4–10.8)

## 2018-04-14 LAB — IRON,TIBC AND FERRITIN PANEL
Ferritin: 225 ng/mL — ABNORMAL HIGH (ref 15–150)
Iron Saturation: 68 % — ABNORMAL HIGH (ref 15–55)
Iron: 195 ug/dL — ABNORMAL HIGH (ref 27–139)
Total Iron Binding Capacity: 286 ug/dL (ref 250–450)
UIBC: 91 ug/dL — ABNORMAL LOW (ref 118–369)

## 2018-04-18 ENCOUNTER — Other Ambulatory Visit: Payer: Self-pay

## 2018-04-18 ENCOUNTER — Encounter: Payer: Self-pay | Admitting: Internal Medicine

## 2018-04-18 ENCOUNTER — Inpatient Hospital Stay: Payer: Managed Care, Other (non HMO)

## 2018-04-18 ENCOUNTER — Inpatient Hospital Stay: Payer: Managed Care, Other (non HMO) | Attending: Internal Medicine | Admitting: Internal Medicine

## 2018-04-18 DIAGNOSIS — D72819 Decreased white blood cell count, unspecified: Secondary | ICD-10-CM

## 2018-04-18 NOTE — Progress Notes (Signed)
[place] OFFICE PROGRESS NOTE  Patient Care Team: [name], MD as PCP - General (Family Medicine) Inez Pilgrim Simonne Come, MD (Internal Medicine) Christene Lye, MD (General Surgery)   SUMMARY OF HEMATOLOGIC-ONCOLOGIC HISTORY:  # 2002- HEREDITARY HEMOCHROMATOSIS HOMOZYGOUS [goal ferritin 100-150]    # 2012- BMbx [sec to Anemia]-Normo-cellular; T Large Granular Lymphocytes by flowcytometry [4.5%] primary vs Reactive; FISH MDS- NEG  # Hx of Stroke [2012; no deficits]     INTERVAL HISTORY:  62 year old female patient with a prior history of homozygous hereditary hemochromatosis is here for follow-up.  Patient denies any swelling in the legs but denies any skin changes.  No weight loss or weight gain.  No fevers or chills.   Review of Systems  Constitutional: Negative for chills, diaphoresis, fever, malaise/fatigue and weight loss.  HENT: Negative for nosebleeds and sore throat.   Eyes: Negative for double vision.  Respiratory: Negative for cough, hemoptysis, sputum production, shortness of breath and wheezing.   Cardiovascular: Negative for chest pain, palpitations, orthopnea and leg swelling.  Gastrointestinal: Negative for abdominal pain, blood in stool, constipation, diarrhea, heartburn, melena, nausea and vomiting.  Genitourinary: Negative for dysuria, frequency and urgency.  Musculoskeletal: Negative for back pain and joint pain.  Skin: Negative.  Negative for itching and rash.  Neurological: Negative for dizziness, tingling, focal weakness, weakness and headaches.  Endo/Heme/Allergies: Does not bruise/bleed easily.  Psychiatric/Behavioral: Negative for depression. The patient is not nervous/anxious and does not have insomnia.       PAST MEDICAL HISTORY :  Past Medical History:  Diagnosis Date  . Abnormal CT scan, sinus    VASCULAR ABNORMALITY  . Anemia   . Back pain   . Breast cyst 8+ years    HAD SURGICAL F/U WITH DR Pat Patrick   . Elevated  ferritin level   . Hemochromatosis    Homozygous on genetic testing March 2002  . Hypertension     PAST SURGICAL HISTORY :   Past Surgical History:  Procedure Laterality Date  . BONE MARROW BIOPSY  01/26/2011   FLOW SHOWS POSITIVE FOR LGL'S, FISH IS NEG,  . BREAST EXCISIONAL BIOPSY Right 2011   Negative  . CHOLECYSTECTOMY    . COLONOSCOPY  07/2011  . ESOPHAGOGASTRODUODENOSCOPY  07/2011    FAMILY HISTORY :   Family History  Problem Relation Age of Onset  . Ovarian cancer Mother        BRCA status reported negative  . Breast cancer Maternal Aunt 70    SOCIAL HISTORY:   Social History   Tobacco Use  . Smoking status: Never Smoker  . Smokeless tobacco: Never Used  Substance Use Topics  . Alcohol use: Yes  . Drug use: No    ALLERGIES:  is allergic to amlodipine; hydromorphone; norvasc [amlodipine besylate]; and sulfa antibiotics.  MEDICATIONS:  Current Outpatient Medications  Medication Sig Dispense Refill  . ALPRAZolam (XANAX) 0.25 MG tablet Take 0.25 mg by mouth at bedtime as needed for anxiety. 0.5 tablet to 1 whole tablet every 12 hours as needed for anxiety    . aspirin 81 MG chewable tablet Chew 81 mg by mouth once.     . Biotin 1 MG CAPS Take 1 capsule by mouth 1 day or 1 dose.    . Cholecalciferol 1000 UNITS capsule Take 1,000 Units by mouth 2 (two) times daily.    Marland Kitchen levothyroxine (SYNTHROID, LEVOTHROID) 50 MCG tablet Take 1 tablet by mouth daily.    Marland Kitchen lisinopril (PRINIVIL,ZESTRIL) 20 MG tablet  Take 20 mg by mouth daily.    Marland Kitchen triamcinolone (NASACORT) 55 MCG/ACT AERO nasal inhaler Place 1 spray into the nose as needed.     . venlafaxine (EFFEXOR) 75 MG tablet Take 75 mg by mouth 2 (two) times daily with a meal.     . diazepam (VALIUM) 2 MG tablet Take 1 tablet (2 mg total) by mouth every 8 (eight) hours as needed for muscle spasms. (Patient not taking: Reported on 04/18/2018) 10 tablet 0  . ketorolac (TORADOL) 10 MG tablet Take 1 tablet (10 mg total) by mouth every 8  (eight) hours. (Patient not taking: Reported on 04/18/2018) 15 tablet 0  . MELATONIN GUMMIES PO Take 2 each by mouth at bedtime.    Marland Kitchen omeprazole (PRILOSEC) 20 MG capsule Take 1 capsule by mouth daily as needed for heartburn.     No current facility-administered medications for this visit.     PHYSICAL EXAMINATION: ECOG PERFORMANCE STATUS: 0 - Asymptomatic  BP 116/83   Pulse 79   Temp (!) 97 F (36.1 C) (Tympanic)   Resp 20   There were no vitals filed for this visit.  GENERAL: Well-nourished well-developed; Alert, no distress and comfortable.  A alone. EYES: no pallor or icterus OROPHARYNX: no thrush or ulceration; NECK: supple; no lymph nodes felt. LYMPH:  no palpable lymphadenopathy in the axillary or inguinal regions LUNGS: Decreased breath sounds auscultation bilaterally. No wheeze or crackles HEART/CVS: regular rate & rhythm and no murmurs; No lower extremity edema ABDOMEN:abdomen soft, non-tender and normal bowel sounds. No hepatomegaly or splenomegaly.  Musculoskeletal:no cyanosis of digits and no clubbing  PSYCH: alert & oriented x 3 with fluent speech NEURO: no focal motor/sensory deficits SKIN:  no rashes or significant lesions  LABORATORY DATA:  I have reviewed the data as listed    Component Value Date/Time   NA 141 04/13/2018 1517   NA 142 05/19/2012 1957   K 5.4 (H) 04/13/2018 1517   K 4.6 05/19/2012 1957   CL 105 04/13/2018 1517   CL 108 (H) 05/19/2012 1957   CO2 21 04/13/2018 1517   CO2 26 05/19/2012 1957   GLUCOSE 98 04/13/2018 1517   GLUCOSE 94 05/19/2012 1957   BUN 34 (H) 04/13/2018 1517   BUN 10 05/19/2012 1957   CREATININE 1.39 (H) 04/13/2018 1517   CREATININE 1.09 05/19/2012 1957   CALCIUM 9.8 04/13/2018 1517   CALCIUM 9.0 05/19/2012 1957   PROT 7.3 04/13/2018 1517   PROT 6.2 (L) 05/19/2012 1957   ALBUMIN 4.9 (H) 04/13/2018 1517   ALBUMIN 3.4 05/19/2012 1957   AST 21 04/13/2018 1517   AST 31 05/19/2012 1957   ALT 21 04/13/2018 1517   ALT  28 05/19/2012 1957   ALKPHOS 76 04/13/2018 1517   ALKPHOS 61 05/19/2012 1957   BILITOT 0.3 04/13/2018 1517   BILITOT 0.3 05/19/2012 1957   GFRNONAA 40 (L) 04/13/2018 1517   GFRNONAA 56 (L) 05/19/2012 1957   GFRAA 47 (L) 04/13/2018 1517   GFRAA >60 05/19/2012 1957    No results found for: SPEP, UPEP  Lab Results  Component Value Date   WBC 3.3 (L) 04/13/2018   NEUTROABS 1.6 04/13/2018   HGB 11.1 04/13/2018   HCT 32.2 (L) 04/13/2018   MCV 90 04/13/2018   PLT 276 04/13/2018      Chemistry      Component Value Date/Time   NA 141 04/13/2018 1517   NA 142 05/19/2012 1957   K 5.4 (H) 04/13/2018 1517  K 4.6 05/19/2012 1957   CL 105 04/13/2018 1517   CL 108 (H) 05/19/2012 1957   CO2 21 04/13/2018 1517   CO2 26 05/19/2012 1957   BUN 34 (H) 04/13/2018 1517   BUN 10 05/19/2012 1957   CREATININE 1.39 (H) 04/13/2018 1517   CREATININE 1.09 05/19/2012 1957      Component Value Date/Time   CALCIUM 9.8 04/13/2018 1517   CALCIUM 9.0 05/19/2012 1957   ALKPHOS 76 04/13/2018 1517   ALKPHOS 61 05/19/2012 1957   AST 21 04/13/2018 1517   AST 31 05/19/2012 1957   ALT 21 04/13/2018 1517   ALT 28 05/19/2012 1957   BILITOT 0.3 04/13/2018 1517   BILITOT 0.3 05/19/2012 1957        ASSESSMENT & PLAN:   Hemochromatosis, hereditary ([place]) # Hereditary hemochromatosis- homozygous [as per patient]-without organ dysfunction; goal ferritin less than 100/saturation less than 50.   Today ferritin is 108 saturation 68%; proceed with phlebotomy today.  #Mild nonspecific leukopenia 3.2 white count; previous large lymphocytes seen on bone marrow biopsy 2012; monitor for now.  Asymptomatic  #Phlebotomy every 2 months [blood clinic]; follow-up with me in 6 months iron studies CBC phlebotomy few days prior    Cammie Sickle, MD 04/19/2018 7:41 AM

## 2018-04-18 NOTE — Assessment & Plan Note (Addendum)
#  Hereditary hemochromatosis- homozygous [as per patient]-without organ dysfunction; goal ferritin less than 100/saturation less than 50.   Today ferritin is 108 saturation 68%; proceed with phlebotomy today.  #Mild nonspecific leukopenia 3.2 white count; previous large lymphocytes seen on bone marrow biopsy 2012; monitor for now.  Asymptomatic  #Phlebotomy every 2 months [blood clinic]; follow-up with me in 6 months iron studies CBC phlebotomy few days prior

## 2018-04-24 ENCOUNTER — Other Ambulatory Visit: Payer: Self-pay | Admitting: Obstetrics and Gynecology

## 2018-04-24 DIAGNOSIS — Z1231 Encounter for screening mammogram for malignant neoplasm of breast: Secondary | ICD-10-CM

## 2018-06-05 ENCOUNTER — Ambulatory Visit
Admission: RE | Admit: 2018-06-05 | Discharge: 2018-06-05 | Disposition: A | Discharge status: Home / Self Care | Payer: Managed Care, Other (non HMO) | Source: Ambulatory Visit | Attending: Obstetrics and Gynecology | Admitting: Obstetrics and Gynecology

## 2018-06-05 DIAGNOSIS — Z1231 Encounter for screening mammogram for malignant neoplasm of breast: Secondary | ICD-10-CM | POA: Diagnosis not present

## 2018-07-09 ENCOUNTER — Inpatient Hospital Stay: Payer: Managed Care, Other (non HMO)

## 2018-07-09 ENCOUNTER — Inpatient Hospital Stay: Payer: Managed Care, Other (non HMO) | Attending: Internal Medicine

## 2018-07-09 LAB — CBC WITH DIFFERENTIAL/PLATELET
Basophils Absolute: 0 10*3/uL (ref 0–0.1)
Basophils Relative: 0 %
Eosinophils Absolute: 0.1 10*3/uL (ref 0–0.7)
Eosinophils Relative: 4 %
HCT: 34.2 % — ABNORMAL LOW (ref 35.0–47.0)
Hemoglobin: 11.8 g/dL — ABNORMAL LOW (ref 12.0–16.0)
Lymphocytes Relative: 33 %
Lymphs Abs: 1.2 10*3/uL (ref 1.0–3.6)
MCH: 32.7 pg (ref 26.0–34.0)
MCHC: 34.5 g/dL (ref 32.0–36.0)
MCV: 94.6 fL (ref 80.0–100.0)
Monocytes Absolute: 0.4 10*3/uL (ref 0.2–0.9)
Monocytes Relative: 11 %
Neutro Abs: 1.9 10*3/uL (ref 1.4–6.5)
Neutrophils Relative %: 52 %
Platelets: 264 10*3/uL (ref 150–440)
RBC: 3.61 MIL/uL — ABNORMAL LOW (ref 3.80–5.20)
RDW: 12.6 % (ref 11.5–14.5)
WBC: 3.6 10*3/uL (ref 3.6–11.0)

## 2018-09-03 ENCOUNTER — Inpatient Hospital Stay: Payer: Managed Care, Other (non HMO) | Attending: Internal Medicine

## 2018-09-03 ENCOUNTER — Inpatient Hospital Stay: Payer: Managed Care, Other (non HMO)

## 2018-09-03 LAB — CBC WITH DIFFERENTIAL/PLATELET
Basophils Absolute: 0 10*3/uL (ref 0–0.1)
Basophils Relative: 0 %
Eosinophils Absolute: 0.1 10*3/uL (ref 0–0.7)
Eosinophils Relative: 4 %
HCT: 31.2 % — ABNORMAL LOW (ref 35.0–47.0)
Hemoglobin: 11.1 g/dL — ABNORMAL LOW (ref 12.0–16.0)
Lymphocytes Relative: 27 %
Lymphs Abs: 0.9 10*3/uL — ABNORMAL LOW (ref 1.0–3.6)
MCH: 33.6 pg (ref 26.0–34.0)
MCHC: 35.6 g/dL (ref 32.0–36.0)
MCV: 94.3 fL (ref 80.0–100.0)
Monocytes Absolute: 0.4 10*3/uL (ref 0.2–0.9)
Monocytes Relative: 12 %
Neutro Abs: 2 10*3/uL (ref 1.4–6.5)
Neutrophils Relative %: 57 %
Platelets: 238 10*3/uL (ref 150–440)
RBC: 3.3 MIL/uL — ABNORMAL LOW (ref 3.80–5.20)
RDW: 12.9 % (ref 11.5–14.5)
WBC: 3.5 10*3/uL — ABNORMAL LOW (ref 3.6–11.0)

## 2018-09-03 NOTE — Progress Notes (Signed)
Patient here for [facility] phlebotomy clinic today.  The parameters in his chart for our office is based on ferritin.  Only CBC was drawn today not a ferritin.  Confirmed with Dr. Rogue Bussing that the [facility] phlebotomy clinic is to proceed with their guidelines regarding need for phlebotomy today.

## 2018-11-02 ENCOUNTER — Inpatient Hospital Stay: Payer: Managed Care, Other (non HMO) | Attending: Internal Medicine

## 2018-11-02 DIAGNOSIS — D72819 Decreased white blood cell count, unspecified: Secondary | ICD-10-CM | POA: Insufficient documentation

## 2018-11-02 DIAGNOSIS — Z8673 Personal history of transient ischemic attack (TIA), and cerebral infarction without residual deficits: Secondary | ICD-10-CM | POA: Diagnosis not present

## 2018-11-02 LAB — IRON AND TIBC
Iron: 210 ug/dL — ABNORMAL HIGH (ref 28–170)
Saturation Ratios: 63 % — ABNORMAL HIGH (ref 10.4–31.8)
TIBC: 333 ug/dL (ref 250–450)
UIBC: 123 ug/dL

## 2018-11-02 LAB — CBC WITH DIFFERENTIAL/PLATELET
Abs Immature Granulocytes: 0 10*3/uL (ref 0.00–0.07)
Basophils Absolute: 0 10*3/uL (ref 0.0–0.1)
Basophils Relative: 0 %
Eosinophils Absolute: 0.2 10*3/uL (ref 0.0–0.5)
Eosinophils Relative: 6 %
HCT: 36.6 % (ref 36.0–46.0)
Hemoglobin: 12 g/dL (ref 12.0–15.0)
Immature Granulocytes: 0 %
Lymphocytes Relative: 29 %
Lymphs Abs: 1.1 10*3/uL (ref 0.7–4.0)
MCH: 31.6 pg (ref 26.0–34.0)
MCHC: 32.8 g/dL (ref 30.0–36.0)
MCV: 96.3 fL (ref 80.0–100.0)
Monocytes Absolute: 0.4 10*3/uL (ref 0.1–1.0)
Monocytes Relative: 11 %
Neutro Abs: 2 10*3/uL (ref 1.7–7.7)
Neutrophils Relative %: 54 %
Platelets: 302 10*3/uL (ref 150–400)
RBC: 3.8 MIL/uL — ABNORMAL LOW (ref 3.87–5.11)
RDW: 12.4 % (ref 11.5–15.5)
WBC: 3.6 10*3/uL — ABNORMAL LOW (ref 4.0–10.5)
nRBC: 0 % (ref 0.0–0.2)

## 2018-11-02 LAB — COMPREHENSIVE METABOLIC PANEL
ALT: 55 U/L — ABNORMAL HIGH (ref 0–44)
AST: 44 U/L — ABNORMAL HIGH (ref 15–41)
Albumin: 4.6 g/dL (ref 3.5–5.0)
Alkaline Phosphatase: 67 U/L (ref 38–126)
Anion gap: 6 (ref 5–15)
BUN: 27 mg/dL — ABNORMAL HIGH (ref 8–23)
CO2: 26 mmol/L (ref 22–32)
Calcium: 10 mg/dL (ref 8.9–10.3)
Chloride: 105 mmol/L (ref 98–111)
Creatinine, Ser: 1.35 mg/dL — ABNORMAL HIGH (ref 0.44–1.00)
GFR calc Af Amer: 47 mL/min — ABNORMAL LOW (ref >60)
GFR calc non Af Amer: 41 mL/min — ABNORMAL LOW (ref >60)
Glucose, Bld: 94 mg/dL (ref 70–99)
Potassium: 4.6 mmol/L (ref 3.5–5.1)
Sodium: 137 mmol/L (ref 135–145)
Total Bilirubin: 0.6 mg/dL (ref 0.3–1.2)
Total Protein: 7.9 g/dL (ref 6.5–8.1)

## 2018-11-02 LAB — FERRITIN: Ferritin: 267 ng/mL (ref 11–307)

## 2018-11-05 ENCOUNTER — Inpatient Hospital Stay: Payer: Managed Care, Other (non HMO)

## 2018-11-05 ENCOUNTER — Inpatient Hospital Stay: Payer: Managed Care, Other (non HMO) | Admitting: Internal Medicine

## 2018-11-05 NOTE — Assessment & Plan Note (Deleted)
#  Hereditary hemochromatosis- homozygous [as per patient]-without organ dysfunction; goal ferritin less than 100/saturation less than 50.   Today ferritin is 108 saturation 68%; proceed with phlebotomy today.  #Mild nonspecific leukopenia 3.2 white count; previous large lymphocytes seen on bone marrow biopsy 2012; monitor for now.  Asymptomatic  #Phlebotomy every 2 months [blood clinic]; follow-up with me in 6 months iron studies CBC phlebotomy few days prior

## 2018-11-05 NOTE — Progress Notes (Deleted)
[place] OFFICE PROGRESS NOTE  Patient Care Team: [name], MD as PCP - General (Family Medicine) Inez Pilgrim Simonne Come, MD (Internal Medicine) Christene Lye, MD (General Surgery)   SUMMARY OF HEMATOLOGIC-ONCOLOGIC HISTORY:  # 2002- HEREDITARY HEMOCHROMATOSIS HOMOZYGOUS [goal ferritin 100-150]    # 2012- BMbx [sec to Anemia]-Normo-cellular; T Large Granular Lymphocytes by flowcytometry [4.5%] primary vs Reactive; FISH MDS- NEG  # Hx of Stroke [2012; no deficits]     INTERVAL HISTORY:  63 year old female patient with a prior history of homozygous hereditary hemochromatosis is here for follow-up.  Patient denies any swelling in the legs but denies any skin changes.  No weight loss or weight gain.  No fevers or chills.   Review of Systems  Constitutional: Negative for chills, diaphoresis, fever, malaise/fatigue and weight loss.  HENT: Negative for nosebleeds and sore throat.   Eyes: Negative for double vision.  Respiratory: Negative for cough, hemoptysis, sputum production, shortness of breath and wheezing.   Cardiovascular: Negative for chest pain, palpitations, orthopnea and leg swelling.  Gastrointestinal: Negative for abdominal pain, blood in stool, constipation, diarrhea, heartburn, melena, nausea and vomiting.  Genitourinary: Negative for dysuria, frequency and urgency.  Musculoskeletal: Negative for back pain and joint pain.  Skin: Negative.  Negative for itching and rash.  Neurological: Negative for dizziness, tingling, focal weakness, weakness and headaches.  Endo/Heme/Allergies: Does not bruise/bleed easily.  Psychiatric/Behavioral: Negative for depression. The patient is not nervous/anxious and does not have insomnia.       PAST MEDICAL HISTORY :  Past Medical History:  Diagnosis Date  . Abnormal CT scan, sinus    VASCULAR ABNORMALITY  . Anemia   . Back pain   . Breast cyst 8+ years    HAD SURGICAL F/U WITH DR Pat Patrick   . Elevated  ferritin level   . Hemochromatosis    Homozygous on genetic testing March 2002  . Hypertension     PAST SURGICAL HISTORY :   Past Surgical History:  Procedure Laterality Date  . BONE MARROW BIOPSY  01/26/2011   FLOW SHOWS POSITIVE FOR LGL'S, FISH IS NEG,  . BREAST EXCISIONAL BIOPSY Right 2011   Negative  . CHOLECYSTECTOMY    . COLONOSCOPY  07/2011  . ESOPHAGOGASTRODUODENOSCOPY  07/2011    FAMILY HISTORY :   Family History  Problem Relation Age of Onset  . Ovarian cancer Mother        BRCA status reported negative  . Breast cancer Maternal Aunt 70    SOCIAL HISTORY:   Social History   Tobacco Use  . Smoking status: Never Smoker  . Smokeless tobacco: Never Used  Substance Use Topics  . Alcohol use: Yes  . Drug use: No    ALLERGIES:  is allergic to amlodipine; hydromorphone; norvasc [amlodipine besylate]; and sulfa antibiotics.  MEDICATIONS:  Current Outpatient Medications  Medication Sig Dispense Refill  . ALPRAZolam (XANAX) 0.25 MG tablet Take 0.25 mg by mouth at bedtime as needed for anxiety. 0.5 tablet to 1 whole tablet every 12 hours as needed for anxiety    . aspirin 81 MG chewable tablet Chew 81 mg by mouth once.     . Biotin 1 MG CAPS Take 1 capsule by mouth 1 day or 1 dose.    . Cholecalciferol 1000 UNITS capsule Take 1,000 Units by mouth 2 (two) times daily.    . diazepam (VALIUM) 2 MG tablet Take 1 tablet (2 mg total) by mouth every 8 (eight) hours as needed for muscle  spasms. (Patient not taking: Reported on 04/18/2018) 10 tablet 0  . ketorolac (TORADOL) 10 MG tablet Take 1 tablet (10 mg total) by mouth every 8 (eight) hours. (Patient not taking: Reported on 04/18/2018) 15 tablet 0  . levothyroxine (SYNTHROID, LEVOTHROID) 50 MCG tablet Take 1 tablet by mouth daily.    Marland Kitchen lisinopril (PRINIVIL,ZESTRIL) 20 MG tablet Take 20 mg by mouth daily.    Marland Kitchen MELATONIN GUMMIES PO Take 2 each by mouth at bedtime.    Marland Kitchen omeprazole (PRILOSEC) 20 MG capsule Take 1 capsule by mouth  daily as needed for heartburn.    . triamcinolone (NASACORT) 55 MCG/ACT AERO nasal inhaler Place 1 spray into the nose as needed.     . venlafaxine (EFFEXOR) 75 MG tablet Take 75 mg by mouth 2 (two) times daily with a meal.      No current facility-administered medications for this visit.     PHYSICAL EXAMINATION: ECOG PERFORMANCE STATUS: 0 - Asymptomatic  There were no vitals taken for this visit.  There were no vitals filed for this visit.  GENERAL: Well-nourished well-developed; Alert, no distress and comfortable.  A alone. EYES: no pallor or icterus OROPHARYNX: no thrush or ulceration; NECK: supple; no lymph nodes felt. LYMPH:  no palpable lymphadenopathy in the axillary or inguinal regions LUNGS: Decreased breath sounds auscultation bilaterally. No wheeze or crackles HEART/CVS: regular rate & rhythm and no murmurs; No lower extremity edema ABDOMEN:abdomen soft, non-tender and normal bowel sounds. No hepatomegaly or splenomegaly.  Musculoskeletal:no cyanosis of digits and no clubbing  PSYCH: alert & oriented x 3 with fluent speech NEURO: no focal motor/sensory deficits SKIN:  no rashes or significant lesions  LABORATORY DATA:  I have reviewed the data as listed    Component Value Date/Time   NA 137 11/02/2018 1128   NA 141 04/13/2018 1517   NA 142 05/19/2012 1957   K 4.6 11/02/2018 1128   K 4.6 05/19/2012 1957   CL 105 11/02/2018 1128   CL 108 (H) 05/19/2012 1957   CO2 26 11/02/2018 1128   CO2 26 05/19/2012 1957   GLUCOSE 94 11/02/2018 1128   GLUCOSE 94 05/19/2012 1957   BUN 27 (H) 11/02/2018 1128   BUN 34 (H) 04/13/2018 1517   BUN 10 05/19/2012 1957   CREATININE 1.35 (H) 11/02/2018 1128   CREATININE 1.09 05/19/2012 1957   CALCIUM 10.0 11/02/2018 1128   CALCIUM 9.0 05/19/2012 1957   PROT 7.9 11/02/2018 1128   PROT 7.3 04/13/2018 1517   PROT 6.2 (L) 05/19/2012 1957   ALBUMIN 4.6 11/02/2018 1128   ALBUMIN 4.9 (H) 04/13/2018 1517   ALBUMIN 3.4 05/19/2012 1957    AST 44 (H) 11/02/2018 1128   AST 31 05/19/2012 1957   ALT 55 (H) 11/02/2018 1128   ALT 28 05/19/2012 1957   ALKPHOS 67 11/02/2018 1128   ALKPHOS 61 05/19/2012 1957   BILITOT 0.6 11/02/2018 1128   BILITOT 0.3 04/13/2018 1517   BILITOT 0.3 05/19/2012 1957   GFRNONAA 41 (L) 11/02/2018 1128   GFRNONAA 56 (L) 05/19/2012 1957   GFRAA 47 (L) 11/02/2018 1128   GFRAA >60 05/19/2012 1957    No results found for: SPEP, UPEP  Lab Results  Component Value Date   WBC 3.6 (L) 11/02/2018   NEUTROABS 2.0 11/02/2018   HGB 12.0 11/02/2018   HCT 36.6 11/02/2018   MCV 96.3 11/02/2018   PLT 302 11/02/2018      Chemistry      Component Value Date/Time  NA 137 11/02/2018 1128   NA 141 04/13/2018 1517   NA 142 05/19/2012 1957   K 4.6 11/02/2018 1128   K 4.6 05/19/2012 1957   CL 105 11/02/2018 1128   CL 108 (H) 05/19/2012 1957   CO2 26 11/02/2018 1128   CO2 26 05/19/2012 1957   BUN 27 (H) 11/02/2018 1128   BUN 34 (H) 04/13/2018 1517   BUN 10 05/19/2012 1957   CREATININE 1.35 (H) 11/02/2018 1128   CREATININE 1.09 05/19/2012 1957      Component Value Date/Time   CALCIUM 10.0 11/02/2018 1128   CALCIUM 9.0 05/19/2012 1957   ALKPHOS 67 11/02/2018 1128   ALKPHOS 61 05/19/2012 1957   AST 44 (H) 11/02/2018 1128   AST 31 05/19/2012 1957   ALT 55 (H) 11/02/2018 1128   ALT 28 05/19/2012 1957   BILITOT 0.6 11/02/2018 1128   BILITOT 0.3 04/13/2018 1517   BILITOT 0.3 05/19/2012 1957        ASSESSMENT & PLAN:   No problem-specific Assessment & Plan notes found for this encounter.    Cammie Sickle, MD 11/05/2018 12:56 PM

## 2018-11-07 ENCOUNTER — Encounter: Payer: Self-pay | Admitting: Internal Medicine

## 2018-11-07 ENCOUNTER — Inpatient Hospital Stay: Payer: Managed Care, Other (non HMO)

## 2018-11-07 ENCOUNTER — Other Ambulatory Visit: Payer: Self-pay

## 2018-11-07 ENCOUNTER — Inpatient Hospital Stay (HOSPITAL_BASED_OUTPATIENT_CLINIC_OR_DEPARTMENT_OTHER): Payer: Managed Care, Other (non HMO) | Admitting: Internal Medicine

## 2018-11-07 DIAGNOSIS — D72819 Decreased white blood cell count, unspecified: Secondary | ICD-10-CM

## 2018-11-07 DIAGNOSIS — Z8673 Personal history of transient ischemic attack (TIA), and cerebral infarction without residual deficits: Secondary | ICD-10-CM | POA: Diagnosis not present

## 2018-11-07 NOTE — Progress Notes (Signed)
[facility] OFFICE PROGRESS NOTE  Patient Care Team: [name], MD as PCP - General (Family Medicine) Inez Pilgrim Simonne Come, MD (Internal Medicine) Christene Lye, MD (General Surgery)   SUMMARY OF HEMATOLOGIC-ONCOLOGIC HISTORY:  # 2002- HEREDITARY HEMOCHROMATOSIS HOMOZYGOUS [goal ferritin 100-150]    # 2012- BMbx [sec to Anemia]-Normo-cellular; T Large Granular Lymphocytes by flowcytometry [4.5%] primary vs Reactive; FISH MDS- NEG  # Hx of Stroke [2012; no deficits]     INTERVAL HISTORY:  62 year old female patient with a prior history of homozygous hereditary hemochromatosis is here for follow-up.  Patient denies any unusual nausea vomiting.  Denies abdominal pain.  No fevers or chills.  Denies any alcohol use.  Review of Systems  Constitutional: Negative for chills, diaphoresis, fever, malaise/fatigue and weight loss.  HENT: Negative for nosebleeds and sore throat.   Eyes: Negative for double vision.  Respiratory: Negative for cough, hemoptysis, sputum production, shortness of breath and wheezing.   Cardiovascular: Negative for chest pain, palpitations, orthopnea and leg swelling.  Gastrointestinal: Negative for abdominal pain, blood in stool, constipation, diarrhea, heartburn, melena, nausea and vomiting.  Genitourinary: Negative for dysuria, frequency and urgency.  Musculoskeletal: Negative for back pain and joint pain.  Skin: Negative.  Negative for itching and rash.  Neurological: Negative for dizziness, tingling, focal weakness, weakness and headaches.  Endo/Heme/Allergies: Does not bruise/bleed easily.  Psychiatric/Behavioral: Negative for depression. The patient is not nervous/anxious and does not have insomnia.       PAST MEDICAL HISTORY :  Past Medical History:  Diagnosis Date  . Abnormal CT scan, sinus    VASCULAR ABNORMALITY  . Anemia   . Back pain   . Breast cyst 8+ years    HAD SURGICAL F/U WITH DR Pat Patrick   . Elevated ferritin  level   . Hemochromatosis    Homozygous on genetic testing March 2002  . Hypertension     PAST SURGICAL HISTORY :   Past Surgical History:  Procedure Laterality Date  . BONE MARROW BIOPSY  01/26/2011   FLOW SHOWS POSITIVE FOR LGL'S, FISH IS NEG,  . BREAST EXCISIONAL BIOPSY Right 2011   Negative  . CHOLECYSTECTOMY    . COLONOSCOPY  07/2011  . ESOPHAGOGASTRODUODENOSCOPY  07/2011    FAMILY HISTORY :   Family History  Problem Relation Age of Onset  . Ovarian cancer Mother        BRCA status reported negative  . Breast cancer Maternal Aunt 70    SOCIAL HISTORY:   Social History   Tobacco Use  . Smoking status: Never Smoker  . Smokeless tobacco: Never Used  Substance Use Topics  . Alcohol use: Yes  . Drug use: No    ALLERGIES:  is allergic to amlodipine; hydromorphone; norvasc [amlodipine besylate]; and sulfa antibiotics.  MEDICATIONS:  Current Outpatient Medications  Medication Sig Dispense Refill  . ALPRAZolam (XANAX) 0.25 MG tablet Take 0.25 mg by mouth at bedtime as needed for anxiety. 0.5 tablet to 1 whole tablet every 12 hours as needed for anxiety    . aspirin 81 MG chewable tablet Chew 81 mg by mouth once.     . Biotin 1 MG CAPS Take 1 capsule by mouth 1 day or 1 dose.    . Cholecalciferol 1000 UNITS capsule Take 1,000 Units by mouth 2 (two) times daily.    Marland Kitchen levothyroxine (SYNTHROID, LEVOTHROID) 50 MCG tablet Take 1 tablet by mouth daily.    Marland Kitchen lisinopril (PRINIVIL,ZESTRIL) 20 MG tablet Take 20 mg by mouth  daily.    . venlafaxine (EFFEXOR) 75 MG tablet Take 75 mg by mouth 2 (two) times daily with a meal.     . diazepam (VALIUM) 2 MG tablet Take 1 tablet (2 mg total) by mouth every 8 (eight) hours as needed for muscle spasms. (Patient not taking: Reported on 04/18/2018) 10 tablet 0  . ketorolac (TORADOL) 10 MG tablet Take 1 tablet (10 mg total) by mouth every 8 (eight) hours. (Patient not taking: Reported on 04/18/2018) 15 tablet 0  . omeprazole (PRILOSEC) 20 MG capsule  Take 1 capsule by mouth daily as needed for heartburn.    . triamcinolone (NASACORT) 55 MCG/ACT AERO nasal inhaler Place 1 spray into the nose as needed.      No current facility-administered medications for this visit.     PHYSICAL EXAMINATION: ECOG PERFORMANCE STATUS: 0 - Asymptomatic  BP 123/81 (BP Location: Left Arm, Patient Position: Sitting)   Pulse 80   Temp 97.6 F (36.4 C) (Tympanic)   Resp 18   Ht _0  (1.575 m)   Wt 150 lb (68 kg)   BMI 27.44 kg/m   Filed Weights   11/07/18 0915  Weight: 150 lb (68 kg)    Physical Exam  Constitutional: She is oriented to person, place, and time and well-developed, well-nourished, and in no distress.  HENT:  Head: Normocephalic and atraumatic.  Mouth/Throat: Oropharynx is clear and moist. No oropharyngeal exudate.  Eyes: Pupils are equal, round, and reactive to light.  Neck: Normal range of motion. Neck supple.  Cardiovascular: Normal rate and regular rhythm.  Pulmonary/Chest: No respiratory distress. She has no wheezes.  Abdominal: Soft. Bowel sounds are normal. She exhibits no distension and no mass. There is no tenderness. There is no rebound and no guarding.  Musculoskeletal: Normal range of motion. She exhibits no edema or tenderness.  Neurological: She is alert and oriented to person, place, and time.  Skin: Skin is warm.  Psychiatric: Affect normal.     LABORATORY DATA:  I have reviewed the data as listed    Component Value Date/Time   NA 137 11/02/2018 1128   NA 141 04/13/2018 1517   NA 142 05/19/2012 1957   K 4.6 11/02/2018 1128   K 4.6 05/19/2012 1957   CL 105 11/02/2018 1128   CL 108 (H) 05/19/2012 1957   CO2 26 11/02/2018 1128   CO2 26 05/19/2012 1957   GLUCOSE 94 11/02/2018 1128   GLUCOSE 94 05/19/2012 1957   BUN 27 (H) 11/02/2018 1128   BUN 34 (H) 04/13/2018 1517   BUN 10 05/19/2012 1957   CREATININE 1.35 (H) 11/02/2018 1128   CREATININE 1.09 05/19/2012 1957   CALCIUM 10.0 11/02/2018 1128    CALCIUM 9.0 05/19/2012 1957   PROT 7.9 11/02/2018 1128   PROT 7.3 04/13/2018 1517   PROT 6.2 (L) 05/19/2012 1957   ALBUMIN 4.6 11/02/2018 1128   ALBUMIN 4.9 (H) 04/13/2018 1517   ALBUMIN 3.4 05/19/2012 1957   AST 44 (H) 11/02/2018 1128   AST 31 05/19/2012 1957   ALT 55 (H) 11/02/2018 1128   ALT 28 05/19/2012 1957   ALKPHOS 67 11/02/2018 1128   ALKPHOS 61 05/19/2012 1957   BILITOT 0.6 11/02/2018 1128   BILITOT 0.3 04/13/2018 1517   BILITOT 0.3 05/19/2012 1957   GFRNONAA 41 (L) 11/02/2018 1128   GFRNONAA 56 (L) 05/19/2012 1957   GFRAA 47 (L) 11/02/2018 1128   GFRAA >60 05/19/2012 1957    No results found for: SPEP, UPEP  Lab Results  Component Value Date   WBC 3.6 (L) 11/02/2018   NEUTROABS 2.0 11/02/2018   HGB 12.0 11/02/2018   HCT 36.6 11/02/2018   MCV 96.3 11/02/2018   PLT 302 11/02/2018      Chemistry      Component Value Date/Time   NA 137 11/02/2018 1128   NA 141 04/13/2018 1517   NA 142 05/19/2012 1957   K 4.6 11/02/2018 1128   K 4.6 05/19/2012 1957   CL 105 11/02/2018 1128   CL 108 (H) 05/19/2012 1957   CO2 26 11/02/2018 1128   CO2 26 05/19/2012 1957   BUN 27 (H) 11/02/2018 1128   BUN 34 (H) 04/13/2018 1517   BUN 10 05/19/2012 1957   CREATININE 1.35 (H) 11/02/2018 1128   CREATININE 1.09 05/19/2012 1957      Component Value Date/Time   CALCIUM 10.0 11/02/2018 1128   CALCIUM 9.0 05/19/2012 1957   ALKPHOS 67 11/02/2018 1128   ALKPHOS 61 05/19/2012 1957   AST 44 (H) 11/02/2018 1128   AST 31 05/19/2012 1957   ALT 55 (H) 11/02/2018 1128   ALT 28 05/19/2012 1957   BILITOT 0.6 11/02/2018 1128   BILITOT 0.3 04/13/2018 1517   BILITOT 0.3 05/19/2012 1957        ASSESSMENT & PLAN:   Hemochromatosis, hereditary ([place]) # Hereditary hemochromatosis- homozygous [as per patient]-without organ dysfunction; goal ferritin less than 100/saturation less than 50. STABLE.   Today ferritin is 267 saturation 63%; proceed with phlebotomy today.  # Mild  nonspecific leukopenia 3.6white count;STABLE; monitor for now.   # Mild elevated LFTs- ? Fatty liver vs others. Denies alcohol abuse;  New- Re-check in 3 months.   # DISPOSITION: # Phlelbotomy today # labs- LFTs in 3 months # in 6 months- MD/labs- cbc/cmp/Iron studies/ferritin- few days prior/ possible phlebotomy-Dr.B    Cammie Sickle, MD 11/13/2018 8:09 PM

## 2018-11-07 NOTE — Assessment & Plan Note (Addendum)
#   Hereditary hemochromatosis- homozygous [as per patient]-without organ dysfunction; goal ferritin less than 100/saturation less than 50. STABLE.   Today ferritin is 267 saturation 63%; proceed with phlebotomy today.  # Mild nonspecific leukopenia 3.6white count;STABLE; monitor for now.   # Mild elevated LFTs- ? Fatty liver vs others. Denies alcohol abuse;  New- Re-check in 3 months.   # DISPOSITION: # Phlelbotomy today # labs- LFTs in 3 months # in 6 months- MD/labs- cbc/cmp/Iron studies/ferritin- few days prior/ possible phlebotomy-Dr.B

## 2019-02-06 ENCOUNTER — Inpatient Hospital Stay: Payer: Managed Care, Other (non HMO) | Attending: Internal Medicine

## 2019-02-06 LAB — HEPATIC FUNCTION PANEL
ALT: 21 U/L (ref 0–44)
AST: 23 U/L (ref 15–41)
Albumin: 4.5 g/dL (ref 3.5–5.0)
Alkaline Phosphatase: 68 U/L (ref 38–126)
Bilirubin, Direct: <0.1 mg/dL (ref 0.0–0.2)
Total Bilirubin: 0.7 mg/dL (ref 0.3–1.2)
Total Protein: 7.8 g/dL (ref 6.5–8.1)

## 2019-04-22 ENCOUNTER — Inpatient Hospital Stay: Payer: Managed Care, Other (non HMO) | Attending: Internal Medicine

## 2019-04-22 ENCOUNTER — Other Ambulatory Visit: Payer: Self-pay

## 2019-04-22 DIAGNOSIS — D72819 Decreased white blood cell count, unspecified: Secondary | ICD-10-CM | POA: Insufficient documentation

## 2019-04-22 LAB — COMPREHENSIVE METABOLIC PANEL
ALT: 24 U/L (ref 0–44)
AST: 25 U/L (ref 15–41)
Albumin: 4.3 g/dL (ref 3.5–5.0)
Alkaline Phosphatase: 52 U/L (ref 38–126)
Anion gap: 9 (ref 5–15)
BUN: 31 mg/dL — ABNORMAL HIGH (ref 8–23)
CO2: 25 mmol/L (ref 22–32)
Calcium: 9.6 mg/dL (ref 8.9–10.3)
Chloride: 109 mmol/L (ref 98–111)
Creatinine, Ser: 1.12 mg/dL — ABNORMAL HIGH (ref 0.44–1.00)
GFR calc Af Amer: >60 mL/min (ref >60)
GFR calc non Af Amer: 52 mL/min — ABNORMAL LOW (ref >60)
Glucose, Bld: 104 mg/dL — ABNORMAL HIGH (ref 70–99)
Potassium: 4 mmol/L (ref 3.5–5.1)
Sodium: 143 mmol/L (ref 135–145)
Total Bilirubin: 0.6 mg/dL (ref 0.3–1.2)
Total Protein: 7.5 g/dL (ref 6.5–8.1)

## 2019-04-22 LAB — IRON AND TIBC
Iron: 174 ug/dL — ABNORMAL HIGH (ref 28–170)
Saturation Ratios: 58 % — ABNORMAL HIGH (ref 10.4–31.8)
TIBC: 300 ug/dL (ref 250–450)
UIBC: 126 ug/dL

## 2019-04-22 LAB — CBC WITH DIFFERENTIAL/PLATELET
Abs Immature Granulocytes: 0.01 10*3/uL (ref 0.00–0.07)
Basophils Absolute: 0 10*3/uL (ref 0.0–0.1)
Basophils Relative: 0 %
Eosinophils Absolute: 0 10*3/uL (ref 0.0–0.5)
Eosinophils Relative: 0 %
HCT: 33 % — ABNORMAL LOW (ref 36.0–46.0)
Hemoglobin: 11.2 g/dL — ABNORMAL LOW (ref 12.0–15.0)
Immature Granulocytes: 0 %
Lymphocytes Relative: 41 %
Lymphs Abs: 1.3 10*3/uL (ref 0.7–4.0)
MCH: 32 pg (ref 26.0–34.0)
MCHC: 33.9 g/dL (ref 30.0–36.0)
MCV: 94.3 fL (ref 80.0–100.0)
Monocytes Absolute: 0.4 10*3/uL (ref 0.1–1.0)
Monocytes Relative: 11 %
Neutro Abs: 1.5 10*3/uL — ABNORMAL LOW (ref 1.7–7.7)
Neutrophils Relative %: 48 %
Platelets: 243 10*3/uL (ref 150–400)
RBC: 3.5 MIL/uL — ABNORMAL LOW (ref 3.87–5.11)
RDW: 12.6 % (ref 11.5–15.5)
WBC: 3.1 10*3/uL — ABNORMAL LOW (ref 4.0–10.5)
nRBC: 0 % (ref 0.0–0.2)

## 2019-04-22 LAB — FERRITIN: Ferritin: 218 ng/mL (ref 11–307)

## 2019-04-23 ENCOUNTER — Telehealth: Payer: Self-pay | Admitting: Internal Medicine

## 2019-04-24 ENCOUNTER — Encounter: Payer: Self-pay | Admitting: Internal Medicine

## 2019-04-24 ENCOUNTER — Other Ambulatory Visit: Payer: Self-pay

## 2019-04-24 ENCOUNTER — Inpatient Hospital Stay (HOSPITAL_BASED_OUTPATIENT_CLINIC_OR_DEPARTMENT_OTHER): Payer: Managed Care, Other (non HMO) | Admitting: Internal Medicine

## 2019-04-24 DIAGNOSIS — D72819 Decreased white blood cell count, unspecified: Secondary | ICD-10-CM | POA: Diagnosis not present

## 2019-04-24 DIAGNOSIS — R1012 Left upper quadrant pain: Secondary | ICD-10-CM

## 2019-04-24 NOTE — Progress Notes (Signed)
I connected with Katie Wise on 04/24/19 at  9:30 AM EDT by video enabled telemedicine visit and verified that I am speaking with the correct person using two identifiers.  I discussed the limitations, risks, security and privacy concerns of performing an evaluation and management service by telemedicine and the availability of in-person appointments. I also discussed with the patient that there may be a patient responsible charge related to this service. The patient expressed understanding and agreed to proceed.    Other persons participating in the visit and their role in the encounter: None Patient's location: Home Provider's location: Office   No history exists.     Chief Complaint: Hemochromatosis   History of present illness:Katie Wise 63 y.o.  female with history of hemochromatosis currently on phlebotomies every 6 months or so.  Patient denies any abdominal discomfort denies any nausea vomiting.  Denies any swelling in the legs.  Denies any chest pain or shortness of breath.  Complains of intermittent discomfort in her left rib cage.  Not constant.  Denies any trauma.  Observation/objective:  Assessment and plan: Hemochromatosis, hereditary ([place]) # Hereditary hemochromatosis- homozygous [as per patient]-without organ dysfunction; goal ferritin less than 100/saturation less than 50.  Stable.  Today ferritin is 218 saturation 56%; HOLD phlebotomy today as hemoglobin slightly low at 11.6.  Patient agreement.  # Mild nonspecific leukopenia 3.1 stable.  #Left upper quadrant discomfort intermittent-unclear etiology none at this time.  Recommend to inform us/PCP if symptoms worsen.  # DISPOSITION: # in 6 months- MD/labs- cbc/cmp/Iron studies/ferritin- few days prior/ possible phlebotomy-Dr.B    Follow-up instructions:  I discussed the assessment and treatment plan with the patient.  The patient was provided an opportunity to ask questions and all were answered.  The  patient agreed with the plan and demonstrated understanding of instructions.  The patient was advised to call back or seek an in person evaluation if the symptoms worsen or if the condition fails to improve as anticipated.   Dr. Charlaine Dalton [place] at [facility] 04/24/2019 1:14 PM

## 2019-04-24 NOTE — Assessment & Plan Note (Addendum)
#   Hereditary hemochromatosis- homozygous [as per patient]-without organ dysfunction; goal ferritin less than 100/saturation less than 50.  Stable.  Today ferritin is 218 saturation 56%; HOLD phlebotomy today as hemoglobin slightly low at 11.6.  Patient agreement.  # Mild nonspecific leukopenia 3.1 stable.  #Left upper quadrant discomfort intermittent-unclear etiology none at this time.  Recommend to inform us/PCP if symptoms worsen.  # DISPOSITION: # in 6 months- MD/labs- cbc/cmp/Iron studies/ferritin- few days prior/ possible phlebotomy-Dr.B

## 2019-05-03 ENCOUNTER — Other Ambulatory Visit: Payer: Self-pay

## 2019-05-08 ENCOUNTER — Ambulatory Visit: Payer: Self-pay | Admitting: Internal Medicine

## 2019-07-19 ENCOUNTER — Other Ambulatory Visit: Payer: Self-pay

## 2019-07-19 DIAGNOSIS — Z20822 Contact with and (suspected) exposure to covid-19: Secondary | ICD-10-CM

## 2019-07-20 LAB — NOVEL CORONAVIRUS, NAA: SARS-CoV-2, NAA: NOT DETECTED

## 2019-09-24 ENCOUNTER — Other Ambulatory Visit: Payer: Self-pay | Admitting: Obstetrics and Gynecology

## 2019-09-24 DIAGNOSIS — Z1231 Encounter for screening mammogram for malignant neoplasm of breast: Secondary | ICD-10-CM

## 2019-09-25 ENCOUNTER — Ambulatory Visit
Admission: RE | Admit: 2019-09-25 | Discharge: 2019-09-25 | Disposition: A | Discharge status: Home / Self Care | Payer: Managed Care, Other (non HMO) | Source: Ambulatory Visit | Attending: Obstetrics and Gynecology | Admitting: Obstetrics and Gynecology

## 2019-09-25 DIAGNOSIS — Z1231 Encounter for screening mammogram for malignant neoplasm of breast: Secondary | ICD-10-CM | POA: Diagnosis not present

## 2019-10-22 ENCOUNTER — Other Ambulatory Visit: Payer: Self-pay

## 2019-10-23 ENCOUNTER — Inpatient Hospital Stay: Payer: Managed Care, Other (non HMO) | Attending: Internal Medicine

## 2019-10-23 ENCOUNTER — Other Ambulatory Visit: Payer: Self-pay

## 2019-10-23 LAB — CBC WITH DIFFERENTIAL/PLATELET
Abs Immature Granulocytes: 0.01 10*3/uL (ref 0.00–0.07)
Basophils Absolute: 0 10*3/uL (ref 0.0–0.1)
Basophils Relative: 0 %
Eosinophils Absolute: 0.1 10*3/uL (ref 0.0–0.5)
Eosinophils Relative: 2 %
HCT: 34.3 % — ABNORMAL LOW (ref 36.0–46.0)
Hemoglobin: 11.6 g/dL — ABNORMAL LOW (ref 12.0–15.0)
Immature Granulocytes: 0 %
Lymphocytes Relative: 35 %
Lymphs Abs: 1.3 10*3/uL (ref 0.7–4.0)
MCH: 32.7 pg (ref 26.0–34.0)
MCHC: 33.8 g/dL (ref 30.0–36.0)
MCV: 96.6 fL (ref 80.0–100.0)
Monocytes Absolute: 0.4 10*3/uL (ref 0.1–1.0)
Monocytes Relative: 12 %
Neutro Abs: 1.9 10*3/uL (ref 1.7–7.7)
Neutrophils Relative %: 51 %
Platelets: 275 10*3/uL (ref 150–400)
RBC: 3.55 MIL/uL — ABNORMAL LOW (ref 3.87–5.11)
RDW: 12.5 % (ref 11.5–15.5)
WBC: 3.7 10*3/uL — ABNORMAL LOW (ref 4.0–10.5)
nRBC: 0 % (ref 0.0–0.2)

## 2019-10-23 LAB — COMPREHENSIVE METABOLIC PANEL
ALT: 19 U/L (ref 0–44)
AST: 22 U/L (ref 15–41)
Albumin: 4.4 g/dL (ref 3.5–5.0)
Alkaline Phosphatase: 58 U/L (ref 38–126)
Anion gap: 6 (ref 5–15)
BUN: 31 mg/dL — ABNORMAL HIGH (ref 8–23)
CO2: 26 mmol/L (ref 22–32)
Calcium: 9.4 mg/dL (ref 8.9–10.3)
Chloride: 105 mmol/L (ref 98–111)
Creatinine, Ser: 1.18 mg/dL — ABNORMAL HIGH (ref 0.44–1.00)
GFR calc Af Amer: 56 mL/min — ABNORMAL LOW (ref >60)
GFR calc non Af Amer: 48 mL/min — ABNORMAL LOW (ref >60)
Glucose, Bld: 96 mg/dL (ref 70–99)
Potassium: 4.3 mmol/L (ref 3.5–5.1)
Sodium: 137 mmol/L (ref 135–145)
Total Bilirubin: 0.6 mg/dL (ref 0.3–1.2)
Total Protein: 7.5 g/dL (ref 6.5–8.1)

## 2019-10-23 LAB — FERRITIN: Ferritin: 247 ng/mL (ref 11–307)

## 2019-10-23 LAB — IRON AND TIBC
Iron: 155 ug/dL (ref 28–170)
Saturation Ratios: 48 % — ABNORMAL HIGH (ref 10.4–31.8)
TIBC: 324 ug/dL (ref 250–450)
UIBC: 169 ug/dL

## 2019-10-24 ENCOUNTER — Telehealth: Payer: Self-pay

## 2019-10-24 NOTE — Telephone Encounter (Signed)
Pre-visit assessment call was attempted prior to follow-up appointment with Dr. Rogue Bussing on 10/25/2019. No answer.

## 2019-10-25 ENCOUNTER — Inpatient Hospital Stay: Payer: Managed Care, Other (non HMO)

## 2019-10-25 ENCOUNTER — Inpatient Hospital Stay: Payer: Managed Care, Other (non HMO) | Admitting: Internal Medicine

## 2019-10-29 ENCOUNTER — Encounter: Payer: Self-pay | Admitting: Internal Medicine

## 2019-11-05 ENCOUNTER — Ambulatory Visit: Payer: Managed Care, Other (non HMO) | Admitting: Internal Medicine

## 2019-11-25 ENCOUNTER — Encounter: Payer: Self-pay | Admitting: Internal Medicine

## 2019-12-02 ENCOUNTER — Other Ambulatory Visit: Payer: Self-pay

## 2019-12-02 ENCOUNTER — Ambulatory Visit (INDEPENDENT_AMBULATORY_CARE_PROVIDER_SITE_OTHER): Payer: Medicare Other

## 2019-12-02 ENCOUNTER — Ambulatory Visit
Admission: EM | Admit: 2019-12-02 | Discharge: 2019-12-02 | Disposition: A | Discharge status: Home / Self Care | Payer: Medicare Other | Attending: Internal Medicine | Admitting: Internal Medicine

## 2019-12-02 DIAGNOSIS — J1282 Pneumonia due to coronavirus disease 2019: Secondary | ICD-10-CM

## 2019-12-02 DIAGNOSIS — B9689 Other specified bacterial agents as the cause of diseases classified elsewhere: Secondary | ICD-10-CM | POA: Diagnosis not present

## 2019-12-02 DIAGNOSIS — J1289 Other viral pneumonia: Secondary | ICD-10-CM

## 2019-12-02 DIAGNOSIS — U071 COVID-19: Secondary | ICD-10-CM

## 2019-12-02 DIAGNOSIS — J329 Chronic sinusitis, unspecified: Secondary | ICD-10-CM | POA: Diagnosis not present

## 2019-12-02 DIAGNOSIS — R05 Cough: Secondary | ICD-10-CM | POA: Diagnosis not present

## 2019-12-02 MED ORDER — AMOXICILLIN-POT CLAVULANATE 875-125 MG PO TABS: 1.0000 | ORAL_TABLET | Freq: Two times a day (BID) | ORAL | 0 refills | Status: DC

## 2019-12-02 MED ORDER — ALBUTEROL SULFATE HFA 108 (90 BASE) MCG/ACT IN AERS: 2.0000 | INHALATION_SPRAY | RESPIRATORY_TRACT | 0 refills | Status: DC | PRN

## 2019-12-02 MED ORDER — CEFTRIAXONE SODIUM 1 G IJ SOLR
1.0000 g | Freq: Once | INTRAMUSCULAR | Status: AC
  Administered 2019-12-02: 16:00:00 1 g via INTRAMUSCULAR

## 2019-12-02 MED ORDER — GUAIFENESIN-CODEINE 100-10 MG/5ML PO SOLN: 10.0000 mL | Freq: Every evening | ORAL | 0 refills | Status: DC | PRN

## 2019-12-02 NOTE — ED Triage Notes (Addendum)
Pt. States she tested POSITIVE on 12/ & states the [facility]. Informed her to come here to get a chest Xray to she if she has pneumonia.

## 2019-12-02 NOTE — ED Provider Notes (Signed)
[facility]    CSN: [id number] Arrival date & time: 12/02/19  1437      History   Chief Complaint Chief Complaint  Patient presents with  . Chest XRay    HPI Katie Wise is a 63 y.o. female. onset of sinus drainage x 3 weeks, had neg covid test 12/10, than again 12/18 due to having cough, loss of taste, fever of 101 and today was told she is positive. Because her cough has been getting worse, was told to come in and get a chest xray. Denies SOB unless she is having coughing fits. Her cough is slightly productive, but is not spitting the phlegm. Her sinus congestion is better since using the Nasalcort and Allegra D.     Past Medical History:  Diagnosis Date  . Abnormal CT scan, sinus    VASCULAR ABNORMALITY  . Anemia   . Back pain   . Breast cyst 8+ years    HAD SURGICAL F/U WITH DR Pat Patrick   . Elevated ferritin level   . Hemochromatosis    Homozygous on genetic testing March 2002  . Hypertension     Patient Active Problem List   Diagnosis Date Noted  . Cerebrovascular accident, 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  . Abnormal brain scan 08/07/2014  . Cephalalgia 08/07/2014  . Cerebral vascular accident ([place]) 08/07/2014  . Cerebrovascular accident (CVA) ([place]) 08/07/2014  . Anxiety state 05/01/2014  . Hemochromatosis, hereditary ([place]) 05/01/2014  . BP (high blood pressure) 05/01/2014  . Ache in joint 05/01/2014  . Anxiety 05/01/2014    Past Surgical History:  Procedure Laterality Date  . BONE MARROW BIOPSY  01/26/2011   FLOW SHOWS POSITIVE FOR LGL'S, FISH IS NEG,  . BREAST EXCISIONAL BIOPSY Right 2011   Negative  . CHOLECYSTECTOMY    . COLONOSCOPY  07/2011  . ESOPHAGOGASTRODUODENOSCOPY  07/2011    OB History   No obstetric history on file.      Home Medications    Prior to Admission medications   Medication Sig Start Date End Date Taking? Authorizing Provider  ALPRAZolam Duanne Moron) 0.25 MG tablet Take 0.25 mg by mouth at bedtime as needed for anxiety. 0.5 tablet to  1 whole tablet every 12 hours as needed for anxiety    [provider]  aspirin 81 MG chewable tablet Chew 81 mg by mouth once.     [provider]  Biotin 1 MG CAPS Take 1 capsule by mouth 1 day or 1 dose.    [provider]  Cholecalciferol 1000 UNITS capsule Take 1,000 Units by mouth 2 (two) times daily.    [provider]  levothyroxine (SYNTHROID, LEVOTHROID) 50 MCG tablet Take 1 tablet by mouth daily. 03/13/17   [provider]  lisinopril (PRINIVIL,ZESTRIL) 20 MG tablet Take 20 mg by mouth daily.    [provider]  triamcinolone (NASACORT) 55 MCG/ACT AERO nasal inhaler Place 1 spray into the nose as needed.     [provider]  venlafaxine (EFFEXOR) 75 MG tablet Take 75 mg by mouth 2 (two) times daily with a meal.     [provider]    Family History Family History  Problem Relation Age of Onset  . Ovarian cancer Mother        BRCA status reported negative  . Cancer Mother   . Heart disease Mother   . Cancer - Ovarian Mother   . Breast cancer Maternal Aunt 70  . Cancer Father   . Lung cancer Father  Social History Social History   Tobacco Use  . Smoking status: Never Smoker  . Smokeless tobacco: Never Used  Substance Use Topics  . Alcohol use: Yes  . Drug use: No     Allergies   Amlodipine, Hydromorphone, Norvasc [amlodipine besylate], and Sulfa antibiotics   Review of Systems Review of Systems + diarrhea 2 days and had 3 watery stool and only lasted one day, none since. Has been feeling weak, has no appetite. Denies vomiting. The cough makes her gag like she needs to vomit. Has had mild HA, no body aches. Has low back ache. Her L mid ribs have been sore from coughing so much.   Physical Exam Triage Vital Signs ED Triage Vitals  Enc Vitals Group     BP 12/02/19 1455 117/84     Pulse Rate 12/02/19 1455 (!) 103     Resp 12/02/19 1455 17     Temp 12/02/19 1455 100.1 F (37.8 C)     Temp  Source 12/02/19 1455 Oral     SpO2 12/02/19 1455 98 %     Weight 12/02/19 1452 150 lb (68 kg)     Height --      Head Circumference --      Peak Flow --      Pain Score 12/02/19 1451 9     Pain Loc --      Pain Edu? --      Excl. in [place]? --    No data found.  Updated Vital Signs BP 117/84 (BP Location: Left Arm)   Pulse (!) 103   Temp 100.1 F (37.8 C) (Oral)   Resp 17   Wt 150 lb (68 kg)   SpO2 98%   BMI 27.44 kg/m   Visual Acuity Right Eye Distance:   Left Eye Distance:   Bilateral Distance:    Right Eye Near:   Left Eye Near:    Bilateral Near:     Physical Exam Vitals and nursing note reviewed.  Constitutional:      General: She is not in acute distress.    Appearance: She is ill-appearing. She is not toxic-appearing or diaphoretic.  HENT:     Head: Normocephalic.     Right Ear: Ear canal and external ear normal.     Left Ear: Ear canal and external ear normal.     Ears:     Comments: Both TM's dull and gray    Nose: Congestion present. No rhinorrhea.     Comments: Has sinus tenderness    Mouth/Throat:     Mouth: Mucous membranes are moist.  Eyes:     General: No scleral icterus.    Conjunctiva/sclera: Conjunctivae normal.  Cardiovascular:     Rate and Rhythm: Tachycardia present.     Heart sounds: No murmur.  Pulmonary:     Effort: Pulmonary effort is normal.     Breath sounds: Normal breath sounds. No wheezing or rhonchi.  Abdominal:     General: Bowel sounds are normal. There is no distension.     Palpations: Abdomen is soft. There is no mass.     Tenderness: There is no abdominal tenderness. There is no guarding or rebound.     Hernia: No hernia is present.  Musculoskeletal:        General: Normal range of motion.     Cervical back: Neck supple. No rigidity.  Lymphadenopathy:     Cervical: No cervical adenopathy.  Skin:    General: Skin is warm  and dry.     Findings: No rash.  Neurological:     Mental Status: She is alert and oriented to  person, place, and time.     Gait: Gait normal.  Psychiatric:        Mood and Affect: Mood normal.        Behavior: Behavior normal.        Thought Content: Thought content normal.        Judgment: Judgment normal.    UC Treatments / Results  Labs (all labs ordered are listed, but only abnormal results are displayed) Labs Reviewed - No data to display  EKG   Radiology DG Chest 2 View  Result Date: 12/02/2019 CLINICAL DATA:  63 year old female positive COVID-19.  Fever. EXAM: CHEST - 2 VIEW COMPARISON:  Chest radiographs 01/26/2011.  Chest CT 02/16/2011. FINDINGS: Lung volumes and mediastinal contours have not significantly changed since 2012. There is patchy new peripheral indistinct pulmonary opacity in the right upper and lower lung on the PA view, not correlated on the lateral. No other confluent opacity. No superimposed pneumothorax, pulmonary edema or pleural effusion. Cholecystectomy clips now in the right upper quadrant. Negative visible bowel gas pattern. No acute osseous abnormality identified. IMPRESSION: Positive for multifocal patchy and indistinct opacity in the right lung which is most suspicious for COVID-19 pneumonia in this clinical setting. Electronically Signed   By: Genevie Ann M.D.   On: 12/02/2019 15:19    Procedures Procedures (including critical care time)  Medications Ordered in UC Medications - No data to display  Initial Impression / Assessment and Plan / UC Course  I have reviewed the triage vital signs and the nursing notes. Pertinent  imaging results that were available during my care of the patient were reviewed by me and considered in my medical decision making (see chart for details). I decided to treat her for bacterial sinusitis and pneumonia since the pneumonia is unilateral.  She was given Rocephin 1G IM and placed on Augmentin 875 mg bid x 10 days I also prescribed her albuterol inhaler for the cough attacks.  And Robitusin with codeine prn cough.      Final diagnoses:  None   Discharge Instructions   None    ED Prescriptions    None     PDMP not reviewed this encounter.   Shelby Mattocks, Hershal Coria 12/02/19 2029

## 2019-12-02 NOTE — Discharge Instructions (Addendum)
Your chest xray shows signs of covid pneumonia for which there is no treatment, but since you have a sinus infection which started this I decided to cover for bacterial pneumonia and bacterial sinusitis.   If you develop shortness of breath, please go to the ER. Continue with Zinc, Vit C and D. Add Melatonin.  Make yourself bone broth and drink this and other fluids to keep hydrated.  Try to move around and dont lay around too long, so you dont end up with a blood blot for prolonged laying. Call you family Dr for follow up.   You will need a repeated chest xray and 8 weeks to make sure it has resolved.

## 2020-02-19 ENCOUNTER — Ambulatory Visit
Admission: EM | Admit: 2020-02-19 | Discharge: 2020-02-19 | Disposition: A | Discharge status: Home / Self Care | Payer: Medicare Other | Attending: Family Medicine | Admitting: Family Medicine

## 2020-02-19 ENCOUNTER — Encounter: Payer: Self-pay | Admitting: Emergency Medicine

## 2020-02-19 ENCOUNTER — Other Ambulatory Visit: Payer: Self-pay

## 2020-02-19 DIAGNOSIS — M5431 Sciatica, right side: Secondary | ICD-10-CM | POA: Diagnosis not present

## 2020-02-19 HISTORY — DX: Anxiety disorder, unspecified: F41.9

## 2020-02-19 MED ORDER — PREDNISONE 10 MG PO TABS: ORAL_TABLET | ORAL | 0 refills | Status: DC

## 2020-02-19 MED ORDER — HYDROCODONE-ACETAMINOPHEN 5-325 MG PO TABS: ORAL_TABLET | ORAL | 0 refills | Status: DC

## 2020-02-19 MED ORDER — CYCLOBENZAPRINE HCL 10 MG PO TABS: 10.0000 mg | ORAL_TABLET | Freq: Every day | ORAL | 0 refills | Status: DC

## 2020-02-19 NOTE — ED Triage Notes (Signed)
Patient in today c/o right hip pain radiating down her right leg x 1 day. No injury noted. Patient has taken OTC 8 hour arthritis medicine. Patient also took Gabapentin and Meloxicam left over from 2018.

## 2020-02-19 NOTE — ED Provider Notes (Signed)
[facility]    CSN: [id number] Arrival date & time: 02/19/20  1306      History   Chief Complaint Chief Complaint  Patient presents with  . Hip Pain    right    HPI Katie Wise is a 64 y.o. female.   64 yo female with a c/o right low back, buttock and leg pain. States pain seems to come from the low back area and radiates down her leg. Denies any falls, injuries, rash, fevers, chills, unilateral weakness, saddle anesthesia, bowel or bladder problems.    Hip Pain    Past Medical History:  Diagnosis Date  . Abnormal CT scan, sinus    VASCULAR ABNORMALITY  . Anemia   . Anxiety   . Back pain   . Breast cyst 8+ years    HAD SURGICAL F/U WITH DR Pat Patrick   . Elevated ferritin level   . Hemochromatosis    Homozygous on genetic testing March 2002  . Hypertension     Patient Active Problem List   Diagnosis Date Noted  . Cerebrovascular accident, 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  . Abnormal brain scan 08/07/2014  . Cephalalgia 08/07/2014  . Cerebral vascular accident ([place]) 08/07/2014  . Cerebrovascular accident (CVA) ([place]) 08/07/2014  . Anxiety state 05/01/2014  . Hemochromatosis, hereditary ([place]) 05/01/2014  . BP (high blood pressure) 05/01/2014  . Ache in joint 05/01/2014  . Anxiety 05/01/2014    Past Surgical History:  Procedure Laterality Date  . BONE MARROW BIOPSY  01/26/2011   FLOW SHOWS POSITIVE FOR LGL'S, FISH IS NEG,  . BREAST EXCISIONAL BIOPSY Right 2011   Negative  . CHOLECYSTECTOMY    . COLONOSCOPY  07/2011  . ESOPHAGOGASTRODUODENOSCOPY  07/2011    OB History   No obstetric history on file.      Home Medications    Prior to Admission medications   Medication Sig Start Date End Date Taking? Authorizing Provider  ALPRAZolam Duanne Moron) 0.25 MG tablet Take 0.25 mg by mouth at bedtime as needed for anxiety. 0.5 tablet to 1 whole tablet every 12 hours as needed for anxiety   Yes [provider]  aspirin 81 MG chewable tablet Chew 81 mg by mouth  once.    Yes [provider]  Biotin 1 MG CAPS Take 1 capsule by mouth 1 day or 1 dose.   Yes [provider]  Cholecalciferol 1000 UNITS capsule Take 1,000 Units by mouth 2 (two) times daily.   Yes [provider]  levothyroxine (SYNTHROID, LEVOTHROID) 50 MCG tablet Take 1 tablet by mouth daily. 03/13/17  Yes [provider]  lisinopril (PRINIVIL,ZESTRIL) 20 MG tablet Take 20 mg by mouth daily.   Yes [provider]  triamcinolone (NASACORT) 55 MCG/ACT AERO nasal inhaler Place 1 spray into the nose as needed.    Yes [provider]  venlafaxine (EFFEXOR) 75 MG tablet Take 75 mg by mouth 2 (two) times daily with a meal.    Yes [provider]  albuterol (VENTOLIN HFA) 108 (90 Base) MCG/ACT inhaler Inhale 2 puffs into the lungs every 4 (four) hours as needed for up to 5 days for wheezing or shortness of breath (prn cough attacks). 12/02/19 12/07/19  Rodriguez-Southworth, Sunday Spillers, PA-C  amoxicillin-clavulanate (AUGMENTIN) 875-125 MG tablet Take 1 tablet by mouth every 12 (twelve) hours. 12/02/19   Rodriguez-Southworth, Sunday Spillers, PA-C  cyclobenzaprine (FLEXERIL) 10 MG tablet Take 1 tablet (10 mg total) by mouth at bedtime. 02/19/20   Norval Gable, MD  guaiFENesin-codeine 100-10  MG/5ML syrup Take 10 mLs by mouth at bedtime as needed for cough. 12/02/19   Rodriguez-Southworth, Sunday Spillers, PA-C  HYDROcodone-acetaminophen (NORCO/VICODIN) 5-325 MG tablet 1-2 tabs po qd prn 02/19/20   Norval Gable, MD  predniSONE (DELTASONE) 10 MG tablet Start 60 mg po day one, then 50 mg po day two, taper by 10 mg daily until complete. 02/19/20   Norval Gable, MD    Family History Family History  Problem Relation Age of Onset  . Ovarian cancer Mother        BRCA status reported negative  . Cancer Mother   . Heart disease Mother   . Cancer - Ovarian Mother   . Breast cancer Maternal Aunt 70  . Cancer Father   . Lung cancer Father     Social History Social  History   Tobacco Use  . Smoking status: Never Smoker  . Smokeless tobacco: Never Used  Substance Use Topics  . Alcohol use: Yes    Comment: social  . Drug use: No     Allergies   Amlodipine, Hydromorphone, Norvasc [amlodipine besylate], and Sulfa antibiotics   Review of Systems Review of Systems   Physical Exam Triage Vital Signs ED Triage Vitals  Enc Vitals Group     BP 02/19/20 1335 (!) 150/126     Pulse Rate 02/19/20 1335 80     Resp 02/19/20 1335 18     Temp 02/19/20 1335 98.1 F (36.7 C)     Temp Source 02/19/20 1335 Oral     SpO2 02/19/20 1335 100 %     Weight 02/19/20 1336 150 lb (68 kg)     Height 02/19/20 1336 '5\' 3"'  (1.6 m)     Head Circumference --      Peak Flow --      Pain Score 02/19/20 1335 10     Pain Loc --      Pain Edu? --      Excl. in [place]? --    No data found.  Updated Vital Signs BP (!) 150/126 (BP Location: Left Arm)   Pulse 80   Temp 98.1 F (36.7 C) (Oral)   Resp 18   Ht '5\' 3"'  (1.6 m)   Wt 68 kg   SpO2 100%   BMI 26.57 kg/m   Visual Acuity Right Eye Distance:   Left Eye Distance:   Bilateral Distance:    Right Eye Near:   Left Eye Near:    Bilateral Near:     Physical Exam Vitals and nursing note reviewed.  Constitutional:      General: She is not in acute distress.    Appearance: She is not toxic-appearing or diaphoretic.  Musculoskeletal:     Lumbar back: Spasms present. No swelling, edema, deformity, signs of trauma, lacerations or bony tenderness. Positive right straight leg raise test.     Comments: Tenderness over the right upper buttock muscle  Neurological:     Mental Status: She is alert.      UC Treatments / Results  Labs (all labs ordered are listed, but only abnormal results are displayed) Labs Reviewed - No data to display  EKG   Radiology No results found.  Procedures Procedures (including critical care time)  Medications Ordered in UC Medications - No data to display  Initial  Impression / Assessment and Plan / UC Course  I have reviewed the triage vital signs and the nursing notes.  Pertinent labs & imaging results that were available during my care of  the patient were reviewed by me and considered in my medical decision making (see chart for details).      Final Clinical Impressions(s) / UC Diagnoses   Final diagnoses:  Sciatica of right side    ED Prescriptions    Medication Sig Dispense Auth. Provider   predniSONE (DELTASONE) 10 MG tablet Start 60 mg po day one, then 50 mg po day two, taper by 10 mg daily until complete. 21 tablet Norval Gable, MD   cyclobenzaprine (FLEXERIL) 10 MG tablet Take 1 tablet (10 mg total) by mouth at bedtime. 30 tablet Norval Gable, MD   HYDROcodone-acetaminophen (NORCO/VICODIN) 5-325 MG tablet 1-2 tabs po qd prn 6 tablet Norval Gable, MD      1. diagnosis reviewed with patient 2. rx as per orders above; reviewed possible side effects, interactions, risks and benefits; patient states she can take hydrocodone 3. Follow-up prn if symptoms worsen or don't improve  I have reviewed the PDMP during this encounter.   Norval Gable, MD 02/19/20 [phone]

## 2020-04-27 ENCOUNTER — Other Ambulatory Visit: Payer: Self-pay | Admitting: *Deleted

## 2020-04-27 ENCOUNTER — Other Ambulatory Visit: Payer: Self-pay

## 2020-04-27 ENCOUNTER — Encounter: Payer: Self-pay | Admitting: Internal Medicine

## 2020-04-27 ENCOUNTER — Inpatient Hospital Stay: Payer: Medicare Other | Attending: Internal Medicine

## 2020-04-27 LAB — COMPREHENSIVE METABOLIC PANEL
ALT: 25 U/L (ref 0–44)
AST: 24 U/L (ref 15–41)
Albumin: 4.3 g/dL (ref 3.5–5.0)
Alkaline Phosphatase: 57 U/L (ref 38–126)
Anion gap: 8 (ref 5–15)
BUN: 33 mg/dL — ABNORMAL HIGH (ref 8–23)
CO2: 27 mmol/L (ref 22–32)
Calcium: 9.4 mg/dL (ref 8.9–10.3)
Chloride: 103 mmol/L (ref 98–111)
Creatinine, Ser: 1.15 mg/dL — ABNORMAL HIGH (ref 0.44–1.00)
GFR calc Af Amer: 58 mL/min — ABNORMAL LOW (ref >60)
GFR calc non Af Amer: 50 mL/min — ABNORMAL LOW (ref >60)
Glucose, Bld: 93 mg/dL (ref 70–99)
Potassium: 4 mmol/L (ref 3.5–5.1)
Sodium: 138 mmol/L (ref 135–145)
Total Bilirubin: 0.8 mg/dL (ref 0.3–1.2)
Total Protein: 7.5 g/dL (ref 6.5–8.1)

## 2020-04-27 LAB — CBC WITH DIFFERENTIAL/PLATELET
Abs Immature Granulocytes: 0 10*3/uL (ref 0.00–0.07)
Basophils Absolute: 0 10*3/uL (ref 0.0–0.1)
Basophils Relative: 0 %
Eosinophils Absolute: 0.1 10*3/uL (ref 0.0–0.5)
Eosinophils Relative: 4 %
HCT: 34 % — ABNORMAL LOW (ref 36.0–46.0)
Hemoglobin: 11.8 g/dL — ABNORMAL LOW (ref 12.0–15.0)
Immature Granulocytes: 0 %
Lymphocytes Relative: 37 %
Lymphs Abs: 1.3 10*3/uL (ref 0.7–4.0)
MCH: 32.9 pg (ref 26.0–34.0)
MCHC: 34.7 g/dL (ref 30.0–36.0)
MCV: 94.7 fL (ref 80.0–100.0)
Monocytes Absolute: 0.5 10*3/uL (ref 0.1–1.0)
Monocytes Relative: 14 %
Neutro Abs: 1.6 10*3/uL — ABNORMAL LOW (ref 1.7–7.7)
Neutrophils Relative %: 45 %
Platelets: 256 10*3/uL (ref 150–400)
RBC: 3.59 MIL/uL — ABNORMAL LOW (ref 3.87–5.11)
RDW: 12.5 % (ref 11.5–15.5)
WBC: 3.4 10*3/uL — ABNORMAL LOW (ref 4.0–10.5)
nRBC: 0 % (ref 0.0–0.2)

## 2020-04-27 LAB — IRON AND TIBC
Iron: 169 ug/dL (ref 28–170)
Saturation Ratios: 53 % — ABNORMAL HIGH (ref 10.4–31.8)
TIBC: 316 ug/dL (ref 250–450)
UIBC: 147 ug/dL

## 2020-04-27 LAB — FERRITIN: Ferritin: 323 ng/mL — ABNORMAL HIGH (ref 11–307)

## 2020-04-28 ENCOUNTER — Inpatient Hospital Stay: Payer: Medicare Other

## 2020-04-28 ENCOUNTER — Other Ambulatory Visit: Payer: Self-pay | Admitting: *Deleted

## 2020-04-28 ENCOUNTER — Telehealth: Payer: Self-pay | Admitting: *Deleted

## 2020-04-28 ENCOUNTER — Inpatient Hospital Stay: Payer: Medicare Other | Admitting: Internal Medicine

## 2020-04-28 NOTE — Telephone Encounter (Signed)
Patient called asking if she is going to get a phlebotomy or not today since her Hgb is 11.2 and she does not want to drive all the way there to find out htat she does not need to come. Please return her call [phone]

## 2020-04-28 NOTE — Telephone Encounter (Signed)
Spoke with Dr. Rogue Bussing. Patient does not need to come. She may be r/s in 6 months for lab/md/possible phlebotomy in 6 months. Patient is aware that she does not need to come. Colette, please reschedule her appointments. She states that she will see her new apts on mychart.

## 2020-07-01 ENCOUNTER — Ambulatory Visit: Payer: Self-pay | Admitting: Dermatology

## 2020-10-26 ENCOUNTER — Other Ambulatory Visit: Payer: Self-pay

## 2020-10-26 ENCOUNTER — Inpatient Hospital Stay: Payer: Medicare Other | Attending: Internal Medicine

## 2020-10-26 DIAGNOSIS — N179 Acute kidney failure, unspecified: Secondary | ICD-10-CM | POA: Diagnosis not present

## 2020-10-26 DIAGNOSIS — N183 Chronic kidney disease, stage 3 unspecified: Secondary | ICD-10-CM | POA: Insufficient documentation

## 2020-10-26 DIAGNOSIS — D72819 Decreased white blood cell count, unspecified: Secondary | ICD-10-CM | POA: Diagnosis not present

## 2020-10-26 DIAGNOSIS — D649 Anemia, unspecified: Secondary | ICD-10-CM | POA: Insufficient documentation

## 2020-10-26 DIAGNOSIS — R300 Dysuria: Secondary | ICD-10-CM | POA: Diagnosis not present

## 2020-10-26 DIAGNOSIS — I129 Hypertensive chronic kidney disease with stage 1 through stage 4 chronic kidney disease, or unspecified chronic kidney disease: Secondary | ICD-10-CM | POA: Diagnosis not present

## 2020-10-26 LAB — COMPREHENSIVE METABOLIC PANEL
ALT: 21 U/L (ref 0–44)
AST: 23 U/L (ref 15–41)
Albumin: 4.4 g/dL (ref 3.5–5.0)
Alkaline Phosphatase: 50 U/L (ref 38–126)
Anion gap: 9 (ref 5–15)
BUN: 31 mg/dL — ABNORMAL HIGH (ref 8–23)
CO2: 25 mmol/L (ref 22–32)
Calcium: 9.4 mg/dL (ref 8.9–10.3)
Chloride: 104 mmol/L (ref 98–111)
Creatinine, Ser: 1.73 mg/dL — ABNORMAL HIGH (ref 0.44–1.00)
GFR, Estimated: 32 mL/min — ABNORMAL LOW (ref >60)
Glucose, Bld: 96 mg/dL (ref 70–99)
Potassium: 4.3 mmol/L (ref 3.5–5.1)
Sodium: 138 mmol/L (ref 135–145)
Total Bilirubin: 0.6 mg/dL (ref 0.3–1.2)
Total Protein: 7.8 g/dL (ref 6.5–8.1)

## 2020-10-26 LAB — CBC WITH DIFFERENTIAL/PLATELET
Abs Immature Granulocytes: 0 10*3/uL (ref 0.00–0.07)
Basophils Absolute: 0 10*3/uL (ref 0.0–0.1)
Basophils Relative: 0 %
Eosinophils Absolute: 0 10*3/uL (ref 0.0–0.5)
Eosinophils Relative: 0 %
HCT: 28.4 % — ABNORMAL LOW (ref 36.0–46.0)
Hemoglobin: 9.7 g/dL — ABNORMAL LOW (ref 12.0–15.0)
Immature Granulocytes: 0 %
Lymphocytes Relative: 31 %
Lymphs Abs: 1 10*3/uL (ref 0.7–4.0)
MCH: 31.5 pg (ref 26.0–34.0)
MCHC: 34.2 g/dL (ref 30.0–36.0)
MCV: 92.2 fL (ref 80.0–100.0)
Monocytes Absolute: 0.4 10*3/uL (ref 0.1–1.0)
Monocytes Relative: 12 %
Neutro Abs: 2 10*3/uL (ref 1.7–7.7)
Neutrophils Relative %: 57 %
Platelets: 246 10*3/uL (ref 150–400)
RBC: 3.08 MIL/uL — ABNORMAL LOW (ref 3.87–5.11)
RDW: 12.8 % (ref 11.5–15.5)
WBC: 3.4 10*3/uL — ABNORMAL LOW (ref 4.0–10.5)
nRBC: 0 % (ref 0.0–0.2)

## 2020-10-26 LAB — FERRITIN: Ferritin: 473 ng/mL — ABNORMAL HIGH (ref 11–307)

## 2020-10-26 LAB — IRON AND TIBC
Iron: 136 ug/dL (ref 28–170)
Saturation Ratios: 43 % — ABNORMAL HIGH (ref 10.4–31.8)
TIBC: 318 ug/dL (ref 250–450)
UIBC: 182 ug/dL

## 2020-10-27 ENCOUNTER — Ambulatory Visit
Admission: RE | Admit: 2020-10-27 | Discharge: 2020-10-27 | Disposition: A | Discharge status: Home / Self Care | Payer: Medicare Other | Source: Ambulatory Visit | Attending: Internal Medicine | Admitting: Internal Medicine

## 2020-10-27 ENCOUNTER — Other Ambulatory Visit: Payer: Self-pay

## 2020-10-27 ENCOUNTER — Telehealth: Payer: Self-pay | Admitting: Internal Medicine

## 2020-10-27 ENCOUNTER — Inpatient Hospital Stay: Payer: Medicare Other

## 2020-10-27 ENCOUNTER — Inpatient Hospital Stay: Payer: Medicare Other | Admitting: Internal Medicine

## 2020-10-27 VITALS — BP 121/80 | HR 92 | Temp 98.4°F | Wt 161.4 lb

## 2020-10-27 DIAGNOSIS — N179 Acute kidney failure, unspecified: Secondary | ICD-10-CM

## 2020-10-27 DIAGNOSIS — R300 Dysuria: Secondary | ICD-10-CM

## 2020-10-27 LAB — URINALYSIS, COMPLETE (UACMP) WITH MICROSCOPIC
Bacteria, UA: NONE SEEN
Bilirubin Urine: NEGATIVE
Glucose, UA: NEGATIVE mg/dL
Hgb urine dipstick: NEGATIVE
Ketones, ur: NEGATIVE mg/dL
Leukocytes,Ua: NEGATIVE
Nitrite: NEGATIVE
Protein, ur: NEGATIVE mg/dL
Specific Gravity, Urine: 1.005 (ref 1.005–1.030)
Squamous Epithelial / HPF: NONE SEEN (ref 0–5)
pH: 5 (ref 5.0–8.0)

## 2020-10-27 NOTE — Telephone Encounter (Signed)
On 11/16-spoke to patient regarding results of the renal ultrasound-no acute process noted.  Continue holding of NSAIDs; continue hydration; hold lisinopril.  Recommend follow-up with PCP as planned on 29th.   Otherwise follow-up as planned in 6 months.  GB

## 2020-10-27 NOTE — Patient Instructions (Signed)
#   STOP LISINOPRIL # STOP ADVIL/MOTRIN/ALEEVE; OK to take tylenol for pain as needed # will get Kidney ultrasound

## 2020-10-27 NOTE — Assessment & Plan Note (Addendum)
#   Hereditary hemochromatosis- homozygous [as per patient]-without organ dysfunction; goal ferritin less than 100/saturation less than 50.  STABLE.  Today ferritin is 473 saturation 46%; HOLD phlebotomy today as hemoglobin slightly low at  9.5 [see below]  # Anemia- Hb 9.5- ? AKI-see below; no evidence of any iron deficiency.  #AKI on CKD stage III- GFR-32 today- worse; ? Etiology. [2 ibupofen/ gabapentin]; STOP lisinopril.    # Mild nonspecific leukopenia 3.4-STABLE.   # DISPOSITION: will call with results # HOLD Phlebotomy today; UA/culture today # STAT Kidney US  # follow up in 6 months- MD; possible phlebotomy; 2 days prior- cbc/cmp/iron studies/ferritin- Dr.B  Addendum: On 11/16-spoke to patient regarding results of the renal ultrasound-no acute process noted.  Continue holding of NSAIDs; continue hydration; hold lisinopril.  Recommend follow-up with PCP as planned on 29th.Otherwise follow-up as planned in 6 months.   Cc; Dr.Bronstein

## 2020-10-27 NOTE — Progress Notes (Signed)
[facility] OFFICE PROGRESS NOTE  Patient Care Team: [name], MD as PCP - General (Family Medicine) Inez Pilgrim Simonne Come, MD (Internal Medicine) Christene Lye, MD (General Surgery)   SUMMARY OF HEMATOLOGIC-ONCOLOGIC HISTORY:  # 2002- HEREDITARY HEMOCHROMATOSIS HOMOZYGOUS [goal ferritin 100-150]    # 2012- BMbx [sec to Anemia]-Normo-cellular; T Large Granular Lymphocytes by flowcytometry [4.5%] primary vs Reactive; FISH MDS- NEG  # Hx of Stroke [2012; no deficits]; CKD stage III     INTERVAL HISTORY:  64 year old female patient with a prior history of homozygous hereditary hemochromatosis is here for follow-up.  She noted to have musculoskeletal pain in the back for which she has been taking ibuprofen and gabapentin.  She has been taking up to 2 ibuprofens a day.  She denies any blood in stools or black or stools no nausea vomiting.   Review of Systems  Constitutional: Positive for malaise/fatigue. Negative for chills, diaphoresis, fever and weight loss.  HENT: Negative for nosebleeds and sore throat.   Eyes: Negative for double vision.  Respiratory: Negative for cough, hemoptysis, sputum production, shortness of breath and wheezing.   Cardiovascular: Negative for chest pain, palpitations, orthopnea and leg swelling.  Gastrointestinal: Negative for abdominal pain, blood in stool, constipation, diarrhea, heartburn, melena, nausea and vomiting.  Genitourinary: Negative for dysuria, frequency and urgency.  Musculoskeletal: Positive for back pain. Negative for joint pain.  Skin: Negative.  Negative for itching and rash.  Neurological: Negative for dizziness, tingling, focal weakness, weakness and headaches.  Endo/Heme/Allergies: Does not bruise/bleed easily.  Psychiatric/Behavioral: Negative for depression. The patient is not nervous/anxious and does not have insomnia.       PAST MEDICAL HISTORY :  Past Medical History:  Diagnosis Date  . Abnormal  CT scan, sinus    VASCULAR ABNORMALITY  . Anemia   . Anxiety   . Back pain   . Breast cyst 8+ years    HAD SURGICAL F/U WITH DR Pat Patrick   . Elevated ferritin level   . Hemochromatosis    Homozygous on genetic testing March 2002  . Hypertension     PAST SURGICAL HISTORY :   Past Surgical History:  Procedure Laterality Date  . BONE MARROW BIOPSY  01/26/2011   FLOW SHOWS POSITIVE FOR LGL'S, FISH IS NEG,  . BREAST EXCISIONAL BIOPSY Right 2011   Negative  . CHOLECYSTECTOMY    . COLONOSCOPY  07/2011  . ESOPHAGOGASTRODUODENOSCOPY  07/2011    FAMILY HISTORY :   Family History  Problem Relation Age of Onset  . Ovarian cancer Mother        BRCA status reported negative  . Cancer Mother   . Heart disease Mother   . Cancer - Ovarian Mother   . Breast cancer Maternal Aunt 70  . Cancer Father   . Lung cancer Father     SOCIAL HISTORY:   Social History   Tobacco Use  . Smoking status: Never Smoker  . Smokeless tobacco: Never Used  Vaping Use  . Vaping Use: Never used  Substance Use Topics  . Alcohol use: Yes    Comment: social  . Drug use: No    ALLERGIES:  is allergic to amlodipine, hydromorphone, norvasc [amlodipine besylate], and sulfa antibiotics.  MEDICATIONS:  Current Outpatient Medications  Medication Sig Dispense Refill  . ALPRAZolam (XANAX) 0.25 MG tablet Take 0.25 mg by mouth at bedtime as needed for anxiety. 0.5 tablet to 1 whole tablet every 12 hours as needed for anxiety    .  aspirin 81 MG chewable tablet Chew 81 mg by mouth once.     . Biotin 1 MG CAPS Take 1 capsule by mouth 1 day or 1 dose.    . Cholecalciferol 1000 UNITS capsule Take 1,000 Units by mouth 2 (two) times daily.    Marland Kitchen gabapentin (NEURONTIN) 300 MG capsule Take by mouth.    . levothyroxine (SYNTHROID, LEVOTHROID) 50 MCG tablet Take 1 tablet by mouth daily.    Marland Kitchen lisinopril (PRINIVIL,ZESTRIL) 20 MG tablet Take 20 mg by mouth daily.    Marland Kitchen triamcinolone (NASACORT) 55 MCG/ACT AERO nasal inhaler Place 1  spray into the nose as needed.     . venlafaxine (EFFEXOR) 75 MG tablet Take 75 mg by mouth 2 (two) times daily with a meal.      No current facility-administered medications for this visit.    PHYSICAL EXAMINATION: ECOG PERFORMANCE STATUS: 0 - Asymptomatic  BP 121/80 (BP Location: Right Arm, Patient Position: Sitting)   Pulse 92   Temp 98.4 F (36.9 C)   Wt 161 lb 6.4 oz (73.2 kg)   SpO2 100%   BMI 28.59 kg/m   Filed Weights   10/27/20 1337  Weight: 161 lb 6.4 oz (73.2 kg)    Physical Exam HENT:     Head: Normocephalic and atraumatic.     Mouth/Throat:     Pharynx: No oropharyngeal exudate.  Eyes:     Pupils: Pupils are equal, round, and reactive to light.  Cardiovascular:     Rate and Rhythm: Normal rate and regular rhythm.  Pulmonary:     Effort: No respiratory distress.     Breath sounds: No wheezing.  Abdominal:     General: Bowel sounds are normal. There is no distension.     Palpations: Abdomen is soft. There is no mass.     Tenderness: There is no abdominal tenderness. There is no guarding or rebound.  Musculoskeletal:        General: No tenderness. Normal range of motion.     Cervical back: Normal range of motion and neck supple.  Skin:    General: Skin is warm.  Neurological:     Mental Status: She is alert and oriented to person, place, and time.  Psychiatric:        Mood and Affect: Affect normal.      LABORATORY DATA:  I have reviewed the data as listed    Component Value Date/Time   NA 138 10/26/2020 1106   NA 141 04/13/2018 1517   NA 142 05/19/2012 1957   K 4.3 10/26/2020 1106   K 4.6 05/19/2012 1957   CL 104 10/26/2020 1106   CL 108 (H) 05/19/2012 1957   CO2 25 10/26/2020 1106   CO2 26 05/19/2012 1957   GLUCOSE 96 10/26/2020 1106   GLUCOSE 94 05/19/2012 1957   BUN 31 (H) 10/26/2020 1106   BUN 34 (H) 04/13/2018 1517   BUN 10 05/19/2012 1957   CREATININE 1.73 (H) 10/26/2020 1106   CREATININE 1.09 05/19/2012 1957   CALCIUM 9.4  10/26/2020 1106   CALCIUM 9.0 05/19/2012 1957   PROT 7.8 10/26/2020 1106   PROT 7.3 04/13/2018 1517   PROT 6.2 (L) 05/19/2012 1957   ALBUMIN 4.4 10/26/2020 1106   ALBUMIN 4.9 (H) 04/13/2018 1517   ALBUMIN 3.4 05/19/2012 1957   AST 23 10/26/2020 1106   AST 31 05/19/2012 1957   ALT 21 10/26/2020 1106   ALT 28 05/19/2012 1957   ALKPHOS 50 10/26/2020 1106  ALKPHOS 61 05/19/2012 1957   BILITOT 0.6 10/26/2020 1106   BILITOT 0.3 04/13/2018 1517   BILITOT 0.3 05/19/2012 1957   GFRNONAA 32 (L) 10/26/2020 1106   GFRNONAA 56 (L) 05/19/2012 1957   GFRAA 58 (L) 04/27/2020 1035   GFRAA >60 05/19/2012 1957    No results found for: SPEP, UPEP  Lab Results  Component Value Date   WBC 3.4 (L) 10/26/2020   NEUTROABS 2.0 10/26/2020   HGB 9.7 (L) 10/26/2020   HCT 28.4 (L) 10/26/2020   MCV 92.2 10/26/2020   PLT 246 10/26/2020      Chemistry      Component Value Date/Time   NA 138 10/26/2020 1106   NA 141 04/13/2018 1517   NA 142 05/19/2012 1957   K 4.3 10/26/2020 1106   K 4.6 05/19/2012 1957   CL 104 10/26/2020 1106   CL 108 (H) 05/19/2012 1957   CO2 25 10/26/2020 1106   CO2 26 05/19/2012 1957   BUN 31 (H) 10/26/2020 1106   BUN 34 (H) 04/13/2018 1517   BUN 10 05/19/2012 1957   CREATININE 1.73 (H) 10/26/2020 1106   CREATININE 1.09 05/19/2012 1957      Component Value Date/Time   CALCIUM 9.4 10/26/2020 1106   CALCIUM 9.0 05/19/2012 1957   ALKPHOS 50 10/26/2020 1106   ALKPHOS 61 05/19/2012 1957   AST 23 10/26/2020 1106   AST 31 05/19/2012 1957   ALT 21 10/26/2020 1106   ALT 28 05/19/2012 1957   BILITOT 0.6 10/26/2020 1106   BILITOT 0.3 04/13/2018 1517   BILITOT 0.3 05/19/2012 1957        ASSESSMENT & PLAN:   Hemochromatosis, hereditary ([place]) # Hereditary hemochromatosis- homozygous [as per patient]-without organ dysfunction; goal ferritin less than 100/saturation less than 50.  STABLE.  Today ferritin is 473 saturation 46%; HOLD phlebotomy today as hemoglobin  slightly low at  9.5 [see below]  # Anemia- Hb 9.5- ? AKI-see below; no evidence of any iron deficiency.  #AKI on CKD stage III- GFR-32 today- worse; ? Etiology. [2 ibupofen/ gabapentin]; STOP lisinopril.    # Mild nonspecific leukopenia 3.4-STABLE.   # DISPOSITION: will call with results # HOLD Phlebotomy today; UA/culture today # STAT Kidney US  # follow up in 6 months- MD; possible phlebotomy; 2 days prior- cbc/cmp/iron studies/ferritin- Dr.B  Addendum: On 11/16-spoke to patient regarding results of the renal ultrasound-no acute process noted.  Continue holding of NSAIDs; continue hydration; hold lisinopril.  Recommend follow-up with PCP as planned on 29th.Otherwise follow-up as planned in 6 months.   Cc; Dr.Bronstein    Cammie Sickle, MD 10/27/2020 8:02 PM

## 2020-10-29 LAB — URINE CULTURE: Culture: NO GROWTH

## 2020-11-25 ENCOUNTER — Other Ambulatory Visit: Payer: Self-pay | Admitting: Obstetrics and Gynecology

## 2020-11-25 DIAGNOSIS — Z1231 Encounter for screening mammogram for malignant neoplasm of breast: Secondary | ICD-10-CM

## 2020-12-25 ENCOUNTER — Ambulatory Visit
Admission: RE | Admit: 2020-12-25 | Discharge: 2020-12-25 | Disposition: A | Discharge status: Home / Self Care | Payer: Medicare Other | Source: Ambulatory Visit | Attending: Obstetrics and Gynecology | Admitting: Obstetrics and Gynecology

## 2020-12-25 ENCOUNTER — Other Ambulatory Visit: Payer: Self-pay

## 2020-12-25 DIAGNOSIS — Z1231 Encounter for screening mammogram for malignant neoplasm of breast: Secondary | ICD-10-CM | POA: Diagnosis not present

## 2021-03-19 ENCOUNTER — Encounter: Payer: Self-pay | Admitting: Internal Medicine

## 2021-03-22 ENCOUNTER — Telehealth: Payer: Self-pay | Admitting: *Deleted

## 2021-03-22 NOTE — Telephone Encounter (Signed)
Incoming call from patient's daughter, who is returning clinic's phone call on behalf of the patient. Patient currently not able to return call due to her husband having a stroke. Pt's husband is currently in the hospital. Pt has limited reception  Daughter given apt time/date with Dr. Rogue Bussing. She will give the apt to her mother.

## 2021-03-22 NOTE — Telephone Encounter (Signed)
hgb 8.8. patient needs medical clearance for low hgb prior to ovarian cysts removal.  I called the patient and left a detailed vm - requesting a call back to arrange for an apt next week with Dr. Rogue Bussing.

## 2021-03-22 NOTE — Telephone Encounter (Signed)
-----   Message from Secundino Ginger sent at 03/19/2021 12:47 PM EDT ----- Regarding: PRO[facility] PRO[facility] NOTES [facility]/ Dr Ouida Sills suggests she see her hematologist before her surgery. Note sent to chart.

## 2021-03-29 ENCOUNTER — Inpatient Hospital Stay: Payer: Medicare Other

## 2021-03-29 ENCOUNTER — Inpatient Hospital Stay: Payer: Medicare Other | Attending: Internal Medicine | Admitting: Internal Medicine

## 2021-03-29 ENCOUNTER — Other Ambulatory Visit: Payer: Self-pay

## 2021-03-29 VITALS — BP 130/84 | HR 82 | Temp 97.2°F | Resp 20 | Ht 62.0 in | Wt 160.0 lb

## 2021-03-29 DIAGNOSIS — N179 Acute kidney failure, unspecified: Secondary | ICD-10-CM | POA: Diagnosis not present

## 2021-03-29 DIAGNOSIS — I129 Hypertensive chronic kidney disease with stage 1 through stage 4 chronic kidney disease, or unspecified chronic kidney disease: Secondary | ICD-10-CM | POA: Insufficient documentation

## 2021-03-29 DIAGNOSIS — D649 Anemia, unspecified: Secondary | ICD-10-CM

## 2021-03-29 DIAGNOSIS — N183 Chronic kidney disease, stage 3 unspecified: Secondary | ICD-10-CM | POA: Insufficient documentation

## 2021-03-29 LAB — IRON AND TIBC
Iron: 188 ug/dL — ABNORMAL HIGH (ref 28–170)
Saturation Ratios: 56 % — ABNORMAL HIGH (ref 10.4–31.8)
TIBC: 335 ug/dL (ref 250–450)
UIBC: 147 ug/dL

## 2021-03-29 LAB — CBC WITH DIFFERENTIAL/PLATELET
Abs Immature Granulocytes: 0.01 10*3/uL (ref 0.00–0.07)
Basophils Absolute: 0 10*3/uL (ref 0.0–0.1)
Basophils Relative: 0 %
Eosinophils Absolute: 0 10*3/uL (ref 0.0–0.5)
Eosinophils Relative: 0 %
HCT: 23.8 % — ABNORMAL LOW (ref 36.0–46.0)
Hemoglobin: 8 g/dL — ABNORMAL LOW (ref 12.0–15.0)
Immature Granulocytes: 0 %
Lymphocytes Relative: 35 %
Lymphs Abs: 1 10*3/uL (ref 0.7–4.0)
MCH: 31.7 pg (ref 26.0–34.0)
MCHC: 33.6 g/dL (ref 30.0–36.0)
MCV: 94.4 fL (ref 80.0–100.0)
Monocytes Absolute: 0.3 10*3/uL (ref 0.1–1.0)
Monocytes Relative: 9 %
Neutro Abs: 1.6 10*3/uL — ABNORMAL LOW (ref 1.7–7.7)
Neutrophils Relative %: 56 %
Platelets: 224 10*3/uL (ref 150–400)
RBC: 2.52 MIL/uL — ABNORMAL LOW (ref 3.87–5.11)
RDW: 13 % (ref 11.5–15.5)
WBC: 3 10*3/uL — ABNORMAL LOW (ref 4.0–10.5)
nRBC: 0 % (ref 0.0–0.2)

## 2021-03-29 LAB — COMPREHENSIVE METABOLIC PANEL
ALT: 22 U/L (ref 0–44)
AST: 28 U/L (ref 15–41)
Albumin: 4.4 g/dL (ref 3.5–5.0)
Alkaline Phosphatase: 46 U/L (ref 38–126)
Anion gap: 9 (ref 5–15)
BUN: 33 mg/dL — ABNORMAL HIGH (ref 8–23)
CO2: 23 mmol/L (ref 22–32)
Calcium: 10.1 mg/dL (ref 8.9–10.3)
Chloride: 105 mmol/L (ref 98–111)
Creatinine, Ser: 1.73 mg/dL — ABNORMAL HIGH (ref 0.44–1.00)
GFR, Estimated: 32 mL/min — ABNORMAL LOW (ref >60)
Glucose, Bld: 84 mg/dL (ref 70–99)
Potassium: 4.7 mmol/L (ref 3.5–5.1)
Sodium: 137 mmol/L (ref 135–145)
Total Bilirubin: 0.6 mg/dL (ref 0.3–1.2)
Total Protein: 7.5 g/dL (ref 6.5–8.1)

## 2021-03-29 LAB — RETICULOCYTES
Immature Retic Fract: 9.6 % (ref 2.3–15.9)
RBC.: 2.51 MIL/uL — ABNORMAL LOW (ref 3.87–5.11)
Retic Count, Absolute: 47.7 10*3/uL (ref 19.0–186.0)
Retic Ct Pct: 1.9 % (ref 0.4–3.1)

## 2021-03-29 LAB — TECHNOLOGIST SMEAR REVIEW
Plt Morphology: NORMAL
RBC Morphology: NORMAL
WBC Morphology: NORMAL

## 2021-03-29 LAB — LACTATE DEHYDROGENASE: LDH: 191 U/L (ref 98–192)

## 2021-03-29 LAB — FOLATE: Folate: 24 ng/mL (ref >5.9)

## 2021-03-29 LAB — VITAMIN B12: Vitamin B-12: 354 pg/mL (ref 180–914)

## 2021-03-29 LAB — FERRITIN: Ferritin: 525 ng/mL — ABNORMAL HIGH (ref 11–307)

## 2021-03-29 NOTE — Progress Notes (Signed)
Referral to Dr. Holley Raring faxed.

## 2021-03-29 NOTE — Progress Notes (Signed)
[facility] OFFICE PROGRESS NOTE  Patient Care Team: [name], MD as PCP - General (Family Medicine) [name], MD as Consulting Physician (Nephrology) Cammie Sickle, MD as Consulting Physician (Internal Medicine)   SUMMARY OF HEMATOLOGIC-ONCOLOGIC HISTORY:  # 2002- HEREDITARY HEMOCHROMATOSIS HOMOZYGOUS [goal ferritin 100-150]    # 2012- BMbx [sec to Anemia]-Normo-cellular; T Large Granular Lymphocytes by flowcytometry [4.5%] primary vs Reactive; FISH MDS- NEG  # Hx of Stroke [2012; no deficits]; CKD stage III     INTERVAL HISTORY:  65 year old female patient with a prior history of homozygous hereditary hemochromatosis is here for follow-up.  In the interim patient was diagnosed with complex ovarian cyst s/p evaluation Dr. Ouida Sills awaiting surgery.  However patient noted to have-worsening anemia with hemoglobin of 8.3.  She has been referred to hematology for further evaluation recommendations.  Patient continues to have intermittent back pain.  Otherwise denies any blood in stools or black-colored stools but denies any nausea vomiting.   Review of Systems  Constitutional: Positive for malaise/fatigue. Negative for chills, diaphoresis, fever and weight loss.  HENT: Negative for nosebleeds and sore throat.   Eyes: Negative for double vision.  Respiratory: Negative for cough, hemoptysis, sputum production, shortness of breath and wheezing.   Cardiovascular: Negative for chest pain, palpitations, orthopnea and leg swelling.  Gastrointestinal: Negative for abdominal pain, blood in stool, constipation, diarrhea, heartburn, melena, nausea and vomiting.  Genitourinary: Negative for dysuria, frequency and urgency.  Musculoskeletal: Positive for back pain. Negative for joint pain.  Skin: Negative.  Negative for itching and rash.  Neurological: Negative for dizziness, tingling, focal weakness, weakness and headaches.  Endo/Heme/Allergies: Does not  bruise/bleed easily.  Psychiatric/Behavioral: Negative for depression. The patient is not nervous/anxious and does not have insomnia.       PAST MEDICAL HISTORY :  Past Medical History:  Diagnosis Date  . Abnormal CT scan, sinus    VASCULAR ABNORMALITY  . Anemia   . Anxiety   . Back pain   . Breast cyst 8+ years    HAD SURGICAL F/U WITH DR Pat Patrick   . Elevated ferritin level   . Hemochromatosis    Homozygous on genetic testing March 2002  . Hypertension     PAST SURGICAL HISTORY :   Past Surgical History:  Procedure Laterality Date  . BONE MARROW BIOPSY  01/26/2011   FLOW SHOWS POSITIVE FOR LGL'S, FISH IS NEG,  . BREAST EXCISIONAL BIOPSY Right 2011   Negative  . CHOLECYSTECTOMY    . COLONOSCOPY  07/2011  . ESOPHAGOGASTRODUODENOSCOPY  07/2011    FAMILY HISTORY :   Family History  Problem Relation Age of Onset  . Ovarian cancer Mother        BRCA status reported negative  . Cancer Mother   . Heart disease Mother   . Cancer - Ovarian Mother   . Cancer Father   . Lung cancer Father   . Breast cancer Paternal Aunt     SOCIAL HISTORY:   Social History   Tobacco Use  . Smoking status: Never Smoker  . Smokeless tobacco: Never Used  Vaping Use  . Vaping Use: Never used  Substance Use Topics  . Alcohol use: Yes    Comment: social  . Drug use: No    ALLERGIES:  is allergic to amlodipine, hydromorphone, norvasc [amlodipine besylate], and sulfa antibiotics.  MEDICATIONS:  Current Outpatient Medications  Medication Sig Dispense Refill  . aspirin 81 MG chewable tablet Chew 81 mg by mouth once.     Marland Kitchen  Biotin 1 MG CAPS Take 1 capsule by mouth 1 day or 1 dose.    . cetirizine (ZYRTEC) 10 MG tablet Take 10 mg by mouth daily.    . Cholecalciferol 1000 UNITS capsule Take 1,000 Units by mouth 2 (two) times daily.    . [employer] FIBER GUMMIES PO Take 1 tablet by mouth daily.    Marland Kitchen gabapentin (NEURONTIN) 300 MG capsule Take 300 mg by mouth 2 (two) times daily.    Marland Kitchen levothyroxine  (SYNTHROID, LEVOTHROID) 50 MCG tablet Take 1 tablet by mouth daily.    Marland Kitchen lisinopril (PRINIVIL,ZESTRIL) 20 MG tablet Take 20 mg by mouth daily.    Marland Kitchen triamcinolone (NASACORT) 55 MCG/ACT AERO nasal inhaler Place 1 spray into the nose as needed.     . venlafaxine (EFFEXOR) 75 MG tablet Take 75 mg by mouth 2 (two) times daily with a meal.     . ALPRAZolam (XANAX) 0.25 MG tablet Take 0.25 mg by mouth at bedtime as needed for anxiety. 0.5 tablet to 1 whole tablet every 12 hours as needed for anxiety (Patient not taking: Reported on 03/29/2021)     No current facility-administered medications for this visit.    PHYSICAL EXAMINATION: ECOG PERFORMANCE STATUS: 0 - Asymptomatic  BP 130/84   Pulse 82   Temp (!) 97.2 F (36.2 C) (Tympanic)   Resp 20   Ht '5\' 2"'  (1.575 m)   Wt 160 lb (72.6 kg)   BMI 29.26 kg/m   Filed Weights   03/29/21 1428  Weight: 160 lb (72.6 kg)    Physical Exam HENT:     Head: Normocephalic and atraumatic.     Mouth/Throat:     Pharynx: No oropharyngeal exudate.  Eyes:     Pupils: Pupils are equal, round, and reactive to light.  Cardiovascular:     Rate and Rhythm: Normal rate and regular rhythm.  Pulmonary:     Effort: No respiratory distress.     Breath sounds: No wheezing.  Abdominal:     General: Bowel sounds are normal. There is no distension.     Palpations: Abdomen is soft. There is no mass.     Tenderness: There is no abdominal tenderness. There is no guarding or rebound.  Musculoskeletal:        General: No tenderness. Normal range of motion.     Cervical back: Normal range of motion and neck supple.  Skin:    General: Skin is warm.  Neurological:     Mental Status: She is alert and oriented to person, place, and time.  Psychiatric:        Mood and Affect: Affect normal.      LABORATORY DATA:  I have reviewed the data as listed    Component Value Date/Time   NA 137 03/29/2021 1501   NA 141 04/13/2018 1517   NA 142 05/19/2012 1957   K 4.7  03/29/2021 1501   K 4.6 05/19/2012 1957   CL 105 03/29/2021 1501   CL 108 (H) 05/19/2012 1957   CO2 23 03/29/2021 1501   CO2 26 05/19/2012 1957   GLUCOSE 84 03/29/2021 1501   GLUCOSE 94 05/19/2012 1957   BUN 33 (H) 03/29/2021 1501   BUN 34 (H) 04/13/2018 1517   BUN 10 05/19/2012 1957   CREATININE 1.73 (H) 03/29/2021 1501   CREATININE 1.09 05/19/2012 1957   CALCIUM 10.1 03/29/2021 1501   CALCIUM 9.0 05/19/2012 1957   PROT 7.5 03/29/2021 1501   PROT 7.3 04/13/2018 1517  PROT 6.2 (L) 05/19/2012 1957   ALBUMIN 4.4 03/29/2021 1501   ALBUMIN 4.9 (H) 04/13/2018 1517   ALBUMIN 3.4 05/19/2012 1957   AST 28 03/29/2021 1501   AST 31 05/19/2012 1957   ALT 22 03/29/2021 1501   ALT 28 05/19/2012 1957   ALKPHOS 46 03/29/2021 1501   ALKPHOS 61 05/19/2012 1957   BILITOT 0.6 03/29/2021 1501   BILITOT 0.3 04/13/2018 1517   BILITOT 0.3 05/19/2012 1957   GFRNONAA 32 (L) 03/29/2021 1501   GFRNONAA 56 (L) 05/19/2012 1957   GFRAA 58 (L) 04/27/2020 1035   GFRAA >60 05/19/2012 1957    No results found for: SPEP, UPEP  Lab Results  Component Value Date   WBC 3.0 (L) 03/29/2021   NEUTROABS 1.6 (L) 03/29/2021   HGB 8.0 (L) 03/29/2021   HCT 23.8 (L) 03/29/2021   MCV 94.4 03/29/2021   PLT 224 03/29/2021      Chemistry      Component Value Date/Time   NA 137 03/29/2021 1501   NA 141 04/13/2018 1517   NA 142 05/19/2012 1957   K 4.7 03/29/2021 1501   K 4.6 05/19/2012 1957   CL 105 03/29/2021 1501   CL 108 (H) 05/19/2012 1957   CO2 23 03/29/2021 1501   CO2 26 05/19/2012 1957   BUN 33 (H) 03/29/2021 1501   BUN 34 (H) 04/13/2018 1517   BUN 10 05/19/2012 1957   CREATININE 1.73 (H) 03/29/2021 1501   CREATININE 1.09 05/19/2012 1957      Component Value Date/Time   CALCIUM 10.1 03/29/2021 1501   CALCIUM 9.0 05/19/2012 1957   ALKPHOS 46 03/29/2021 1501   ALKPHOS 61 05/19/2012 1957   AST 28 03/29/2021 1501   AST 31 05/19/2012 1957   ALT 22 03/29/2021 1501   ALT 28 05/19/2012 1957    BILITOT 0.6 03/29/2021 1501   BILITOT 0.3 04/13/2018 1517   BILITOT 0.3 05/19/2012 1957        ASSESSMENT & PLAN:   Normocytic anemia # Recent worsening anemia-hemoglobin 8.3 [? Etiology; ? Renal insuffiencey vs. Others].  Check CBC CMP LDH iron studies ferritin folic acid; monoclonal work-up kappa lambda light chain ratio.  Review of peripheral smear.  #CKD-GFR in 30s creatinine 1.7 etiology unclear; October 2021 ultrasound kidneys negative; UA negative. ? NSIADs vs others.  We will make a referral to nephrology for urgent evaluation.  # Hereditary hemochromatosis- homozygous [as per patient]-without organ dysfunction; goal ferritin less than 100/saturation less than 50.  Stable.  #Complex ovarian cyst [Dr. Schermerhorn]-awaiting surgery after evaluation/resolution of above anemia.  Discussed with Dr. Anders Grant above work-up prior to proceeding with surgery.  # DISPOSITION: will call with results # referral to nephrology- urgent re: renal failure.  # labs today- ordered.  # follow up TBD-Dr.B  Addendum: on 4/18-  I left a voicemail for the patient that hemoglobin is 8; renal function low but stable at 1.7 creatinine.  Await rest of the work-up.    Cc; Dr.Bronstein    Cammie Sickle, MD 03/29/2021 8:12 PM

## 2021-03-29 NOTE — Assessment & Plan Note (Addendum)
#   Recent worsening anemia-hemoglobin 8.3 [? Etiology; ? Renal insuffiencey vs. Others].  Check CBC CMP LDH iron studies ferritin folic acid; monoclonal work-up kappa lambda light chain ratio.  Review of peripheral smear.  #CKD-GFR in 30s creatinine 1.7 etiology unclear; October 2021 ultrasound kidneys negative; UA negative. ? NSIADs vs others.  We will make a referral to nephrology for urgent evaluation.  # Hereditary hemochromatosis- homozygous [as per patient]-without organ dysfunction; goal ferritin less than 100/saturation less than 50.  Stable.  #Complex ovarian cyst [Dr. Schermerhorn]-awaiting surgery after evaluation/resolution of above anemia.  Discussed with Dr. Anders Grant above work-up prior to proceeding with surgery.  # DISPOSITION: will call with results # referral to nephrology- urgent re: renal failure.  # labs today- ordered.  # follow up TBD-Dr.B  Addendum: on 4/18-  I left a voicemail for the patient that hemoglobin is 8; renal function low but stable at 1.7 creatinine.  Await rest of the work-up.    Cc; Dr.Bronstein

## 2021-03-30 LAB — HAPTOGLOBIN: Haptoglobin: 105 mg/dL (ref 37–355)

## 2021-03-30 LAB — KAPPA/LAMBDA LIGHT CHAINS
Kappa free light chain: 28.3 mg/L — ABNORMAL HIGH (ref 3.3–19.4)
Kappa, lambda light chain ratio: 1.36 (ref 0.26–1.65)
Lambda free light chains: 20.8 mg/L (ref 5.7–26.3)

## 2021-03-31 DIAGNOSIS — N183 Chronic kidney disease, stage 3 unspecified: Secondary | ICD-10-CM | POA: Insufficient documentation

## 2021-03-31 LAB — MULTIPLE MYELOMA PANEL, SERUM
Albumin SerPl Elph-Mcnc: 4.3 g/dL (ref 2.9–4.4)
Albumin/Glob SerPl: 1.5 (ref 0.7–1.7)
Alpha 1: 0.3 g/dL (ref 0.0–0.4)
Alpha2 Glob SerPl Elph-Mcnc: 0.6 g/dL (ref 0.4–1.0)
B-Globulin SerPl Elph-Mcnc: 0.9 g/dL (ref 0.7–1.3)
Gamma Glob SerPl Elph-Mcnc: 1.1 g/dL (ref 0.4–1.8)
Globulin, Total: 2.9 g/dL (ref 2.2–3.9)
IgA: 75 mg/dL — ABNORMAL LOW (ref 87–352)
IgG (Immunoglobin G), Serum: 1082 mg/dL (ref 586–1602)
IgM (Immunoglobulin M), Srm: 59 mg/dL (ref 26–217)
Total Protein ELP: 7.2 g/dL (ref 6.0–8.5)

## 2021-04-01 ENCOUNTER — Other Ambulatory Visit: Payer: Self-pay | Admitting: Nephrology

## 2021-04-01 DIAGNOSIS — N1832 Chronic kidney disease, stage 3b: Secondary | ICD-10-CM

## 2021-04-16 ENCOUNTER — Encounter: Payer: Self-pay | Admitting: Nephrology

## 2021-04-17 ENCOUNTER — Telehealth: Payer: Self-pay | Admitting: Internal Medicine

## 2021-04-17 NOTE — Telephone Encounter (Signed)
On 5/06- Left voice mail re: her work up with nephrology re: CKD. MM work up- April 2022- NEGATIVE. And informed Dr.Lateef.   Suspect that pt's anemia from CKD disease rather from any primary bone marrow process.   Pt awaiting clearance from hematology prior to her ovarian cyst surgery.   GB

## 2021-04-26 ENCOUNTER — Other Ambulatory Visit: Payer: Medicare Other

## 2021-04-28 ENCOUNTER — Ambulatory Visit: Payer: Medicare Other | Admitting: Internal Medicine

## 2021-05-11 ENCOUNTER — Inpatient Hospital Stay: Payer: Medicare Other | Attending: Internal Medicine

## 2021-05-11 ENCOUNTER — Other Ambulatory Visit: Payer: Self-pay

## 2021-05-11 DIAGNOSIS — D649 Anemia, unspecified: Secondary | ICD-10-CM

## 2021-05-11 LAB — CBC WITH DIFFERENTIAL/PLATELET
Abs Immature Granulocytes: 0.01 10*3/uL (ref 0.00–0.07)
Basophils Absolute: 0 10*3/uL (ref 0.0–0.1)
Basophils Relative: 0 %
Eosinophils Absolute: 0 10*3/uL (ref 0.0–0.5)
Eosinophils Relative: 0 %
HCT: 26.2 % — ABNORMAL LOW (ref 36.0–46.0)
Hemoglobin: 9 g/dL — ABNORMAL LOW (ref 12.0–15.0)
Immature Granulocytes: 0 %
Lymphocytes Relative: 39 %
Lymphs Abs: 1.5 10*3/uL (ref 0.7–4.0)
MCH: 32.3 pg (ref 26.0–34.0)
MCHC: 34.4 g/dL (ref 30.0–36.0)
MCV: 93.9 fL (ref 80.0–100.0)
Monocytes Absolute: 0.5 10*3/uL (ref 0.1–1.0)
Monocytes Relative: 14 %
Neutro Abs: 1.8 10*3/uL (ref 1.7–7.7)
Neutrophils Relative %: 47 %
Platelets: 218 10*3/uL (ref 150–400)
RBC: 2.79 MIL/uL — ABNORMAL LOW (ref 3.87–5.11)
RDW: 12.8 % (ref 11.5–15.5)
WBC: 3.8 10*3/uL — ABNORMAL LOW (ref 4.0–10.5)
nRBC: 0 % (ref 0.0–0.2)

## 2021-05-11 LAB — COMPREHENSIVE METABOLIC PANEL
ALT: 20 U/L (ref 0–44)
AST: 23 U/L (ref 15–41)
Albumin: 4.4 g/dL (ref 3.5–5.0)
Alkaline Phosphatase: 57 U/L (ref 38–126)
Anion gap: 10 (ref 5–15)
BUN: 41 mg/dL — ABNORMAL HIGH (ref 8–23)
CO2: 21 mmol/L — ABNORMAL LOW (ref 22–32)
Calcium: 8.9 mg/dL (ref 8.9–10.3)
Chloride: 101 mmol/L (ref 98–111)
Creatinine, Ser: 1.75 mg/dL — ABNORMAL HIGH (ref 0.44–1.00)
GFR, Estimated: 32 mL/min — ABNORMAL LOW (ref >60)
Glucose, Bld: 89 mg/dL (ref 70–99)
Potassium: 4.8 mmol/L (ref 3.5–5.1)
Sodium: 132 mmol/L — ABNORMAL LOW (ref 135–145)
Total Bilirubin: 0.5 mg/dL (ref 0.3–1.2)
Total Protein: 7.4 g/dL (ref 6.5–8.1)

## 2021-05-12 ENCOUNTER — Inpatient Hospital Stay: Payer: Medicare Other

## 2021-05-12 ENCOUNTER — Inpatient Hospital Stay: Payer: Medicare Other | Attending: Internal Medicine | Admitting: Internal Medicine

## 2021-05-12 ENCOUNTER — Telehealth: Payer: Self-pay | Admitting: Internal Medicine

## 2021-05-12 ENCOUNTER — Encounter: Payer: Self-pay | Admitting: Internal Medicine

## 2021-05-12 DIAGNOSIS — N189 Chronic kidney disease, unspecified: Secondary | ICD-10-CM | POA: Insufficient documentation

## 2021-05-12 DIAGNOSIS — D649 Anemia, unspecified: Secondary | ICD-10-CM | POA: Insufficient documentation

## 2021-05-12 NOTE — Telephone Encounter (Signed)
On 6/01-spoke with patient's family to have patient call back regarding use of Retacrit, as discussed with Dr. Ouida Sills; and Dr. Zollie Scale.  GB

## 2021-05-12 NOTE — Progress Notes (Signed)
Patient here for oncology follow-up appointment, expresses concerns of shortness of breath with exertion, fatigue, constipation

## 2021-05-12 NOTE — Progress Notes (Signed)
[facility] OFFICE PROGRESS NOTE  Patient Care Team: [name], MD as PCP - General (Family Medicine) [name], MD as Consulting Physician (Nephrology) Cammie Sickle, MD as Consulting Physician (Internal Medicine)   SUMMARY OF HEMATOLOGIC-ONCOLOGIC HISTORY:  # 2002- HEREDITARY HEMOCHROMATOSIS HOMOZYGOUS [goal ferritin 100-150]    # 2012- BMbx [sec to Anemia]-Normo-cellular; T Large Granular Lymphocytes by flowcytometry [4.5%] primary vs Reactive; FISH MDS- NEG  # Hx of Stroke [2012; no deficits]; CKD stage III [Dr.Lateef; ? Kidney Bx]     INTERVAL HISTORY:  65 year old female patient with a prior history of homozygous hereditary hemochromatosis; recently diagnosed chronic kidney disease/anemia is here for follow-up.  Patient's complex ovarian cyst surgery is pending-given anemia/chronic kidney disease.  Patient has been evaluated by Dr. Latif-considering kidney biopsy given any obvious etiology.  Patient is quite emotional-her husband has stroke.   Patient is obviously concerned/nervous given her clinical situation.  Review of Systems  Constitutional: Positive for malaise/fatigue. Negative for chills, diaphoresis, fever and weight loss.  HENT: Negative for nosebleeds and sore throat.   Eyes: Negative for double vision.  Respiratory: Negative for cough, hemoptysis, sputum production, shortness of breath and wheezing.   Cardiovascular: Negative for chest pain, palpitations, orthopnea and leg swelling.  Gastrointestinal: Negative for abdominal pain, blood in stool, constipation, diarrhea, heartburn, melena, nausea and vomiting.  Genitourinary: Negative for dysuria, frequency and urgency.  Musculoskeletal: Positive for back pain. Negative for joint pain.  Skin: Negative.  Negative for itching and rash.  Neurological: Negative for dizziness, tingling, focal weakness, weakness and headaches.  Endo/Heme/Allergies: Does not bruise/bleed easily.   Psychiatric/Behavioral: Negative for depression. The patient is not nervous/anxious and does not have insomnia.       PAST MEDICAL HISTORY :  Past Medical History:  Diagnosis Date  . Abnormal CT scan, sinus    VASCULAR ABNORMALITY  . Anemia   . Anxiety   . Back pain   . Breast cyst 8+ years    HAD SURGICAL F/U WITH DR Pat Patrick   . Elevated ferritin level   . Hemochromatosis    Homozygous on genetic testing March 2002  . Hypertension     PAST SURGICAL HISTORY :   Past Surgical History:  Procedure Laterality Date  . BONE MARROW BIOPSY  01/26/2011   FLOW SHOWS POSITIVE FOR LGL'S, FISH IS NEG,  . BREAST EXCISIONAL BIOPSY Right 2011   Negative  . CHOLECYSTECTOMY    . COLONOSCOPY  07/2011  . ESOPHAGOGASTRODUODENOSCOPY  07/2011    FAMILY HISTORY :   Family History  Problem Relation Age of Onset  . Ovarian cancer Mother        BRCA status reported negative  . Cancer Mother   . Heart disease Mother   . Cancer - Ovarian Mother   . Cancer Father   . Lung cancer Father   . Breast cancer Paternal Aunt     SOCIAL HISTORY:   Social History   Tobacco Use  . Smoking status: Never Smoker  . Smokeless tobacco: Never Used  Vaping Use  . Vaping Use: Never used  Substance Use Topics  . Alcohol use: Yes    Comment: social  . Drug use: No    ALLERGIES:  is allergic to amlodipine, hydromorphone, norvasc [amlodipine besylate], and sulfa antibiotics.  MEDICATIONS:  Current Outpatient Medications  Medication Sig Dispense Refill  . ALPRAZolam (XANAX) 0.25 MG tablet Take 0.25 mg by mouth at bedtime as needed for anxiety. 0.5 tablet to 1 whole tablet  every 12 hours as needed for anxiety    . aspirin 81 MG chewable tablet Chew 81 mg by mouth once.     . Biotin 1 MG CAPS Take 1 capsule by mouth 1 day or 1 dose.    . cetirizine (ZYRTEC) 10 MG tablet Take 10 mg by mouth daily.    . Cholecalciferol 1000 UNITS capsule Take 1,000 Units by mouth 2 (two) times daily.    . [employer] FIBER GUMMIES PO  Take 1 tablet by mouth daily.    . folic acid (FOLVITE) 1 MG tablet Take 1 mg by mouth daily.    Marland Kitchen levothyroxine (SYNTHROID, LEVOTHROID) 50 MCG tablet Take 1 tablet by mouth daily.    Marland Kitchen lisinopril (PRINIVIL,ZESTRIL) 20 MG tablet Take 20 mg by mouth daily.    Marland Kitchen venlafaxine (EFFEXOR) 75 MG tablet Take 75 mg by mouth 2 (two) times daily with a meal.     . gabapentin (NEURONTIN) 300 MG capsule Take 300 mg by mouth 2 (two) times daily. (Patient not taking: Reported on 05/12/2021)    . triamcinolone (NASACORT) 55 MCG/ACT AERO nasal inhaler Place 1 spray into the nose as needed.  (Patient not taking: Reported on 05/12/2021)     No current facility-administered medications for this visit.    PHYSICAL EXAMINATION: ECOG PERFORMANCE STATUS: 0 - Asymptomatic  BP 121/82   Pulse 86   Temp 99 F (37.2 C) (Tympanic)   Resp 18   Wt 72.1 kg   SpO2 98%   BMI 29.08 kg/m   Filed Weights   05/12/21 1309  Weight: 72.1 kg    Physical Exam HENT:     Head: Normocephalic and atraumatic.     Mouth/Throat:     Pharynx: No oropharyngeal exudate.  Eyes:     Pupils: Pupils are equal, round, and reactive to light.  Cardiovascular:     Rate and Rhythm: Normal rate and regular rhythm.  Pulmonary:     Effort: No respiratory distress.     Breath sounds: No wheezing.  Abdominal:     General: Bowel sounds are normal. There is no distension.     Palpations: Abdomen is soft. There is no mass.     Tenderness: There is no abdominal tenderness. There is no guarding or rebound.  Musculoskeletal:        General: No tenderness. Normal range of motion.     Cervical back: Normal range of motion and neck supple.  Skin:    General: Skin is warm.  Neurological:     Mental Status: She is alert and oriented to person, place, and time.  Psychiatric:        Mood and Affect: Affect normal.      LABORATORY DATA:  I have reviewed the data as listed    Component Value Date/Time   NA 132 (L) 05/11/2021 1109   NA 141  04/13/2018 1517   NA 142 05/19/2012 1957   K 4.8 05/11/2021 1109   K 4.6 05/19/2012 1957   CL 101 05/11/2021 1109   CL 108 (H) 05/19/2012 1957   CO2 21 (L) 05/11/2021 1109   CO2 26 05/19/2012 1957   GLUCOSE 89 05/11/2021 1109   GLUCOSE 94 05/19/2012 1957   BUN 41 (H) 05/11/2021 1109   BUN 34 (H) 04/13/2018 1517   BUN 10 05/19/2012 1957   CREATININE 1.75 (H) 05/11/2021 1109   CREATININE 1.09 05/19/2012 1957   CALCIUM 8.9 05/11/2021 1109   CALCIUM 9.0 05/19/2012 1957   PROT  7.4 05/11/2021 1109   PROT 7.3 04/13/2018 1517   PROT 6.2 (L) 05/19/2012 1957   ALBUMIN 4.4 05/11/2021 1109   ALBUMIN 4.9 (H) 04/13/2018 1517   ALBUMIN 3.4 05/19/2012 1957   AST 23 05/11/2021 1109   AST 31 05/19/2012 1957   ALT 20 05/11/2021 1109   ALT 28 05/19/2012 1957   ALKPHOS 57 05/11/2021 1109   ALKPHOS 61 05/19/2012 1957   BILITOT 0.5 05/11/2021 1109   BILITOT 0.3 04/13/2018 1517   BILITOT 0.3 05/19/2012 1957   GFRNONAA 32 (L) 05/11/2021 1109   GFRNONAA 56 (L) 05/19/2012 1957   GFRAA 58 (L) 04/27/2020 1035   GFRAA >60 05/19/2012 1957    No results found for: SPEP, UPEP  Lab Results  Component Value Date   WBC 3.8 (L) 05/11/2021   NEUTROABS 1.8 05/11/2021   HGB 9.0 (L) 05/11/2021   HCT 26.2 (L) 05/11/2021   MCV 93.9 05/11/2021   PLT 218 05/11/2021      Chemistry      Component Value Date/Time   NA 132 (L) 05/11/2021 1109   NA 141 04/13/2018 1517   NA 142 05/19/2012 1957   K 4.8 05/11/2021 1109   K 4.6 05/19/2012 1957   CL 101 05/11/2021 1109   CL 108 (H) 05/19/2012 1957   CO2 21 (L) 05/11/2021 1109   CO2 26 05/19/2012 1957   BUN 41 (H) 05/11/2021 1109   BUN 34 (H) 04/13/2018 1517   BUN 10 05/19/2012 1957   CREATININE 1.75 (H) 05/11/2021 1109   CREATININE 1.09 05/19/2012 1957      Component Value Date/Time   CALCIUM 8.9 05/11/2021 1109   CALCIUM 9.0 05/19/2012 1957   ALKPHOS 57 05/11/2021 1109   ALKPHOS 61 05/19/2012 1957   AST 23 05/11/2021 1109   AST 31 05/19/2012 1957    ALT 20 05/11/2021 1109   ALT 28 05/19/2012 1957   BILITOT 0.5 05/11/2021 1109   BILITOT 0.3 04/13/2018 1517   BILITOT 0.3 05/19/2012 1957        ASSESSMENT & PLAN:   Normocytic anemia # Recent worsening anemia-hemoglobin 8.3-9.0-likely secondary to CKD-III myeloma work-up negative.  Recommend EPO /Retacrit injections weekly basis; goal hemoglobin above 10.  Plan start next week.   #CKD-GFR in 30s creatinine 1.7 etiology unclear; October 2021 ultrasound kidneys negative; UA negative. ? NSIADs vs others.  Discussed with Dr. Zollie Scale; consider kidney biopsy  # Hereditary hemochromatosis- homozygous [as per patient]-without organ dysfunction; goal ferritin less than 100/saturation less than 50.  No phlebotomies.  # Complex ovarian cyst--discussed with Dr. Ouida Sills await surgery.  # DISPOSITION:  # Follow up in 2 months- MD; labs- cbc/bmp-Dr.B.   Cc; Dr.Bronstein    Cammie Sickle, MD 05/12/2021 1:59 PM

## 2021-05-12 NOTE — Assessment & Plan Note (Addendum)
#   Recent worsening anemia-hemoglobin 8.3-9.0-likely secondary to CKD-III myeloma work-up negative.  Recommend EPO /Retacrit injections weekly basis; goal hemoglobin above 10.  Plan start next week.   #CKD-GFR in 30s creatinine 1.7 etiology unclear; October 2021 ultrasound kidneys negative; UA negative. ? NSIADs vs others.  Discussed with Dr. Zollie Scale; consider kidney biopsy  # Hereditary hemochromatosis- homozygous [as per patient]-without organ dysfunction; goal ferritin less than 100/saturation less than 50.  No phlebotomies.  # Complex ovarian cyst--discussed with Dr. Ouida Sills await surgery.  # DISPOSITION:  # Follow up in 2 months- MD; labs- cbc/bmp-Dr.B.   Cc; Dr.Bronstein

## 2021-05-15 ENCOUNTER — Telehealth: Payer: Self-pay | Admitting: Internal Medicine

## 2021-05-15 NOTE — Telephone Encounter (Signed)
On 06/02- spoke to pt at length r: use of retacrit; and potential sideffects. Pt in agreement.  C- schedule RETACRIT weekly x3; statr week of June 6th.  June 13th; and June 20th week-Add- lab/H&H; possible retacrit.  GB  C- forward to Monday RN.

## 2021-05-17 ENCOUNTER — Other Ambulatory Visit: Payer: Self-pay

## 2021-05-17 DIAGNOSIS — D649 Anemia, unspecified: Secondary | ICD-10-CM

## 2021-05-17 NOTE — Telephone Encounter (Signed)
See note below will need labs for these days

## 2021-07-13 ENCOUNTER — Other Ambulatory Visit: Payer: Self-pay

## 2021-07-13 DIAGNOSIS — D649 Anemia, unspecified: Secondary | ICD-10-CM

## 2021-07-14 ENCOUNTER — Inpatient Hospital Stay: Payer: Medicare Other | Attending: Internal Medicine

## 2021-07-14 ENCOUNTER — Inpatient Hospital Stay: Payer: Medicare Other | Admitting: Internal Medicine

## 2021-07-14 ENCOUNTER — Inpatient Hospital Stay: Payer: Medicare Other

## 2021-07-14 ENCOUNTER — Other Ambulatory Visit: Payer: Self-pay

## 2021-07-14 DIAGNOSIS — N83209 Unspecified ovarian cyst, unspecified side: Secondary | ICD-10-CM | POA: Diagnosis not present

## 2021-07-14 DIAGNOSIS — D631 Anemia in chronic kidney disease: Secondary | ICD-10-CM | POA: Diagnosis not present

## 2021-07-14 DIAGNOSIS — D649 Anemia, unspecified: Secondary | ICD-10-CM

## 2021-07-14 DIAGNOSIS — N183 Chronic kidney disease, stage 3 unspecified: Secondary | ICD-10-CM | POA: Insufficient documentation

## 2021-07-14 LAB — BASIC METABOLIC PANEL
Anion gap: 7 (ref 5–15)
BUN: 32 mg/dL — ABNORMAL HIGH (ref 8–23)
CO2: 23 mmol/L (ref 22–32)
Calcium: 9.3 mg/dL (ref 8.9–10.3)
Chloride: 102 mmol/L (ref 98–111)
Creatinine, Ser: 1.56 mg/dL — ABNORMAL HIGH (ref 0.44–1.00)
GFR, Estimated: 36 mL/min — ABNORMAL LOW (ref >60)
Glucose, Bld: 103 mg/dL — ABNORMAL HIGH (ref 70–99)
Potassium: 4.6 mmol/L (ref 3.5–5.1)
Sodium: 132 mmol/L — ABNORMAL LOW (ref 135–145)

## 2021-07-14 LAB — CBC WITH DIFFERENTIAL/PLATELET
Abs Immature Granulocytes: 0.01 10*3/uL (ref 0.00–0.07)
Basophils Absolute: 0 10*3/uL (ref 0.0–0.1)
Basophils Relative: 0 %
Eosinophils Absolute: 0.1 10*3/uL (ref 0.0–0.5)
Eosinophils Relative: 3 %
HCT: 28.3 % — ABNORMAL LOW (ref 36.0–46.0)
Hemoglobin: 9.6 g/dL — ABNORMAL LOW (ref 12.0–15.0)
Immature Granulocytes: 0 %
Lymphocytes Relative: 33 %
Lymphs Abs: 1.2 10*3/uL (ref 0.7–4.0)
MCH: 32.9 pg (ref 26.0–34.0)
MCHC: 33.9 g/dL (ref 30.0–36.0)
MCV: 96.9 fL (ref 80.0–100.0)
Monocytes Absolute: 0.4 10*3/uL (ref 0.1–1.0)
Monocytes Relative: 11 %
Neutro Abs: 1.8 10*3/uL (ref 1.7–7.7)
Neutrophils Relative %: 53 %
Platelets: 222 10*3/uL (ref 150–400)
RBC: 2.92 MIL/uL — ABNORMAL LOW (ref 3.87–5.11)
RDW: 12.6 % (ref 11.5–15.5)
WBC: 3.5 10*3/uL — ABNORMAL LOW (ref 4.0–10.5)
nRBC: 0 % (ref 0.0–0.2)

## 2021-07-14 MED ORDER — EPOETIN ALFA-EPBX 10000 UNIT/ML IJ SOLN
20000.0000 [IU] | Freq: Once | INTRAMUSCULAR | Status: AC
  Administered 2021-07-14: 20000 [IU] via SUBCUTANEOUS
  Filled 2021-07-14: qty 2

## 2021-07-14 NOTE — Assessment & Plan Note (Addendum)
#   worsening anemia-hemoglobin 8.3-9.0-likely secondary to CKD-III myeloma work-up negative.  No evidence of iron deficiency.  Today hemoglobin is 9.6-improved. Recommend EPO /Retacrit injections weekly basis; goal hemoglobin above 10.   #CKD-GFR in 36- creatinine 1.5 etiology unclear; October 2021 ultrasound kidneys negative; UA negative. ? NSIADs vs others.  No Kidney Bx.   # Hereditary hemochromatosis- homozygous [as per patient]-without organ dysfunction; goal ferritin less than 100/saturation less than 50.  No phlebotomies/see above.  # Complex ovarian cyst-given the spontaneous improvement in the renal function/anemia-will inform Dr. Ouida Sills for planning surgery once hemoglobin I s above 10.  # DISPOSITION:  # RETACRIT TODAY # weekly H&H/possible retacrit x4  # Follow up in 1 month- MD; labs- cbc/bmp/possible retacrit-Dr.B.   Cc; Dr.Bronstein

## 2021-07-14 NOTE — Progress Notes (Signed)
Pt reports no changes or concerns since her last visit.

## 2021-07-15 ENCOUNTER — Encounter: Payer: Self-pay | Admitting: Internal Medicine

## 2021-07-15 NOTE — Progress Notes (Signed)
[place] OFFICE PROGRESS NOTE  Patient Care Team: [name], MD as PCP - General (Family Medicine) [name], MD as Consulting Physician (Nephrology) Cammie Sickle, MD as Consulting Physician (Internal Medicine)   SUMMARY OF HEMATOLOGIC-ONCOLOGIC HISTORY:  # 2002- HEREDITARY HEMOCHROMATOSIS HOMOZYGOUS [goal ferritin 100-150]    # 2012- BMbx [sec to Anemia]-Normo-cellular; T Large Granular Lymphocytes by flowcytometry [4.5%] primary vs Reactive; FISH MDS- NEG  # Hx of Stroke [2012; no deficits]; CKD stage III [Dr.Lateef; ? Kidney Bx]     INTERVAL HISTORY:  65 year old female patient with a prior history of homozygous hereditary hemochromatosis; recently diagnosed chronic kidney disease/anemia is here for follow-up.  Patient's complex ovarian cyst surgery is pending-given anemia/chronic kidney disease.  The etiology of patient's chronic kidney disease is unclear-currently kidney biopsy is on hold.  Patient was counseled regarding the start Retacrit injections for 2 months ago.  However for unclear reasons this was not scheduled; patient did not call.  However she continues to mild fatigue.  Denies any nausea vomiting abdominal pain.  No blood in stools or black or stools.  Review of Systems  Constitutional:  Positive for malaise/fatigue. Negative for chills, diaphoresis, fever and weight loss.  HENT:  Negative for nosebleeds and sore throat.   Eyes:  Negative for double vision.  Respiratory:  Negative for cough, hemoptysis, sputum production, shortness of breath and wheezing.   Cardiovascular:  Negative for chest pain, palpitations, orthopnea and leg swelling.  Gastrointestinal:  Negative for abdominal pain, blood in stool, constipation, diarrhea, heartburn, melena, nausea and vomiting.  Genitourinary:  Negative for dysuria, frequency and urgency.  Musculoskeletal:  Positive for back pain. Negative for joint pain.  Skin: Negative.  Negative for  itching and rash.  Neurological:  Negative for dizziness, tingling, focal weakness, weakness and headaches.  Endo/Heme/Allergies:  Does not bruise/bleed easily.  Psychiatric/Behavioral:  Negative for depression. The patient is not nervous/anxious and does not have insomnia.      PAST MEDICAL HISTORY :  Past Medical History:  Diagnosis Date   Abnormal CT scan, sinus    VASCULAR ABNORMALITY   Anemia    Anxiety    Back pain    Breast cyst 8+ years    HAD SURGICAL F/U WITH DR Pat Patrick    Elevated ferritin level    Hemochromatosis    Homozygous on genetic testing March 2002   Hypertension     PAST SURGICAL HISTORY :   Past Surgical History:  Procedure Laterality Date   BONE MARROW BIOPSY  01/26/2011   FLOW SHOWS POSITIVE FOR LGL'S, FISH IS NEG,   BREAST EXCISIONAL BIOPSY Right 2011   Negative   CHOLECYSTECTOMY     COLONOSCOPY  07/2011   ESOPHAGOGASTRODUODENOSCOPY  07/2011    FAMILY HISTORY :   Family History  Problem Relation Age of Onset   Ovarian cancer Mother        BRCA status reported negative   Cancer Mother    Heart disease Mother    Cancer - Ovarian Mother    Cancer Father    Lung cancer Father    Breast cancer Paternal Aunt     SOCIAL HISTORY:   Social History   Tobacco Use   Smoking status: Never   Smokeless tobacco: Never  Vaping Use   Vaping Use: Never used  Substance Use Topics   Alcohol use: Yes    Comment: social   Drug use: No    ALLERGIES:  is allergic to amlodipine, hydromorphone, norvasc [  amlodipine besylate], and sulfa antibiotics.  MEDICATIONS:  Current Outpatient Medications  Medication Sig Dispense Refill   ALPRAZolam (XANAX) 0.25 MG tablet Take 0.25 mg by mouth at bedtime as needed for anxiety. 0.5 tablet to 1 whole tablet every 12 hours as needed for anxiety     aspirin 81 MG chewable tablet Chew 81 mg by mouth once.      cetirizine (ZYRTEC) 10 MG tablet Take 10 mg by mouth daily.     Cholecalciferol 1000 UNITS capsule Take 1,000 Units  by mouth 2 (two) times daily.     [employer] FIBER GUMMIES PO Take 1 tablet by mouth daily.     folic acid (FOLVITE) 1 MG tablet Take 1 mg by mouth daily.     gabapentin (NEURONTIN) 300 MG capsule Take 300 mg by mouth 2 (two) times daily.     levothyroxine (SYNTHROID, LEVOTHROID) 50 MCG tablet Take 1 tablet by mouth daily.     lisinopril (PRINIVIL,ZESTRIL) 20 MG tablet Take 20 mg by mouth daily.     triamcinolone (NASACORT) 55 MCG/ACT AERO nasal inhaler Place 1 spray into the nose as needed.     venlafaxine (EFFEXOR) 75 MG tablet Take 75 mg by mouth 2 (two) times daily with a meal.      Biotin 1 MG CAPS Take 1 capsule by mouth 1 day or 1 dose. (Patient not taking: Reported on 07/14/2021)     No current facility-administered medications for this visit.    PHYSICAL EXAMINATION: ECOG PERFORMANCE STATUS: 0 - Asymptomatic  BP 118/76   Pulse 72   Temp 97.6 F (36.4 C) (Oral)   Resp 18   Wt 163 lb 7 oz (74.1 kg)   SpO2 100%   BMI 29.89 kg/m   Filed Weights   07/14/21 1303  Weight: 163 lb 7 oz (74.1 kg)    Physical Exam HENT:     Head: Normocephalic and atraumatic.     Mouth/Throat:     Pharynx: No oropharyngeal exudate.  Eyes:     Pupils: Pupils are equal, round, and reactive to light.  Cardiovascular:     Rate and Rhythm: Normal rate and regular rhythm.  Pulmonary:     Effort: No respiratory distress.     Breath sounds: No wheezing.  Abdominal:     General: Bowel sounds are normal. There is no distension.     Palpations: Abdomen is soft. There is no mass.     Tenderness: There is no abdominal tenderness. There is no guarding or rebound.  Musculoskeletal:        General: No tenderness. Normal range of motion.     Cervical back: Normal range of motion and neck supple.  Skin:    General: Skin is warm.  Neurological:     Mental Status: She is alert and oriented to person, place, and time.  Psychiatric:        Mood and Affect: Affect normal.     LABORATORY DATA:  I have  reviewed the data as listed    Component Value Date/Time   NA 132 (L) 07/14/2021 1210   NA 141 04/13/2018 1517   NA 142 05/19/2012 1957   K 4.6 07/14/2021 1210   K 4.6 05/19/2012 1957   CL 102 07/14/2021 1210   CL 108 (H) 05/19/2012 1957   CO2 23 07/14/2021 1210   CO2 26 05/19/2012 1957   GLUCOSE 103 (H) 07/14/2021 1210   GLUCOSE 94 05/19/2012 1957   BUN 32 (H) 07/14/2021 1210  BUN 34 (H) 04/13/2018 1517   BUN 10 05/19/2012 1957   CREATININE 1.56 (H) 07/14/2021 1210   CREATININE 1.09 05/19/2012 1957   CALCIUM 9.3 07/14/2021 1210   CALCIUM 9.0 05/19/2012 1957   PROT 7.4 05/11/2021 1109   PROT 7.3 04/13/2018 1517   PROT 6.2 (L) 05/19/2012 1957   ALBUMIN 4.4 05/11/2021 1109   ALBUMIN 4.9 (H) 04/13/2018 1517   ALBUMIN 3.4 05/19/2012 1957   AST 23 05/11/2021 1109   AST 31 05/19/2012 1957   ALT 20 05/11/2021 1109   ALT 28 05/19/2012 1957   ALKPHOS 57 05/11/2021 1109   ALKPHOS 61 05/19/2012 1957   BILITOT 0.5 05/11/2021 1109   BILITOT 0.3 04/13/2018 1517   BILITOT 0.3 05/19/2012 1957   GFRNONAA 36 (L) 07/14/2021 1210   GFRNONAA 56 (L) 05/19/2012 1957   GFRAA 58 (L) 04/27/2020 1035   GFRAA >60 05/19/2012 1957    No results found for: SPEP, UPEP  Lab Results  Component Value Date   WBC 3.5 (L) 07/14/2021   NEUTROABS 1.8 07/14/2021   HGB 9.6 (L) 07/14/2021   HCT 28.3 (L) 07/14/2021   MCV 96.9 07/14/2021   PLT 222 07/14/2021      Chemistry      Component Value Date/Time   NA 132 (L) 07/14/2021 1210   NA 141 04/13/2018 1517   NA 142 05/19/2012 1957   K 4.6 07/14/2021 1210   K 4.6 05/19/2012 1957   CL 102 07/14/2021 1210   CL 108 (H) 05/19/2012 1957   CO2 23 07/14/2021 1210   CO2 26 05/19/2012 1957   BUN 32 (H) 07/14/2021 1210   BUN 34 (H) 04/13/2018 1517   BUN 10 05/19/2012 1957   CREATININE 1.56 (H) 07/14/2021 1210   CREATININE 1.09 05/19/2012 1957      Component Value Date/Time   CALCIUM 9.3 07/14/2021 1210   CALCIUM 9.0 05/19/2012 1957   ALKPHOS 57  05/11/2021 1109   ALKPHOS 61 05/19/2012 1957   AST 23 05/11/2021 1109   AST 31 05/19/2012 1957   ALT 20 05/11/2021 1109   ALT 28 05/19/2012 1957   BILITOT 0.5 05/11/2021 1109   BILITOT 0.3 04/13/2018 1517   BILITOT 0.3 05/19/2012 1957        ASSESSMENT & PLAN:   Normocytic anemia # worsening anemia-hemoglobin 8.3-9.0-likely secondary to CKD-III myeloma work-up negative.  No evidence of iron deficiency.  Today hemoglobin is 9.6-improved. Recommend EPO /Retacrit injections weekly basis; goal hemoglobin above 10.   #CKD-GFR in 36- creatinine 1.5 etiology unclear; October 2021 ultrasound kidneys negative; UA negative. ? NSIADs vs others.  No Kidney Bx.   # Hereditary hemochromatosis- homozygous [as per patient]-without organ dysfunction; goal ferritin less than 100/saturation less than 50.  No phlebotomies/see above.  # Complex ovarian cyst-given the spontaneous improvement in the renal function/anemia-will inform Dr. Ouida Sills for planning surgery once hemoglobin I s above 10.  # DISPOSITION:  # RETACRIT TODAY # weekly H&H/possible retacrit x4  # Follow up in 1 month- MD; labs- cbc/bmp/possible retacrit-Dr.B.   Cc; Dr.Bronstein    Cammie Sickle, MD 07/15/2021 7:14 AM

## 2021-07-21 ENCOUNTER — Inpatient Hospital Stay: Payer: Medicare Other

## 2021-07-21 DIAGNOSIS — D649 Anemia, unspecified: Secondary | ICD-10-CM

## 2021-07-21 LAB — HEMOGLOBIN AND HEMATOCRIT, BLOOD
HCT: 31.2 % — ABNORMAL LOW (ref 36.0–46.0)
Hemoglobin: 10.3 g/dL — ABNORMAL LOW (ref 12.0–15.0)

## 2021-07-28 ENCOUNTER — Other Ambulatory Visit: Payer: Self-pay

## 2021-07-28 ENCOUNTER — Telehealth: Payer: Self-pay | Admitting: Internal Medicine

## 2021-07-28 ENCOUNTER — Inpatient Hospital Stay: Payer: Medicare Other

## 2021-07-28 ENCOUNTER — Other Ambulatory Visit: Payer: Self-pay | Admitting: *Deleted

## 2021-07-28 DIAGNOSIS — D649 Anemia, unspecified: Secondary | ICD-10-CM

## 2021-07-28 LAB — HEMOGLOBIN AND HEMATOCRIT, BLOOD
HCT: 31.4 % — ABNORMAL LOW (ref 36.0–46.0)
Hemoglobin: 10.5 g/dL — ABNORMAL LOW (ref 12.0–15.0)

## 2021-07-28 NOTE — Telephone Encounter (Signed)
On 8/17-discussed with Dr. Ouida Sills results of the blood work/anemia stable.  Proceed with surgery as planned  GB

## 2021-08-04 ENCOUNTER — Inpatient Hospital Stay: Payer: Medicare Other

## 2021-08-04 DIAGNOSIS — D649 Anemia, unspecified: Secondary | ICD-10-CM

## 2021-08-04 LAB — HEMOGLOBIN: Hemoglobin: 10.7 g/dL — ABNORMAL LOW (ref 12.0–15.0)

## 2021-08-04 LAB — HEMATOCRIT: HCT: 32 % — ABNORMAL LOW (ref 36.0–46.0)

## 2021-08-10 ENCOUNTER — Other Ambulatory Visit: Payer: Self-pay | Admitting: *Deleted

## 2021-08-10 DIAGNOSIS — D649 Anemia, unspecified: Secondary | ICD-10-CM

## 2021-08-11 ENCOUNTER — Inpatient Hospital Stay: Payer: Medicare Other

## 2021-08-11 DIAGNOSIS — D649 Anemia, unspecified: Secondary | ICD-10-CM

## 2021-08-11 LAB — HEMATOCRIT: HCT: 32 % — ABNORMAL LOW (ref 36.0–46.0)

## 2021-08-11 LAB — HEMOGLOBIN: Hemoglobin: 10.8 g/dL — ABNORMAL LOW (ref 12.0–15.0)

## 2021-08-18 ENCOUNTER — Inpatient Hospital Stay: Payer: Medicare Other | Admitting: Internal Medicine

## 2021-08-18 ENCOUNTER — Inpatient Hospital Stay: Payer: Medicare Other

## 2021-08-18 ENCOUNTER — Encounter: Payer: Self-pay | Admitting: Internal Medicine

## 2021-08-18 ENCOUNTER — Telehealth: Payer: Self-pay | Admitting: *Deleted

## 2021-08-18 ENCOUNTER — Inpatient Hospital Stay: Payer: Medicare Other | Attending: Internal Medicine

## 2021-08-18 DIAGNOSIS — D649 Anemia, unspecified: Secondary | ICD-10-CM | POA: Diagnosis not present

## 2021-08-18 DIAGNOSIS — N183 Chronic kidney disease, stage 3 unspecified: Secondary | ICD-10-CM | POA: Diagnosis not present

## 2021-08-18 LAB — CBC WITH DIFFERENTIAL/PLATELET
Abs Immature Granulocytes: 0 10*3/uL (ref 0.00–0.07)
Basophils Absolute: 0 10*3/uL (ref 0.0–0.1)
Basophils Relative: 1 %
Eosinophils Absolute: 0.3 10*3/uL (ref 0.0–0.5)
Eosinophils Relative: 8 %
HCT: 31.4 % — ABNORMAL LOW (ref 36.0–46.0)
Hemoglobin: 10.8 g/dL — ABNORMAL LOW (ref 12.0–15.0)
Immature Granulocytes: 0 %
Lymphocytes Relative: 36 %
Lymphs Abs: 1.4 10*3/uL (ref 0.7–4.0)
MCH: 33.1 pg (ref 26.0–34.0)
MCHC: 34.4 g/dL (ref 30.0–36.0)
MCV: 96.3 fL (ref 80.0–100.0)
Monocytes Absolute: 0.5 10*3/uL (ref 0.1–1.0)
Monocytes Relative: 13 %
Neutro Abs: 1.7 10*3/uL (ref 1.7–7.7)
Neutrophils Relative %: 42 %
Platelets: 253 10*3/uL (ref 150–400)
RBC: 3.26 MIL/uL — ABNORMAL LOW (ref 3.87–5.11)
RDW: 12.8 % (ref 11.5–15.5)
WBC: 3.9 10*3/uL — ABNORMAL LOW (ref 4.0–10.5)
nRBC: 0 % (ref 0.0–0.2)

## 2021-08-18 LAB — BASIC METABOLIC PANEL
Anion gap: 8 (ref 5–15)
BUN: 29 mg/dL — ABNORMAL HIGH (ref 8–23)
CO2: 27 mmol/L (ref 22–32)
Calcium: 9.5 mg/dL (ref 8.9–10.3)
Chloride: 101 mmol/L (ref 98–111)
Creatinine, Ser: 1.47 mg/dL — ABNORMAL HIGH (ref 0.44–1.00)
GFR, Estimated: 39 mL/min — ABNORMAL LOW (ref >60)
Glucose, Bld: 106 mg/dL — ABNORMAL HIGH (ref 70–99)
Potassium: 4.4 mmol/L (ref 3.5–5.1)
Sodium: 136 mmol/L (ref 135–145)

## 2021-08-18 NOTE — Progress Notes (Signed)
Pt in for follow up, reports energy level are "about the same".

## 2021-08-18 NOTE — Progress Notes (Signed)
[facility] OFFICE PROGRESS NOTE  Patient Care Team: [name], MD as PCP - General (Family Medicine) [name], MD as Consulting Physician (Nephrology) Cammie Sickle, MD as Consulting Physician (Internal Medicine)   SUMMARY OF HEMATOLOGIC-ONCOLOGIC HISTORY:  # 2002- HEREDITARY HEMOCHROMATOSIS HOMOZYGOUS [goal ferritin 100-150]    # 2012- BMbx [sec to Anemia]-Normo-cellular; T Large Granular Lymphocytes by flowcytometry [4.5%] primary vs Reactive; FISH MDS- NEG  # Hx of Stroke [2012; no deficits]; CKD stage III [Dr.Lateef; ? Kidney Bx]     INTERVAL HISTORY:  65 year old female patient with a prior history of homozygous hereditary hemochromatosis; recently diagnosed chronic kidney disease/anemia is here for follow-up.  Patient is here 1 dose of Retacrit approximately 1 month ago-since then has not needed injection as a hemoglobin has been about 10.  Patient has not contacted Dr. Ouida Sills for upcoming surgery yet.  Otherwise chronic mild fatigue no worsening shortness of breath or cough.  No blood in stools or black or stools.   Review of Systems  Constitutional:  Positive for malaise/fatigue. Negative for chills, diaphoresis, fever and weight loss.  HENT:  Negative for nosebleeds and sore throat.   Eyes:  Negative for double vision.  Respiratory:  Negative for cough, hemoptysis, sputum production, shortness of breath and wheezing.   Cardiovascular:  Negative for chest pain, palpitations, orthopnea and leg swelling.  Gastrointestinal:  Negative for abdominal pain, blood in stool, constipation, diarrhea, heartburn, melena, nausea and vomiting.  Genitourinary:  Negative for dysuria, frequency and urgency.  Musculoskeletal:  Positive for back pain. Negative for joint pain.  Skin: Negative.  Negative for itching and rash.  Neurological:  Negative for dizziness, tingling, focal weakness, weakness and headaches.  Endo/Heme/Allergies:  Does not  bruise/bleed easily.  Psychiatric/Behavioral:  Negative for depression. The patient is not nervous/anxious and does not have insomnia.      PAST MEDICAL HISTORY :  Past Medical History:  Diagnosis Date   Abnormal CT scan, sinus    VASCULAR ABNORMALITY   Anemia    Anxiety    Back pain    Breast cyst 8+ years    HAD SURGICAL F/U WITH DR Pat Patrick    Elevated ferritin level    Hemochromatosis    Homozygous on genetic testing March 2002   Hypertension     PAST SURGICAL HISTORY :   Past Surgical History:  Procedure Laterality Date   BONE MARROW BIOPSY  01/26/2011   FLOW SHOWS POSITIVE FOR LGL'S, FISH IS NEG,   BREAST EXCISIONAL BIOPSY Right 2011   Negative   CHOLECYSTECTOMY     COLONOSCOPY  07/2011   ESOPHAGOGASTRODUODENOSCOPY  07/2011    FAMILY HISTORY :   Family History  Problem Relation Age of Onset   Ovarian cancer Mother        BRCA status reported negative   Cancer Mother    Heart disease Mother    Cancer - Ovarian Mother    Cancer Father    Lung cancer Father    Breast cancer Paternal Aunt     SOCIAL HISTORY:   Social History   Tobacco Use   Smoking status: Never   Smokeless tobacco: Never  Vaping Use   Vaping Use: Never used  Substance Use Topics   Alcohol use: Yes    Comment: social   Drug use: No    ALLERGIES:  is allergic to amlodipine, hydromorphone, norvasc [amlodipine besylate], and sulfa antibiotics.  MEDICATIONS:  Current Outpatient Medications  Medication Sig Dispense Refill  aspirin 81 MG chewable tablet Chew 81 mg by mouth once.      cetirizine (ZYRTEC) 10 MG tablet Take 10 mg by mouth daily.     Cholecalciferol 1000 UNITS capsule Take 1,000 Units by mouth 2 (two) times daily.     [employer] FIBER GUMMIES PO Take 1 tablet by mouth daily.     folic acid (FOLVITE) 1 MG tablet Take 1 mg by mouth daily.     gabapentin (NEURONTIN) 300 MG capsule Take 300 mg by mouth 2 (two) times daily.     levothyroxine (SYNTHROID, LEVOTHROID) 50 MCG tablet Take 1  tablet by mouth daily.     lisinopril (PRINIVIL,ZESTRIL) 20 MG tablet Take 20 mg by mouth daily.     triamcinolone (NASACORT) 55 MCG/ACT AERO nasal inhaler Place 1 spray into the nose as needed.     venlafaxine (EFFEXOR) 75 MG tablet Take 75 mg by mouth 2 (two) times daily with a meal.      ALPRAZolam (XANAX) 0.25 MG tablet Take 0.25 mg by mouth at bedtime as needed for anxiety. 0.5 tablet to 1 whole tablet every 12 hours as needed for anxiety (Patient not taking: Reported on 08/18/2021)     No current facility-administered medications for this visit.    PHYSICAL EXAMINATION: ECOG PERFORMANCE STATUS: 0 - Asymptomatic  BP 122/84 (BP Location: Left Arm, Patient Position: Sitting)   Pulse 76   Temp 98.7 F (37.1 C) (Tympanic)   Resp 16   Wt 165 lb (74.8 kg)   SpO2 100%   BMI 30.18 kg/m   Filed Weights   08/18/21 1031  Weight: 165 lb (74.8 kg)    Physical Exam HENT:     Head: Normocephalic and atraumatic.     Mouth/Throat:     Pharynx: No oropharyngeal exudate.  Eyes:     Pupils: Pupils are equal, round, and reactive to light.  Cardiovascular:     Rate and Rhythm: Normal rate and regular rhythm.  Pulmonary:     Effort: No respiratory distress.     Breath sounds: No wheezing.  Abdominal:     General: Bowel sounds are normal. There is no distension.     Palpations: Abdomen is soft. There is no mass.     Tenderness: There is no abdominal tenderness. There is no guarding or rebound.  Musculoskeletal:        General: No tenderness. Normal range of motion.     Cervical back: Normal range of motion and neck supple.  Skin:    General: Skin is warm.  Neurological:     Mental Status: She is alert and oriented to person, place, and time.  Psychiatric:        Mood and Affect: Affect normal.     LABORATORY DATA:  I have reviewed the data as listed    Component Value Date/Time   NA 136 08/18/2021 0949   NA 141 04/13/2018 1517   NA 142 05/19/2012 1957   K 4.4 08/18/2021 0949    K 4.6 05/19/2012 1957   CL 101 08/18/2021 0949   CL 108 (H) 05/19/2012 1957   CO2 27 08/18/2021 0949   CO2 26 05/19/2012 1957   GLUCOSE 106 (H) 08/18/2021 0949   GLUCOSE 94 05/19/2012 1957   BUN 29 (H) 08/18/2021 0949   BUN 34 (H) 04/13/2018 1517   BUN 10 05/19/2012 1957   CREATININE 1.47 (H) 08/18/2021 0949   CREATININE 1.09 05/19/2012 1957   CALCIUM 9.5 08/18/2021 0949   CALCIUM  9.0 05/19/2012 1957   PROT 7.4 05/11/2021 1109   PROT 7.3 04/13/2018 1517   PROT 6.2 (L) 05/19/2012 1957   ALBUMIN 4.4 05/11/2021 1109   ALBUMIN 4.9 (H) 04/13/2018 1517   ALBUMIN 3.4 05/19/2012 1957   AST 23 05/11/2021 1109   AST 31 05/19/2012 1957   ALT 20 05/11/2021 1109   ALT 28 05/19/2012 1957   ALKPHOS 57 05/11/2021 1109   ALKPHOS 61 05/19/2012 1957   BILITOT 0.5 05/11/2021 1109   BILITOT 0.3 04/13/2018 1517   BILITOT 0.3 05/19/2012 1957   GFRNONAA 39 (L) 08/18/2021 0949   GFRNONAA 56 (L) 05/19/2012 1957   GFRAA 58 (L) 04/27/2020 1035   GFRAA >60 05/19/2012 1957    No results found for: SPEP, UPEP  Lab Results  Component Value Date   WBC 3.9 (L) 08/18/2021   NEUTROABS 1.7 08/18/2021   HGB 10.8 (L) 08/18/2021   HCT 31.4 (L) 08/18/2021   MCV 96.3 08/18/2021   PLT 253 08/18/2021      Chemistry      Component Value Date/Time   NA 136 08/18/2021 0949   NA 141 04/13/2018 1517   NA 142 05/19/2012 1957   K 4.4 08/18/2021 0949   K 4.6 05/19/2012 1957   CL 101 08/18/2021 0949   CL 108 (H) 05/19/2012 1957   CO2 27 08/18/2021 0949   CO2 26 05/19/2012 1957   BUN 29 (H) 08/18/2021 0949   BUN 34 (H) 04/13/2018 1517   BUN 10 05/19/2012 1957   CREATININE 1.47 (H) 08/18/2021 0949   CREATININE 1.09 05/19/2012 1957      Component Value Date/Time   CALCIUM 9.5 08/18/2021 0949   CALCIUM 9.0 05/19/2012 1957   ALKPHOS 57 05/11/2021 1109   ALKPHOS 61 05/19/2012 1957   AST 23 05/11/2021 1109   AST 31 05/19/2012 1957   ALT 20 05/11/2021 1109   ALT 28 05/19/2012 1957   BILITOT 0.5  05/11/2021 1109   BILITOT 0.3 04/13/2018 1517   BILITOT 0.3 05/19/2012 1957        ASSESSMENT & PLAN:   Normocytic anemia # worsening anemia-hemoglobin 8.3-9.0-likely secondary to CKD-III myeloma work-up negative.  No evidence of iron deficiency.  Today hemoglobin is 10.3 -improved. STABLE; Recommend EPO /Retacrit injections  Every 2 weeks basis; goal hemoglobin above 10.   #CKD-GFR in 39- creatinine 1.5 etiology unclear; October 2021 ultrasound kidneys negative; UA negative. ? NSIADs vs others.  No Kidney Bx.  STABLE.   # Hereditary hemochromatosis- homozygous [as per patient]-without organ dysfunction; goal ferritin less than 100/saturation less than 50.  No phlebotomies/see above. STABLE>   # Complex ovarian cyst-given the spontaneous improvement in the renal function/anemia-will inform Dr. Ouida Sills for planning surgery once hemoglobin I s above 10. dsciussed wwith schemerhorn Please reach out to Dr. Tonette Bihari office regarding plan for surgery  # DISPOSITION:  # HOLD RETACRIT  # every 2 weeks- H&H/possible retacrit x4  # Follow up in 2 month- MD; labs- cbc/bmp/possible retacrit-Dr.B.   Cc; Dr.Bronstein    Cammie Sickle, MD 08/18/2021 1:28 PM

## 2021-08-18 NOTE — Telephone Encounter (Signed)
Dr. Ouida Sills can access the records via epic. He is at [facility]

## 2021-08-18 NOTE — Telephone Encounter (Signed)
Dr Schermerhorns office and informed receptionist that records can be seen in [place] and she states that she doe not know who would have told patient that because they are aware that they can see records.

## 2021-08-18 NOTE — Assessment & Plan Note (Addendum)
#   worsening anemia-hemoglobin 8.3-9.0-likely secondary to CKD-III myeloma work-up negative.  No evidence of iron deficiency.  Today hemoglobin is 10.3 -improved. STABLE; Recommend EPO /Retacrit injections  Every 2 weeks basis; goal hemoglobin above 10.   #CKD-GFR in 39- creatinine 1.5 etiology unclear; October 2021 ultrasound kidneys negative; UA negative. ? NSIADs vs others.  No Kidney Bx.  STABLE.   # Hereditary hemochromatosis- homozygous [as per patient]-without organ dysfunction; goal ferritin less than 100/saturation less than 50.  No phlebotomies/see above. STABLE>   # Complex ovarian cyst-given the spontaneous improvement in the renal function/anemia-will inform Dr. Ouida Sills for planning surgery once hemoglobin I s above 10. dsciussed wwith schemerhorn Please reach out to Dr. Tonette Bihari office regarding plan for surgery  # DISPOSITION:  # HOLD RETACRIT  # every 2 weeks- H&H/possible retacrit x4  # Follow up in 2 month- MD; labs- cbc/bmp/possible retacrit-Dr.B.   Cc; Dr.Bronstein

## 2021-08-18 NOTE — Patient Instructions (Signed)
#  Please reach out to Dr. Tonette Bihari office regarding plan for surgery

## 2021-08-18 NOTE — Telephone Encounter (Signed)
Patient called stating that Dr Ouida Sills wants pretty much all records faxed to him before he schedules surgery Fax # [phone]

## 2021-08-19 ENCOUNTER — Telehealth: Payer: Self-pay | Admitting: *Deleted

## 2021-09-01 ENCOUNTER — Inpatient Hospital Stay: Payer: Medicare Other

## 2021-09-01 DIAGNOSIS — D649 Anemia, unspecified: Secondary | ICD-10-CM

## 2021-09-01 LAB — HEMOGLOBIN: Hemoglobin: 10.4 g/dL — ABNORMAL LOW (ref 12.0–15.0)

## 2021-09-01 LAB — HEMATOCRIT: HCT: 30.4 % — ABNORMAL LOW (ref 36.0–46.0)

## 2021-09-15 ENCOUNTER — Inpatient Hospital Stay: Payer: Medicare Other | Attending: Internal Medicine

## 2021-09-15 ENCOUNTER — Inpatient Hospital Stay: Payer: Medicare Other

## 2021-09-15 DIAGNOSIS — N189 Chronic kidney disease, unspecified: Secondary | ICD-10-CM | POA: Diagnosis not present

## 2021-09-15 DIAGNOSIS — D649 Anemia, unspecified: Secondary | ICD-10-CM

## 2021-09-15 DIAGNOSIS — D631 Anemia in chronic kidney disease: Secondary | ICD-10-CM | POA: Diagnosis not present

## 2021-09-15 LAB — BASIC METABOLIC PANEL
Anion gap: 8 (ref 5–15)
BUN: 27 mg/dL — ABNORMAL HIGH (ref 8–23)
CO2: 24 mmol/L (ref 22–32)
Calcium: 9 mg/dL (ref 8.9–10.3)
Chloride: 103 mmol/L (ref 98–111)
Creatinine, Ser: 1.72 mg/dL — ABNORMAL HIGH (ref 0.44–1.00)
GFR, Estimated: 32 mL/min — ABNORMAL LOW (ref >60)
Glucose, Bld: 92 mg/dL (ref 70–99)
Potassium: 4.1 mmol/L (ref 3.5–5.1)
Sodium: 135 mmol/L (ref 135–145)

## 2021-09-15 LAB — HEMOGLOBIN: Hemoglobin: 9.9 g/dL — ABNORMAL LOW (ref 12.0–15.0)

## 2021-09-15 LAB — HEMATOCRIT: HCT: 29.3 % — ABNORMAL LOW (ref 36.0–46.0)

## 2021-09-15 MED ORDER — EPOETIN ALFA-EPBX 10000 UNIT/ML IJ SOLN
20000.0000 [IU] | Freq: Once | INTRAMUSCULAR | Status: AC
  Administered 2021-09-15: 20000 [IU] via SUBCUTANEOUS
  Filled 2021-09-15: qty 2

## 2021-09-29 ENCOUNTER — Inpatient Hospital Stay: Payer: Medicare Other

## 2021-09-29 ENCOUNTER — Other Ambulatory Visit: Payer: Self-pay

## 2021-09-29 DIAGNOSIS — D649 Anemia, unspecified: Secondary | ICD-10-CM

## 2021-09-29 LAB — HEMOGLOBIN: Hemoglobin: 11.6 g/dL — ABNORMAL LOW (ref 12.0–15.0)

## 2021-09-29 LAB — HEMATOCRIT: HCT: 34.6 % — ABNORMAL LOW (ref 36.0–46.0)

## 2021-10-13 ENCOUNTER — Inpatient Hospital Stay: Payer: Medicare Other | Admitting: Internal Medicine

## 2021-10-13 ENCOUNTER — Other Ambulatory Visit: Payer: Self-pay

## 2021-10-13 ENCOUNTER — Inpatient Hospital Stay: Payer: Medicare Other | Attending: Internal Medicine

## 2021-10-13 ENCOUNTER — Inpatient Hospital Stay: Payer: Medicare Other

## 2021-10-13 DIAGNOSIS — D649 Anemia, unspecified: Secondary | ICD-10-CM

## 2021-10-13 DIAGNOSIS — D631 Anemia in chronic kidney disease: Secondary | ICD-10-CM | POA: Diagnosis not present

## 2021-10-13 DIAGNOSIS — N189 Chronic kidney disease, unspecified: Secondary | ICD-10-CM | POA: Insufficient documentation

## 2021-10-13 LAB — CBC WITH DIFFERENTIAL/PLATELET
Abs Immature Granulocytes: 0 10*3/uL (ref 0.00–0.07)
Basophils Absolute: 0 10*3/uL (ref 0.0–0.1)
Basophils Relative: 1 %
Eosinophils Absolute: 0.2 10*3/uL (ref 0.0–0.5)
Eosinophils Relative: 4 %
HCT: 36.8 % (ref 36.0–46.0)
Hemoglobin: 12.2 g/dL (ref 12.0–15.0)
Immature Granulocytes: 0 %
Lymphocytes Relative: 36 %
Lymphs Abs: 1.5 10*3/uL (ref 0.7–4.0)
MCH: 32.4 pg (ref 26.0–34.0)
MCHC: 33.2 g/dL (ref 30.0–36.0)
MCV: 97.6 fL (ref 80.0–100.0)
Monocytes Absolute: 0.5 10*3/uL (ref 0.1–1.0)
Monocytes Relative: 11 %
Neutro Abs: 2 10*3/uL (ref 1.7–7.7)
Neutrophils Relative %: 48 %
Platelets: 356 10*3/uL (ref 150–400)
RBC: 3.77 MIL/uL — ABNORMAL LOW (ref 3.87–5.11)
RDW: 13.5 % (ref 11.5–15.5)
WBC: 4.1 10*3/uL (ref 4.0–10.5)
nRBC: 0 % (ref 0.0–0.2)

## 2021-10-13 LAB — BASIC METABOLIC PANEL
Anion gap: 10 (ref 5–15)
BUN: 39 mg/dL — ABNORMAL HIGH (ref 8–23)
CO2: 25 mmol/L (ref 22–32)
Calcium: 9.7 mg/dL (ref 8.9–10.3)
Chloride: 100 mmol/L (ref 98–111)
Creatinine, Ser: 1.65 mg/dL — ABNORMAL HIGH (ref 0.44–1.00)
GFR, Estimated: 34 mL/min — ABNORMAL LOW (ref >60)
Glucose, Bld: 80 mg/dL (ref 70–99)
Potassium: 4 mmol/L (ref 3.5–5.1)
Sodium: 135 mmol/L (ref 135–145)

## 2021-10-13 NOTE — Assessment & Plan Note (Addendum)
#   worsening anemia-hemoglobin 8.3-9.0-likely secondary to CKD-III.   No evidence of iron deficiency-on Retacrit.  # Today hemoglobin is 12.1 -improved.  Hold Retacrit today.  STABLE; continue  EPO /Retacrit injections q 2 weeks basis; goal hemoglobin above 10.   #CKD-GFR in 34- creatinine 1.7 etiology unclear; October 2021 ultrasound kidneys negative; UA negative. ? NSIADs vs others.  No Kidney Bx- STABLE.   # Hereditary hemochromatosis- homozygous [as per patient]-without organ dysfunction; goal ferritin less than 100/saturation less than 50.  No phlebotomies/see above. STABLE>   # Complex ovarian cyst--improvement in the renal function/anemia- [ Dr. Ouida Sills for planning surgery on 11/21]-  once hemoglobin is above 10.   # DISPOSITION:  # HOLD RETACRIT  # every 2 weeks- H&H/possible retacrit x 6  # Follow up in third week of JAN 2023 MD; labs- cbc/bmp/possible retacrit-Dr.B.   Cc; Dr.Bronstein

## 2021-10-13 NOTE — Progress Notes (Signed)
[facility] OFFICE PROGRESS NOTE  Patient Care Team: [name], MD as PCP - General (Family Medicine) [name], MD as Consulting Physician (Nephrology) Cammie Sickle, MD as Consulting Physician (Internal Medicine)   SUMMARY OF HEMATOLOGIC-ONCOLOGIC HISTORY:  # 2002- HEREDITARY HEMOCHROMATOSIS HOMOZYGOUS [goal ferritin 100-150]    # 2012- BMbx [sec to Anemia]-Normo-cellular; T Large Granular Lymphocytes by flowcytometry [4.5%] primary vs Reactive; FISH MDS- NEG  # Hx of Stroke [2012; no deficits]; CKD stage III [Dr.Lateef; ? Kidney Bx]     INTERVAL HISTORY: Alone.  Ambulating independently.  65 year old female patient with a prior history of homozygous hereditary hemochromatosis; recently diagnosed chronic kidney disease/anemia is here for follow-up.  In the interim patient has been evaluated by gynecology Dr.  Ouida Sills.  Ovarian surgery planned on November 21  Otherwise chronic mild fatigue no worsening shortness of breath or cough.  No blood in stools or black or stools.   Review of Systems  Constitutional:  Positive for malaise/fatigue. Negative for chills, diaphoresis, fever and weight loss.  HENT:  Negative for nosebleeds and sore throat.   Eyes:  Negative for double vision.  Respiratory:  Negative for cough, hemoptysis, sputum production, shortness of breath and wheezing.   Cardiovascular:  Negative for chest pain, palpitations, orthopnea and leg swelling.  Gastrointestinal:  Negative for abdominal pain, blood in stool, constipation, diarrhea, heartburn, melena, nausea and vomiting.  Genitourinary:  Negative for dysuria, frequency and urgency.  Musculoskeletal:  Positive for back pain. Negative for joint pain.  Skin: Negative.  Negative for itching and rash.  Neurological:  Negative for dizziness, tingling, focal weakness, weakness and headaches.  Endo/Heme/Allergies:  Does not bruise/bleed easily.  Psychiatric/Behavioral:  Negative  for depression. The patient is not nervous/anxious and does not have insomnia.      PAST MEDICAL HISTORY :  Past Medical History:  Diagnosis Date  . Abnormal CT scan, sinus    VASCULAR ABNORMALITY  . Anemia   . Anxiety   . Back pain   . Breast cyst 8+ years    HAD SURGICAL F/U WITH DR Pat Patrick   . Elevated ferritin level   . Hemochromatosis    Homozygous on genetic testing March 2002  . Hypertension     PAST SURGICAL HISTORY :   Past Surgical History:  Procedure Laterality Date  . BONE MARROW BIOPSY  01/26/2011   FLOW SHOWS POSITIVE FOR LGL'S, FISH IS NEG,  . BREAST EXCISIONAL BIOPSY Right 2011   Negative  . CHOLECYSTECTOMY    . COLONOSCOPY  07/2011  . ESOPHAGOGASTRODUODENOSCOPY  07/2011    FAMILY HISTORY :   Family History  Problem Relation Age of Onset  . Ovarian cancer Mother        BRCA status reported negative  . Cancer Mother   . Heart disease Mother   . Cancer - Ovarian Mother   . Cancer Father   . Lung cancer Father   . Breast cancer Paternal Aunt     SOCIAL HISTORY:   Social History   Tobacco Use  . Smoking status: Never  . Smokeless tobacco: Never  Vaping Use  . Vaping Use: Never used  Substance Use Topics  . Alcohol use: Yes    Comment: social  . Drug use: No    ALLERGIES:  is allergic to amlodipine, hydromorphone, norvasc [amlodipine besylate], and sulfa antibiotics.  MEDICATIONS:  Current Outpatient Medications  Medication Sig Dispense Refill  . aspirin 81 MG chewable tablet Chew 81 mg by mouth once.     Marland Kitchen  calcium carbonate (OS-CAL) 1250 (500 Ca) MG chewable tablet Chew by mouth.    . cetirizine (ZYRTEC) 10 MG tablet Take 10 mg by mouth daily.    . Cholecalciferol 1000 UNITS capsule Take 1,000 Units by mouth 2 (two) times daily.    . [employer] FIBER GUMMIES PO Take 1 tablet by mouth daily.    . cyclobenzaprine (FLEXERIL) 10 MG tablet Take 10 mg by mouth at bedtime.    . folic acid (FOLVITE) 1 MG tablet Take 1 mg by mouth daily.    Marland Kitchen gabapentin  (NEURONTIN) 300 MG capsule Take 300 mg by mouth 2 (two) times daily.    Marland Kitchen levothyroxine (SYNTHROID, LEVOTHROID) 50 MCG tablet Take 1 tablet by mouth daily.    Marland Kitchen lisinopril (PRINIVIL,ZESTRIL) 20 MG tablet Take 20 mg by mouth daily.    . Multiple Vitamins-Minerals (ZINC PO) Take by mouth.    . OMEPRAZOLE PO Take by mouth.    . venlafaxine (EFFEXOR) 75 MG tablet Take 75 mg by mouth 2 (two) times daily with a meal.     . ALPRAZolam (XANAX) 0.25 MG tablet Take 0.25 mg by mouth at bedtime as needed for anxiety. 0.5 tablet to 1 whole tablet every 12 hours as needed for anxiety (Patient not taking: No sig reported)    . triamcinolone (NASACORT) 55 MCG/ACT AERO nasal inhaler Place 1 spray into the nose as needed. (Patient not taking: Reported on 10/13/2021)     No current facility-administered medications for this visit.    PHYSICAL EXAMINATION: ECOG PERFORMANCE STATUS: 0 - Asymptomatic  BP 104/82 (BP Location: Left Arm, Patient Position: Sitting)   Pulse 86   Temp (!) 97.4 F (36.3 C) (Tympanic)   Resp 18   Wt 163 lb (73.9 kg)   SpO2 99%   BMI 29.81 kg/m   Filed Weights   10/13/21 1016  Weight: 163 lb (73.9 kg)    Physical Exam HENT:     Head: Normocephalic and atraumatic.     Mouth/Throat:     Pharynx: No oropharyngeal exudate.  Eyes:     Pupils: Pupils are equal, round, and reactive to light.  Cardiovascular:     Rate and Rhythm: Normal rate and regular rhythm.  Pulmonary:     Effort: No respiratory distress.     Breath sounds: No wheezing.  Abdominal:     General: Bowel sounds are normal. There is no distension.     Palpations: Abdomen is soft. There is no mass.     Tenderness: There is no abdominal tenderness. There is no guarding or rebound.  Musculoskeletal:        General: No tenderness. Normal range of motion.     Cervical back: Normal range of motion and neck supple.  Skin:    General: Skin is warm.  Neurological:     Mental Status: She is alert and oriented to  person, place, and time.  Psychiatric:        Mood and Affect: Affect normal.     LABORATORY DATA:  I have reviewed the data as listed    Component Value Date/Time   NA 135 10/13/2021 1000   NA 141 04/13/2018 1517   NA 142 05/19/2012 1957   K 4.0 10/13/2021 1000   K 4.6 05/19/2012 1957   CL 100 10/13/2021 1000   CL 108 (H) 05/19/2012 1957   CO2 25 10/13/2021 1000   CO2 26 05/19/2012 1957   GLUCOSE 80 10/13/2021 1000   GLUCOSE 94 05/19/2012 1957  BUN 39 (H) 10/13/2021 1000   BUN 34 (H) 04/13/2018 1517   BUN 10 05/19/2012 1957   CREATININE 1.65 (H) 10/13/2021 1000   CREATININE 1.09 05/19/2012 1957   CALCIUM 9.7 10/13/2021 1000   CALCIUM 9.0 05/19/2012 1957   PROT 7.4 05/11/2021 1109   PROT 7.3 04/13/2018 1517   PROT 6.2 (L) 05/19/2012 1957   ALBUMIN 4.4 05/11/2021 1109   ALBUMIN 4.9 (H) 04/13/2018 1517   ALBUMIN 3.4 05/19/2012 1957   AST 23 05/11/2021 1109   AST 31 05/19/2012 1957   ALT 20 05/11/2021 1109   ALT 28 05/19/2012 1957   ALKPHOS 57 05/11/2021 1109   ALKPHOS 61 05/19/2012 1957   BILITOT 0.5 05/11/2021 1109   BILITOT 0.3 04/13/2018 1517   BILITOT 0.3 05/19/2012 1957   GFRNONAA 34 (L) 10/13/2021 1000   GFRNONAA 56 (L) 05/19/2012 1957   GFRAA 58 (L) 04/27/2020 1035   GFRAA >60 05/19/2012 1957    No results found for: SPEP, UPEP  Lab Results  Component Value Date   WBC 4.1 10/13/2021   NEUTROABS 2.0 10/13/2021   HGB 12.2 10/13/2021   HCT 36.8 10/13/2021   MCV 97.6 10/13/2021   PLT 356 10/13/2021      Chemistry      Component Value Date/Time   NA 135 10/13/2021 1000   NA 141 04/13/2018 1517   NA 142 05/19/2012 1957   K 4.0 10/13/2021 1000   K 4.6 05/19/2012 1957   CL 100 10/13/2021 1000   CL 108 (H) 05/19/2012 1957   CO2 25 10/13/2021 1000   CO2 26 05/19/2012 1957   BUN 39 (H) 10/13/2021 1000   BUN 34 (H) 04/13/2018 1517   BUN 10 05/19/2012 1957   CREATININE 1.65 (H) 10/13/2021 1000   CREATININE 1.09 05/19/2012 1957      Component  Value Date/Time   CALCIUM 9.7 10/13/2021 1000   CALCIUM 9.0 05/19/2012 1957   ALKPHOS 57 05/11/2021 1109   ALKPHOS 61 05/19/2012 1957   AST 23 05/11/2021 1109   AST 31 05/19/2012 1957   ALT 20 05/11/2021 1109   ALT 28 05/19/2012 1957   BILITOT 0.5 05/11/2021 1109   BILITOT 0.3 04/13/2018 1517   BILITOT 0.3 05/19/2012 1957        ASSESSMENT & PLAN:   Normocytic anemia # worsening anemia-hemoglobin 8.3-9.0-likely secondary to CKD-III.   No evidence of iron deficiency-on Retacrit.  # Today hemoglobin is 12.1 -improved.  Hold Retacrit today.  STABLE; continue  EPO /Retacrit injections q 2 weeks basis; goal hemoglobin above 10.   #CKD-GFR in 34- creatinine 1.7 etiology unclear; October 2021 ultrasound kidneys negative; UA negative. ? NSIADs vs others.  No Kidney Bx- STABLE.   # Hereditary hemochromatosis- homozygous [as per patient]-without organ dysfunction; goal ferritin less than 100/saturation less than 50.  No phlebotomies/see above. STABLE>   # Complex ovarian cyst--improvement in the renal function/anemia- [ Dr. Ouida Sills for planning surgery on 11/21]-  once hemoglobin is above 10.   # DISPOSITION:  # HOLD RETACRIT  # every 2 weeks- H&H/possible retacrit x 6  # Follow up in third week of JAN 2023 MD; labs- cbc/bmp/possible retacrit-Dr.B.   Cc; Dr.Bronstein    Cammie Sickle, MD 10/13/2021 12:57 PM

## 2021-10-13 NOTE — Progress Notes (Signed)
Patient states that she has a cyst on her ovary that she will have to have surgery to remove.

## 2021-10-14 ENCOUNTER — Other Ambulatory Visit: Payer: Self-pay | Admitting: Obstetrics and Gynecology

## 2021-10-25 NOTE — H&P (Signed)
Ms. Raynor is a 65 y.o. female here for Discuss results .pt here to rediscuss her left ovarian cyst with hyperechoic area and an elevated HE-4 + FHX of ovarian cancer   Some intermittent bloating she associates with constipation    U/s today :  Uterus anteflexed   Endometrium=3.41mm   Rt ovary appears wnl Lt complex ovarian cyst with bright internal echo=0.72 x 0.69 x 0.74cm; echo=0.56cm No free fluid seen   Fibroid seen:anterior to endometrium=0.95cm               Past Medical History:  has a past medical history of Allergic state, Anxiety, Depression, Essential hypertension, benign, Hemochromatosis, Other and unspecified hyperlipidemia, Pure hypercholesterolemia, and Renal insufficiency.  Past Surgical History:  has a past surgical history that includes Tonsillectomy (1971); Cholecystectomy (05/2012); Hymenectomy (N/A, 1973); Tubal ligation (Bilateral, 1995); Unlisted Procedure Nose (N/A, 1975); and Colonoscopy (07/14/2011). Family History: family history includes Angina in her maternal grandmother; Asthma in her mother; Diabetes in her sister; Lung cancer in her father; Myocardial Infarction (Heart attack) in her maternal grandfather; Ovarian cancer (age of onset: 52) in her mother; Stroke in her paternal grandmother. Social History:  reports that she has never smoked. She has never used smokeless tobacco. She reports that she does not drink alcohol and does not use drugs. OB/GYN History:          OB History     Gravida  3   Para  3   Term  3   Preterm      AB      Living  3      SAB      IAB      Ectopic      Molar      Multiple      Live Births  3             Allergies: is allergic to sulfa (sulfonamide antibiotics) and norvasc [amlodipine]. Medications:   Current Outpatient Medications:    ALPRAZolam (XANAX) 0.25 MG tablet, TAKE 1/2 TO 1 TABLET BY MOUTH TWICE A DAY AS NEEDED. AVOID DAILY USE, Disp: 30 tablet, Rfl: 0   aspirin 81 MG chewable tablet,  Take 81 mg by mouth once daily., Disp: , Rfl:    cetirizine (ZYRTEC) 10 MG tablet, Take 10 mg by mouth once daily, Disp: , Rfl:    cholecalciferol (VITAMIN D3) 1000 unit tablet, Take 1,000 Units by mouth once daily., Disp: , Rfl:    fluocinonide (LIDEX) 0.05 % cream, APPLY SMALL AMOUNT TO AFFECTED AREAS, Disp: 30 g, Rfl: 0   folic acid (FOLVITE) 1 MG tablet, Take 1 mg by mouth once daily, Disp: , Rfl:    levothyroxine (SYNTHROID) 50 MCG tablet, TAKE 1 TABLET BY MOUTH DAILY - ON EMPTY STOMACH WITH WATER AT LEAST 30 MIN BEFORE BREAKFAST, Disp: 90 tablet, Rfl: 1   lisinopriL (ZESTRIL) 20 MG tablet, TAKE 1 TABLET BY MOUTH EVERY DAY, Disp: 90 tablet, Rfl: 1   melatonin 1 mg Chew, Take by mouth, Disp: , Rfl:    melatonin 2.5 mg Chew, Take by mouth, Disp: , Rfl:    multivitamin tablet, Take 1 tablet by mouth once daily, Disp: , Rfl:    triamcinolone (NASACORT AQ) 55 mcg nasal spray, Place 2 sprays into both nostrils once daily., Disp: , Rfl:    venlafaxine (EFFEXOR-XR) 75 MG XR capsule, TAKE 1-2 CAPSULES BY MOUTH ONCE DAILY, Disp: 180 capsule, Rfl: 1   zinc sulfate (ZINC-15 ORAL), Take by  mouth, Disp: , Rfl:    cyclobenzaprine (FLEXERIL) 10 MG tablet, TAKE 1 TABLET BY MOUTH EVERYDAY AT BEDTIME (Patient not taking: Reported on 10/11/2021), Disp: 30 tablet, Rfl: 1   gabapentin (NEURONTIN) 300 MG capsule, TAKE 1 CAPSULE BY MOUTH THREE TIMES A DAY (Patient not taking: Reported on 10/11/2021), Disp: 270 capsule, Rfl: 1   Review of Systems: General:                      No fatigue or weight loss Eyes:                           No vision changes Ears:                            No hearing difficulty Respiratory:                No cough or shortness of breath Pulmonary:                  No asthma or shortness of breath Cardiovascular:           No chest pain, palpitations, dyspnea on exertion Gastrointestinal:          No abdominal bloating, chronic diarrhea, constipations, masses, pain or  hematochezia Genitourinary:             No hematuria, dysuria, abnormal vaginal discharge, pelvic pain, Menometrorrhagia Lymphatic:                   No swollen lymph nodes Musculoskeletal:         No muscle weakness Neurologic:                  No extremity weakness, syncope, seizure disorder Psychiatric:                  No history of depression, delusions or suicidal/homicidal ideation      Exam:       Vitals:    10/11/21 1612  BP: 112/76  Pulse: 101      Body mass index is 29.81 kg/m.   WDWN white/ female in NAD   Lungs: CTA  CV : RRR without murmur   Abdomen: soft , no mass, normal active bowel sounds,  non-tender, no rebound tenderness Pelvic: deferred    Impression:    The encounter diagnosis was Complex ovarian cyst.       Plan:    After an additional thorough  discussion the patient has elected to undergo L/S BSO  Preop clearance completed by Dr Lovie Macadamia - cleared for surgery              Caroline Sauger, MD

## 2021-10-26 ENCOUNTER — Encounter
Admission: RE | Admit: 2021-10-26 | Discharge: 2021-10-26 | Disposition: A | Discharge status: Home / Self Care | Payer: Medicare Other | Source: Ambulatory Visit | Attending: Obstetrics and Gynecology | Admitting: Obstetrics and Gynecology

## 2021-10-26 ENCOUNTER — Other Ambulatory Visit
Admission: RE | Admit: 2021-10-26 | Discharge: 2021-10-26 | Disposition: A | Discharge status: Home / Self Care | Payer: Medicare Other | Source: Ambulatory Visit | Attending: Obstetrics and Gynecology | Admitting: Obstetrics and Gynecology

## 2021-10-26 ENCOUNTER — Other Ambulatory Visit: Payer: Self-pay

## 2021-10-26 DIAGNOSIS — I10 Essential (primary) hypertension: Secondary | ICD-10-CM | POA: Diagnosis not present

## 2021-10-26 DIAGNOSIS — Z01818 Encounter for other preprocedural examination: Secondary | ICD-10-CM | POA: Insufficient documentation

## 2021-10-26 HISTORY — DX: Chronic kidney disease, stage 3b: N18.32

## 2021-10-26 HISTORY — DX: Family history of other specified conditions: Z84.89

## 2021-10-26 HISTORY — DX: Hypothyroidism, unspecified: E03.9

## 2021-10-26 LAB — BASIC METABOLIC PANEL
Anion gap: 6 (ref 5–15)
BUN: 32 mg/dL — ABNORMAL HIGH (ref 8–23)
CO2: 26 mmol/L (ref 22–32)
Calcium: 9.6 mg/dL (ref 8.9–10.3)
Chloride: 105 mmol/L (ref 98–111)
Creatinine, Ser: 1.38 mg/dL — ABNORMAL HIGH (ref 0.44–1.00)
GFR, Estimated: 42 mL/min — ABNORMAL LOW (ref >60)
Glucose, Bld: 81 mg/dL (ref 70–99)
Potassium: 4.8 mmol/L (ref 3.5–5.1)
Sodium: 137 mmol/L (ref 135–145)

## 2021-10-26 LAB — CBC
HCT: 30.9 % — ABNORMAL LOW (ref 36.0–46.0)
Hemoglobin: 10.5 g/dL — ABNORMAL LOW (ref 12.0–15.0)
MCH: 32.2 pg (ref 26.0–34.0)
MCHC: 34 g/dL (ref 30.0–36.0)
MCV: 94.8 fL (ref 80.0–100.0)
Platelets: 224 10*3/uL (ref 150–400)
RBC: 3.26 MIL/uL — ABNORMAL LOW (ref 3.87–5.11)
RDW: 13.4 % (ref 11.5–15.5)
WBC: 3.4 10*3/uL — ABNORMAL LOW (ref 4.0–10.5)
nRBC: 0 % (ref 0.0–0.2)

## 2021-10-26 LAB — TYPE AND SCREEN
ABO/RH(D): A POS
Antibody Screen: NEGATIVE

## 2021-10-26 NOTE — Patient Instructions (Addendum)
Your procedure is scheduled on: Monday, November 21 Report to the Registration Desk on the 1st floor of the [facility]. To find out your arrival time, please call [phone] between 1PM - 3PM on: Friday, November 18  REMEMBER: Instructions that are not followed completely may result in serious medical risk, up to and including death; or upon the discretion of your surgeon and anesthesiologist your surgery may need to be rescheduled.  Do not eat food after midnight the night before surgery.  No gum chewing, lozengers or hard candies.  You may however, drink CLEAR liquids up to 2 hours before you are scheduled to arrive for your surgery. Do not drink anything within 2 hours of your scheduled arrival time.  Clear liquids include: - water  - apple juice without pulp - gatorade (not RED, PURPLE, OR BLUE) - black coffee or tea (Do NOT add milk or creamers to the coffee or tea) Do NOT drink anything that is not on this list.  In addition, your doctor has ordered for you to drink the provided  Ensure Pre-Surgery Clear Carbohydrate Drink  Drinking this carbohydrate drink up to two hours before surgery helps to reduce insulin resistance and improve patient outcomes. Please complete drinking 2 hours prior to scheduled arrival time.  TAKE THESE MEDICATIONS THE MORNING OF SURGERY WITH A SIP OF WATER:  Levothyroxine Omeprazole (Prilosec) - (take one the night before and one on the morning of surgery - helps to prevent nausea after surgery.) Venlafaxine (Effoxor)  One week prior to surgery: starting November 14 Stop aspirin and Anti-inflammatories (NSAIDS) such as Advil, Aleve, Ibuprofen, Motrin, Naproxen, Naprosyn and Aspirin based products such as Excedrin, Goodys Powder, BC Powder. Stop ANY OVER THE COUNTER supplements until after surgery. Stop folic acid, melatonin, zinc. You may however, continue to take Tylenol if needed for pain up until the day of surgery.  No Alcohol for 24 hours  before or after surgery.  No Smoking including e-cigarettes for 24 hours prior to surgery.  No chewable tobacco products for at least 6 hours prior to surgery.  No nicotine patches on the day of surgery.  Do not use any "recreational" drugs for at least a week prior to your surgery.  Please be advised that the combination of cocaine and anesthesia may have negative outcomes, up to and including death. If you test positive for cocaine, your surgery will be cancelled.  On the morning of surgery brush your teeth with toothpaste and water, you may rinse your mouth with mouthwash if you wish. Do not swallow any toothpaste or mouthwash.  Use CHG Soap as directed on instruction sheet.  Do not wear jewelry, make-up, hairpins, clips or nail polish.  Do not wear lotions, powders, or perfumes.   Do not shave body from the neck down 48 hours prior to surgery just in case you cut yourself which could leave a site for infection.  Also, freshly shaved skin may become irritated if using the CHG soap.  Contact lenses, hearing aids and dentures may not be worn into surgery.  Do not bring valuables to the hospital. [facility] is not responsible for any missing/lost belongings or valuables.   Notify your doctor if there is any change in your medical condition (cold, fever, infection).  Wear comfortable clothing (specific to your surgery type) to the hospital.  After surgery, you can help prevent lung complications by doing breathing exercises.  Take deep breaths and cough every 1-2 hours. Your doctor may order a  device called an Incentive Spirometer to help you take deep breaths. When coughing or sneezing, hold a pillow firmly against your incision with both hands. This is called "splinting." Doing this helps protect your incision. It also decreases belly discomfort.  If you are being discharged the day of surgery, you will not be allowed to drive home. You will need a responsible adult (18 years or  older) to drive you home and stay with you that night.   If you are taking public transportation, you will need to have a responsible adult (18 years or older) with you. Please confirm with your physician that it is acceptable to use public transportation.   Please call the [facility] at [phone] if you have any questions about these instructions.  Surgery Visitation Policy:  Patients undergoing a surgery or procedure may have one family member or support person with them as long as that person is not COVID-19 positive or experiencing its symptoms.  That person may remain in the waiting area during the procedure and may rotate out with other people.

## 2021-10-27 ENCOUNTER — Inpatient Hospital Stay: Payer: Medicare Other

## 2021-10-27 DIAGNOSIS — D649 Anemia, unspecified: Secondary | ICD-10-CM

## 2021-10-27 LAB — HEMOGLOBIN AND HEMATOCRIT, BLOOD
HCT: 31.2 % — ABNORMAL LOW (ref 36.0–46.0)
Hemoglobin: 10.5 g/dL — ABNORMAL LOW (ref 12.0–15.0)

## 2021-11-01 ENCOUNTER — Other Ambulatory Visit: Payer: Self-pay

## 2021-11-01 ENCOUNTER — Ambulatory Visit: Payer: Medicare Other | Admitting: Registered Nurse

## 2021-11-01 ENCOUNTER — Ambulatory Visit
Admission: RE | Admit: 2021-11-01 | Discharge: 2021-11-01 | Disposition: A | Discharge status: Home / Self Care | Payer: Medicare Other | Attending: Obstetrics and Gynecology | Admitting: Obstetrics and Gynecology

## 2021-11-01 ENCOUNTER — Encounter: Payer: Self-pay | Admitting: Obstetrics and Gynecology

## 2021-11-01 ENCOUNTER — Encounter
Admission: RE | Disposition: A | Discharge status: Home / Self Care | Payer: Self-pay | Source: Home / Self Care | Attending: Obstetrics and Gynecology

## 2021-11-01 DIAGNOSIS — I10 Essential (primary) hypertension: Secondary | ICD-10-CM | POA: Insufficient documentation

## 2021-11-01 DIAGNOSIS — N83202 Unspecified ovarian cyst, left side: Secondary | ICD-10-CM | POA: Insufficient documentation

## 2021-11-01 DIAGNOSIS — N1832 Chronic kidney disease, stage 3b: Secondary | ICD-10-CM | POA: Diagnosis not present

## 2021-11-01 DIAGNOSIS — I129 Hypertensive chronic kidney disease with stage 1 through stage 4 chronic kidney disease, or unspecified chronic kidney disease: Secondary | ICD-10-CM | POA: Insufficient documentation

## 2021-11-01 DIAGNOSIS — Z01818 Encounter for other preprocedural examination: Secondary | ICD-10-CM

## 2021-11-01 DIAGNOSIS — Z8041 Family history of malignant neoplasm of ovary: Secondary | ICD-10-CM | POA: Insufficient documentation

## 2021-11-01 HISTORY — PX: LAPAROSCOPIC BILATERAL SALPINGO OOPHERECTOMY: SHX5890

## 2021-11-01 LAB — ABO/RH: ABO/RH(D): A POS

## 2021-11-01 SURGERY — SALPINGO-OOPHORECTOMY, BILATERAL, LAPAROSCOPIC
Anesthesia: General | Laterality: Bilateral

## 2021-11-01 MED ORDER — GABAPENTIN 300 MG PO CAPS
ORAL_CAPSULE | ORAL | Status: AC
  Administered 2021-11-01: 300 mg via ORAL
  Filled 2021-11-01: qty 1

## 2021-11-01 MED ORDER — LACTATED RINGERS IV SOLN: INTRAVENOUS | Status: DC

## 2021-11-01 MED ORDER — OXYCODONE HCL 5 MG PO TABS
ORAL_TABLET | ORAL | Status: AC
  Administered 2021-11-01: 5 mg via ORAL
  Filled 2021-11-01: qty 1

## 2021-11-01 MED ORDER — OXYCODONE HCL 5 MG PO TABS: 5.0000 mg | ORAL_TABLET | Freq: Once | ORAL | Status: AC

## 2021-11-01 MED ORDER — 0.9 % SODIUM CHLORIDE (POUR BTL) OPTIME
TOPICAL | Status: DC | PRN
  Administered 2021-11-01: 400 mL

## 2021-11-01 MED ORDER — ONDANSETRON HCL 4 MG/2ML IJ SOLN
INTRAMUSCULAR | Status: DC | PRN
  Administered 2021-11-01: 4 mg via INTRAVENOUS

## 2021-11-01 MED ORDER — ONDANSETRON HCL 4 MG/2ML IJ SOLN: 4.0000 mg | Freq: Once | INTRAMUSCULAR | Status: DC | PRN

## 2021-11-01 MED ORDER — ACETAMINOPHEN 500 MG PO TABS: 1000.0000 mg | ORAL_TABLET | ORAL | Status: AC

## 2021-11-01 MED ORDER — MIDAZOLAM HCL 2 MG/2ML IJ SOLN
INTRAMUSCULAR | Status: AC
  Filled 2021-11-01: qty 2

## 2021-11-01 MED ORDER — FENTANYL CITRATE (PF) 100 MCG/2ML IJ SOLN: 25.0000 ug | INTRAMUSCULAR | Status: DC | PRN

## 2021-11-01 MED ORDER — CHLORHEXIDINE GLUCONATE 0.12 % MT SOLN
OROMUCOSAL | Status: AC
  Administered 2021-11-01: 15 mL via OROMUCOSAL
  Filled 2021-11-01: qty 15

## 2021-11-01 MED ORDER — DEXMEDETOMIDINE (PRECEDEX) IN NS 20 MCG/5ML (4 MCG/ML) IV SYRINGE
PREFILLED_SYRINGE | INTRAVENOUS | Status: DC | PRN
  Administered 2021-11-01 (×2): 10 ug via INTRAVENOUS

## 2021-11-01 MED ORDER — LIDOCAINE HCL (PF) 2 % IJ SOLN
INTRAMUSCULAR | Status: AC
  Filled 2021-11-01: qty 5

## 2021-11-01 MED ORDER — PROPOFOL 10 MG/ML IV BOLUS
INTRAVENOUS | Status: DC | PRN
  Administered 2021-11-01: 130 mg via INTRAVENOUS

## 2021-11-01 MED ORDER — FENTANYL CITRATE (PF) 100 MCG/2ML IJ SOLN
INTRAMUSCULAR | Status: DC | PRN
  Administered 2021-11-01 (×2): 50 ug via INTRAVENOUS

## 2021-11-01 MED ORDER — CHLORHEXIDINE GLUCONATE 0.12 % MT SOLN: 15.0000 mL | Freq: Once | OROMUCOSAL | Status: AC

## 2021-11-01 MED ORDER — LIDOCAINE HCL (CARDIAC) PF 100 MG/5ML IV SOSY
PREFILLED_SYRINGE | INTRAVENOUS | Status: DC | PRN
  Administered 2021-11-01: 60 mg via INTRAVENOUS

## 2021-11-01 MED ORDER — GABAPENTIN 300 MG PO CAPS: 300.0000 mg | ORAL_CAPSULE | ORAL | Status: AC

## 2021-11-01 MED ORDER — PROPOFOL 10 MG/ML IV BOLUS
INTRAVENOUS | Status: AC
  Filled 2021-11-01: qty 20

## 2021-11-01 MED ORDER — FENTANYL CITRATE (PF) 100 MCG/2ML IJ SOLN
INTRAMUSCULAR | Status: AC
  Filled 2021-11-01: qty 2

## 2021-11-01 MED ORDER — POVIDONE-IODINE 10 % EX SWAB: 2.0000 "application " | Freq: Once | CUTANEOUS | Status: DC

## 2021-11-01 MED ORDER — ROCURONIUM BROMIDE 100 MG/10ML IV SOLN
INTRAVENOUS | Status: DC | PRN
  Administered 2021-11-01: 50 mg via INTRAVENOUS

## 2021-11-01 MED ORDER — DEXAMETHASONE SODIUM PHOSPHATE 10 MG/ML IJ SOLN
INTRAMUSCULAR | Status: DC | PRN
  Administered 2021-11-01: 10 mg via INTRAVENOUS

## 2021-11-01 MED ORDER — SUGAMMADEX SODIUM 200 MG/2ML IV SOLN
INTRAVENOUS | Status: DC | PRN
  Administered 2021-11-01: 170 mg via INTRAVENOUS

## 2021-11-01 MED ORDER — ROCURONIUM BROMIDE 10 MG/ML (PF) SYRINGE
PREFILLED_SYRINGE | INTRAVENOUS | Status: AC
  Filled 2021-11-01: qty 10

## 2021-11-01 MED ORDER — HYDROCODONE-ACETAMINOPHEN 5-325 MG PO TABS: 1.0000 | ORAL_TABLET | ORAL | Status: DC | PRN

## 2021-11-01 MED ORDER — MIDAZOLAM HCL 2 MG/2ML IJ SOLN
INTRAMUSCULAR | Status: DC | PRN
  Administered 2021-11-01: 2 mg via INTRAVENOUS

## 2021-11-01 MED ORDER — ACETAMINOPHEN 500 MG PO TABS
ORAL_TABLET | ORAL | Status: AC
  Administered 2021-11-01: 1000 mg via ORAL
  Filled 2021-11-01: qty 2

## 2021-11-01 MED ORDER — BUPIVACAINE HCL 0.5 % IJ SOLN
INTRAMUSCULAR | Status: DC | PRN
  Administered 2021-11-01: 12 mL

## 2021-11-01 MED ORDER — ONDANSETRON HCL 4 MG/2ML IJ SOLN
INTRAMUSCULAR | Status: AC
  Filled 2021-11-01: qty 2

## 2021-11-01 MED ORDER — KETOROLAC TROMETHAMINE 30 MG/ML IJ SOLN
INTRAMUSCULAR | Status: DC | PRN
Start: 2021-11-01 — End: 2021-11-01
  Administered 2021-11-01: 15 mg via INTRAVENOUS

## 2021-11-01 MED ORDER — ORAL CARE MOUTH RINSE: 15.0000 mL | Freq: Once | OROMUCOSAL | Status: AC

## 2021-11-01 MED ORDER — KETOROLAC TROMETHAMINE 30 MG/ML IJ SOLN
INTRAMUSCULAR | Status: AC
  Filled 2021-11-01: qty 1

## 2021-11-01 MED ORDER — DEXMEDETOMIDINE (PRECEDEX) IN NS 20 MCG/5ML (4 MCG/ML) IV SYRINGE
PREFILLED_SYRINGE | INTRAVENOUS | Status: AC
  Filled 2021-11-01: qty 5

## 2021-11-01 MED ORDER — BUPIVACAINE HCL (PF) 0.5 % IJ SOLN
INTRAMUSCULAR | Status: AC
  Filled 2021-11-01: qty 30

## 2021-11-01 MED ORDER — ONDANSETRON HCL 4 MG PO TABS: 8.0000 mg | ORAL_TABLET | Freq: Once | ORAL | Status: DC

## 2021-11-01 MED ORDER — DEXAMETHASONE SODIUM PHOSPHATE 10 MG/ML IJ SOLN
INTRAMUSCULAR | Status: AC
  Filled 2021-11-01: qty 1

## 2021-11-01 SURGICAL SUPPLY — 48 items
APL PRP STRL LF DISP 70% ISPRP (MISCELLANEOUS) ×1
APL SRG 38 LTWT LNG FL B (MISCELLANEOUS)
APPLICATOR ARISTA FLEXITIP XL (MISCELLANEOUS) ×1 IMPLANT
BAG DRN RND TRDRP ANRFLXCHMBR (UROLOGICAL SUPPLIES) ×1
BAG SPEC RTRVL LRG 6X4 10 (ENDOMECHANICALS) ×1
BAG URINE DRAIN 2000ML AR STRL (UROLOGICAL SUPPLIES) ×2 IMPLANT
BLADE SURG SZ11 CARB STEEL (BLADE) ×2 IMPLANT
CATH ROBINSON RED A/P 16FR (CATHETERS) ×2 IMPLANT
CHLORAPREP W/TINT 26 (MISCELLANEOUS) ×2 IMPLANT
DRAPE UNDER BUTTOCK W/FLU (DRAPES) ×1 IMPLANT
DRSG TEGADERM 2-3/8X2-3/4 SM (GAUZE/BANDAGES/DRESSINGS) ×2 IMPLANT
GAUZE 4X4 16PLY ~~LOC~~+RFID DBL (SPONGE) ×2 IMPLANT
GLOVE SURG SYN 8.0 (GLOVE) ×2 IMPLANT
GLOVE SURG SYN 8.0 PF PI (GLOVE) ×1 IMPLANT
GOWN STRL REUS W/ TWL LRG LVL3 (GOWN DISPOSABLE) ×2 IMPLANT
GOWN STRL REUS W/ TWL XL LVL3 (GOWN DISPOSABLE) ×1 IMPLANT
GOWN STRL REUS W/TWL LRG LVL3 (GOWN DISPOSABLE) ×4
GOWN STRL REUS W/TWL XL LVL3 (GOWN DISPOSABLE) ×2
GRASPER SUT TROCAR 14GX15 (MISCELLANEOUS) ×2 IMPLANT
HEMOSTAT ARISTA ABSORB 3G PWDR (HEMOSTASIS) IMPLANT
IRRIGATION STRYKERFLOW (MISCELLANEOUS) ×1 IMPLANT
IRRIGATOR STRYKERFLOW (MISCELLANEOUS) ×2
IV NS 1000ML (IV SOLUTION) ×2
IV NS 1000ML BAXH (IV SOLUTION) ×1 IMPLANT
KIT PINK PAD W/HEAD ARE REST (MISCELLANEOUS) ×2
KIT PINK PAD W/HEAD ARM REST (MISCELLANEOUS) ×1 IMPLANT
KIT TURNOVER CYSTO (KITS) ×2 IMPLANT
LABEL OR SOLS (LABEL) ×2 IMPLANT
MANIFOLD NEPTUNE II (INSTRUMENTS) ×2 IMPLANT
NS IRRIG 500ML POUR BTL (IV SOLUTION) ×2 IMPLANT
PACK GYN LAPAROSCOPIC (MISCELLANEOUS) ×2 IMPLANT
PAD OB MATERNITY 4.3X12.25 (PERSONAL CARE ITEMS) ×2 IMPLANT
PAD PREP 24X41 OB/GYN DISP (PERSONAL CARE ITEMS) ×2 IMPLANT
POUCH SPECIMEN RETRIEVAL 10MM (ENDOMECHANICALS) ×2 IMPLANT
SCRUB EXIDINE 4% CHG 4OZ (MISCELLANEOUS) ×2 IMPLANT
SET TUBE SMOKE EVAC HIGH FLOW (TUBING) ×2 IMPLANT
SHEARS HARMONIC ACE PLUS 36CM (ENDOMECHANICALS) ×2 IMPLANT
SLEEVE ENDOPATH XCEL 5M (ENDOMECHANICALS) ×2 IMPLANT
SPONGE GAUZE 2X2 8PLY STRL LF (GAUZE/BANDAGES/DRESSINGS) ×2 IMPLANT
STRIP CLOSURE SKIN 1/4X4 (GAUZE/BANDAGES/DRESSINGS) ×2 IMPLANT
SUT VIC AB 0 CT1 36 (SUTURE) ×2 IMPLANT
SUT VIC AB 2-0 UR6 27 (SUTURE) ×2 IMPLANT
SUT VIC AB 4-0 SH 27 (SUTURE) ×2
SUT VIC AB 4-0 SH 27XANBCTRL (SUTURE) ×1 IMPLANT
SWABSTK COMLB BENZOIN TINCTURE (MISCELLANEOUS) ×2 IMPLANT
TROCAR ENDO BLADELESS 11MM (ENDOMECHANICALS) ×2 IMPLANT
TROCAR XCEL NON-BLD 5MMX100MML (ENDOMECHANICALS) ×2 IMPLANT
WATER STERILE IRR 500ML POUR (IV SOLUTION) ×2 IMPLANT

## 2021-11-01 NOTE — Anesthesia Procedure Notes (Signed)
Procedure Name: Intubation Date/Time: 11/01/2021 9:48 AM Performed by: Debe Coder, CRNA Pre-anesthesia Checklist: Patient identified, Emergency Drugs available, Suction available and Patient being monitored Patient Re-evaluated:Patient Re-evaluated prior to induction Oxygen Delivery Method: Circle system utilized Preoxygenation: Pre-oxygenation with 100% oxygen Induction Type: IV induction Ventilation: Mask ventilation without difficulty Laryngoscope Size: Mac and 3 Grade View: Grade I Tube type: Oral Tube size: 7.0 mm Number of attempts: 1 Airway Equipment and Method: Stylet and Oral airway Placement Confirmation: ETT inserted through vocal cords under direct vision, positive ETCO2 and breath sounds checked- equal and bilateral Secured at: 20 cm Tube secured with: Tape Dental Injury: Teeth and Oropharynx as per pre-operative assessment

## 2021-11-01 NOTE — Transfer of Care (Signed)
Immediate Anesthesia Transfer of Care Note  Patient: Katie Wise  Procedure(s) Performed: LAPAROSCOPIC BILATERAL SALPINGO OOPHORECTOMY (Bilateral)  Patient Location: PACU  Anesthesia Type:General  Level of Consciousness: awake, alert  and oriented  Airway & Oxygen Therapy: Patient Spontanous Breathing and Patient connected to face mask oxygen  Post-op Assessment: Report given to RN and Post -op Vital signs reviewed and stable  Post vital signs: Reviewed and stable  Last Vitals:  Vitals Value Taken Time  BP 100/58 11/01/21 1108  Temp    Pulse 61 11/01/21 1108  Resp 12 11/01/21 1108  SpO2 100 % 11/01/21 1108    Last Pain:  Vitals:   11/01/21 0829  TempSrc: Temporal  PainSc: 0-No pain         Complications: No notable events documented.

## 2021-11-01 NOTE — Progress Notes (Signed)
Pt here for L/S BSO  . LAbs reviewed . All questions answered  proceed

## 2021-11-01 NOTE — Discharge Instructions (Addendum)
AMBULATORY SURGERY  DISCHARGE INSTRUCTIONS   The drugs that you were given will stay in your system until tomorrow so for the next 24 hours you should not:  Drive an automobile Make any legal decisions Drink any alcoholic beverage   You may resume regular meals tomorrow.  Today it is better to start with liquids and gradually work up to solid foods.  You may eat anything you prefer, but it is better to start with liquids, then soup and crackers, and gradually work up to solid foods.   Please notify your doctor immediately if you have any unusual bleeding, trouble breathing, redness and pain at the surgery site, drainage, fever, or pain not relieved by medication.    Your post-operative visit with Dr.                                       is: Date:                        Time:    Please call to schedule your post-operative visit.  Additional Instructions: May take next pain medication today at 2:30pm

## 2021-11-01 NOTE — Anesthesia Postprocedure Evaluation (Signed)
Anesthesia Post Note  Patient: Katie Wise  Procedure(s) Performed: LAPAROSCOPIC BILATERAL SALPINGO OOPHORECTOMY (Bilateral)  Patient location during evaluation: PACU Anesthesia Type: General Level of consciousness: awake Pain management: pain level controlled Vital Signs Assessment: post-procedure vital signs reviewed and stable Respiratory status: spontaneous breathing and respiratory function stable Cardiovascular status: blood pressure returned to baseline Anesthetic complications: no   No notable events documented.   Last Vitals:  Vitals:   11/01/21 1115 11/01/21 1130  BP: (!) 97/59 110/62  Pulse: 65 67  Resp: 19 17  Temp: 36.6 C (!) 36.4 C  SpO2: 100% 94%    Last Pain:  Vitals:   11/01/21 1130  TempSrc:   PainSc: 2                  VAN STAVEREN,Davionne Mastrangelo

## 2021-11-01 NOTE — Op Note (Signed)
Katie Wise, [place] MEDICAL RECORD NO: [id number] ACCOUNT NO: [id number] DATE OF BIRTH: January 23, 1956 FACILITY: [facility] LOCATION: [facility] PHYSICIAN: Boykin Nearing, MD  Operative Report   DATE OF PROCEDURE: 11/01/2021  PREOPERATIVE DIAGNOSES: 1.  Complex left ovarian cyst. 2.  Elevated HE4 level.  POSTOPERATIVE DIAGNOSES: 1.  Complex left ovarian cyst. 2.  Elevated HE4 level.  PROCEDURE: 1.  Laparoscopic bilateral salpingo-oophorectomy. 2.  Pelvic cell washings.  SURGEON:  Boykin Nearing, MD  FIRST ASSISTANTLeafy Ro, MD  ANESTHESIA:  General endotracheal anesthesia.  INDICATIONS:  A 65 year old gravida 2, para 2 patient noted to have a complex left ovarian cyst with a 7 x 7 mm hyperechoic nidus within.  HE4 levels mildly elevated.  The patient has a family history with her mother having ovarian cancer.  DESCRIPTION OF PROCEDURE:  After adequate general endotracheal anesthesia, the patient was placed in the dorsal supine position with legs in the [place] stirrups.  The patient's abdomen, perineum and vagina were prepped and draped in normal sterile  fashion.  Timeout was performed.  Straight catheterization of the bladder yielded 100 mL urine.  Sponge stick was placed in the vagina for uterine manipulation.  Gloves and gown were changed.  Attention was directed to the patient's abdomen where a 5 mm  infraumbilical incision was made after injecting with Marcaine.  A 5 mm laparoscope was advanced into the abdominal cavity under direct visualization.  A second port placement was placed right lower quadrant 3 cm medial to the superior anterior iliac spine  and a third port placement was placed in the left lower quadrant 11 mm trocar was advanced a 3 cm medial to the left anterior iliac spine under direct visualization the trocar was placed.  Initial impression revealed normal-appearing ovaries for menopausal female.  There were no excrescences from either ovary.   Anterior and posterior cul-de-sac appeared normal as well as the uterus.  Pelvic cell washings were performed.  Ureters were  identified bilaterally with normal peristaltic activity.  The infundibulopelvic ligaments were bilaterally cauterized and transected.  Each ovary was removed without difficulty.  Good hemostasis was noted.  Harmonic scalpel was used for the dissection.   Each fallopian tube and ovary were removed separately and will be sent separately for identification and evaluation.  Intraabdominal pressure was lowered to 7 mmHg and good hemostasis was noted.  The left lower port placement was then closed with the  Carter-Thomason/PMI needle with good closure of the fascia.  The patient's abdomen was deflated and all incisions were closed with interrupted 4-0 Vicryl suture.  The sponge stick was removed.  There were no complications.  INTRAOPERATIVE FLUIDS:  700 mL.  ESTIMATED BLOOD LOSS:  5 mL  URINE OUTPUT:  100 mL.  The patient tolerated the procedure well and was taken to recovery room in good condition.   PUS D: 11/01/2021 11:24:22 am T: 11/01/2021 1:13:00 pm  JOB: [id number]/ [id number]

## 2021-11-01 NOTE — Anesthesia Preprocedure Evaluation (Signed)
Anesthesia Evaluation  Patient identified by MRN, date of birth, ID band Patient awake    Reviewed: Allergy & Precautions, NPO status , Patient's Chart, lab work & pertinent test results  Airway Mallampati: I  TM Distance: >3 FB Neck ROM: full    Dental  (+) Teeth Intact   Pulmonary neg pulmonary ROS,    Pulmonary exam normal breath sounds clear to auscultation       Cardiovascular Exercise Tolerance: Good hypertension, Pt. on medications negative cardio ROS Normal cardiovascular exam Rhythm:Regular Rate:Normal     Neuro/Psych  Headaches, Anxiety CVA negative neurological ROS  negative psych ROS   GI/Hepatic negative GI ROS, Neg liver ROS,   Endo/Other  negative endocrine ROSHypothyroidism   Renal/GU      Musculoskeletal negative musculoskeletal ROS (+)   Abdominal Normal abdominal exam  (+)   Peds negative pediatric ROS (+)  Hematology negative hematology ROS (+) Blood dyscrasia, anemia ,   Anesthesia Other Findings Past Medical History: No date: Abnormal CT scan, sinus     Comment:  VASCULAR ABNORMALITY No date: Anemia No date: Anxiety No date: Back pain 8+ years: Breast cyst     Comment:   HAD SURGICAL F/U WITH DR Pat Patrick  No date: Chronic kidney disease, stage 3b (HCC) No date: Elevated ferritin level No date: Family history of adverse reaction to anesthesia     Comment:  mother N/V No date: Hemochromatosis     Comment:  Homozygous on genetic testing March 2002 No date: Hypertension No date: Hypothyroidism  Past Surgical History: 01/26/2011: BONE MARROW BIOPSY     Comment:  FLOW SHOWS POSITIVE FOR LGL'S, FISH IS NEG, 2011: BREAST EXCISIONAL BIOPSY; Right     Comment:  Negative 2013: CHOLECYSTECTOMY 07/2011: COLONOSCOPY 07/2011: ESOPHAGOGASTRODUODENOSCOPY 1973: HYMENECTOMY No date: NASAL SEPTUM SURGERY 1971: TONSILLECTOMY 1995: TUBAL LIGATION  BMI    Body Mass Index: 29.26 kg/m       Reproductive/Obstetrics negative OB ROS                             Anesthesia Physical Anesthesia Plan  ASA: 2  Anesthesia Plan: General   Post-op Pain Management:    Induction: Intravenous  PONV Risk Score and Plan: 1 and Ondansetron  Airway Management Planned: Oral ETT  Additional Equipment:   Intra-op Plan:   Post-operative Plan: Extubation in OR  Informed Consent: I have reviewed the patients History and Physical, chart, labs and discussed the procedure including the risks, benefits and alternatives for the proposed anesthesia with the patient or authorized representative who has indicated his/her understanding and acceptance.     Dental Advisory Given  Plan Discussed with: CRNA and Surgeon  Anesthesia Plan Comments:         Anesthesia Quick Evaluation

## 2021-11-01 NOTE — Brief Op Note (Signed)
11/01/2021  10:51 AM  PATIENT:  Katie Wise  65 y.o. female  PRE-OPERATIVE DIAGNOSIS:  complex left ovarian cyst, elevated HE4  POST-OPERATIVE DIAGNOSIS:  complex left ovarian cyst, elevated HE4  PROCEDURE:  Procedure(s): LAPAROSCOPIC BILATERAL SALPINGO OOPHORECTOMY (Bilateral) Pelvic floor washing  SURGEON:  Surgeon(s) and Role:    * Kimisha Eunice, Gwen Her, MD - Primary    * Benjaman Kindler, MD - Assisting  PHYSICIAN ASSISTANT:   ASSISTANTS: none   ANESTHESIA:   general  EBL:  5 mL IOF 700 cc UO 100 cc  BLOOD ADMINISTERED:none  DRAINS: none   LOCAL MEDICATIONS USED:  marcaine  SPECIMEN:  Source of Specimen:  bilateral fallopian tubes and ovaries  Pelvic floor washings  DISPOSITION OF SPECIMEN:  PATHOLOGY  COUNTS:  YES  TOURNIQUET:  * No tourniquets in log *  DICTATION: .Other Dictation: Dictation Number verbal  PLAN OF CARE: Discharge to home after PACU  PATIENT DISPOSITION:  PACU - hemodynamically stable.   Delay start of Pharmacological VTE agent (>24hrs) due to surgical blood loss or risk of bleeding: not applicable

## 2021-11-02 ENCOUNTER — Encounter: Payer: Self-pay | Admitting: Obstetrics and Gynecology

## 2021-11-02 LAB — SURGICAL PATHOLOGY

## 2021-11-02 LAB — CYTOLOGY - NON PAP

## 2021-11-10 ENCOUNTER — Other Ambulatory Visit: Payer: Self-pay

## 2021-11-10 ENCOUNTER — Inpatient Hospital Stay: Payer: Medicare Other

## 2021-11-10 DIAGNOSIS — D649 Anemia, unspecified: Secondary | ICD-10-CM

## 2021-11-10 LAB — HEMOGLOBIN AND HEMATOCRIT, BLOOD
HCT: 32.1 % — ABNORMAL LOW (ref 36.0–46.0)
Hemoglobin: 10.8 g/dL — ABNORMAL LOW (ref 12.0–15.0)

## 2021-11-16 DIAGNOSIS — N289 Disorder of kidney and ureter, unspecified: Secondary | ICD-10-CM | POA: Insufficient documentation

## 2021-11-24 ENCOUNTER — Inpatient Hospital Stay: Payer: Medicare Other | Attending: Internal Medicine

## 2021-11-24 ENCOUNTER — Other Ambulatory Visit: Payer: Self-pay

## 2021-11-24 DIAGNOSIS — D649 Anemia, unspecified: Secondary | ICD-10-CM

## 2021-11-24 LAB — HEMOGLOBIN AND HEMATOCRIT, BLOOD
HCT: 30.5 % — ABNORMAL LOW (ref 36.0–46.0)
Hemoglobin: 10.4 g/dL — ABNORMAL LOW (ref 12.0–15.0)

## 2021-12-08 ENCOUNTER — Other Ambulatory Visit: Payer: Self-pay

## 2021-12-08 ENCOUNTER — Inpatient Hospital Stay: Payer: Medicare Other

## 2021-12-08 DIAGNOSIS — D649 Anemia, unspecified: Secondary | ICD-10-CM

## 2021-12-08 LAB — HEMOGLOBIN AND HEMATOCRIT, BLOOD
HCT: 31.6 % — ABNORMAL LOW (ref 36.0–46.0)
Hemoglobin: 10.7 g/dL — ABNORMAL LOW (ref 12.0–15.0)

## 2021-12-15 ENCOUNTER — Encounter: Payer: Self-pay | Admitting: Internal Medicine

## 2021-12-16 DIAGNOSIS — I10 Essential (primary) hypertension: Secondary | ICD-10-CM | POA: Diagnosis not present

## 2021-12-16 DIAGNOSIS — N1832 Chronic kidney disease, stage 3b: Secondary | ICD-10-CM | POA: Diagnosis not present

## 2021-12-16 DIAGNOSIS — D631 Anemia in chronic kidney disease: Secondary | ICD-10-CM | POA: Diagnosis not present

## 2021-12-16 DIAGNOSIS — N2581 Secondary hyperparathyroidism of renal origin: Secondary | ICD-10-CM | POA: Diagnosis not present

## 2021-12-22 ENCOUNTER — Inpatient Hospital Stay: Payer: Medicare HMO

## 2021-12-22 ENCOUNTER — Inpatient Hospital Stay: Payer: Medicare HMO | Attending: Internal Medicine

## 2021-12-22 ENCOUNTER — Other Ambulatory Visit: Payer: Self-pay

## 2021-12-22 DIAGNOSIS — N1832 Chronic kidney disease, stage 3b: Secondary | ICD-10-CM | POA: Insufficient documentation

## 2021-12-22 DIAGNOSIS — D649 Anemia, unspecified: Secondary | ICD-10-CM | POA: Insufficient documentation

## 2021-12-22 LAB — HEMOGLOBIN AND HEMATOCRIT, BLOOD
HCT: 32.2 % — ABNORMAL LOW (ref 36.0–46.0)
Hemoglobin: 11.1 g/dL — ABNORMAL LOW (ref 12.0–15.0)

## 2021-12-24 ENCOUNTER — Encounter: Payer: Self-pay | Admitting: Internal Medicine

## 2021-12-24 NOTE — Telephone Encounter (Signed)
error 

## 2021-12-29 ENCOUNTER — Inpatient Hospital Stay: Payer: Medicare HMO

## 2021-12-29 ENCOUNTER — Other Ambulatory Visit: Payer: Self-pay

## 2021-12-29 ENCOUNTER — Encounter: Payer: Self-pay | Admitting: Internal Medicine

## 2021-12-29 ENCOUNTER — Inpatient Hospital Stay (HOSPITAL_BASED_OUTPATIENT_CLINIC_OR_DEPARTMENT_OTHER): Payer: Medicare HMO | Admitting: Internal Medicine

## 2021-12-29 VITALS — BP 96/60 | HR 74 | Temp 99.0°F | Ht 62.0 in | Wt 165.2 lb

## 2021-12-29 DIAGNOSIS — D649 Anemia, unspecified: Secondary | ICD-10-CM

## 2021-12-29 DIAGNOSIS — N1832 Chronic kidney disease, stage 3b: Secondary | ICD-10-CM | POA: Diagnosis not present

## 2021-12-29 LAB — CBC WITH DIFFERENTIAL/PLATELET
Abs Immature Granulocytes: 0.02 10*3/uL (ref 0.00–0.07)
Basophils Absolute: 0 10*3/uL (ref 0.0–0.1)
Basophils Relative: 1 %
Eosinophils Absolute: 0.2 10*3/uL (ref 0.0–0.5)
Eosinophils Relative: 4 %
HCT: 30.7 % — ABNORMAL LOW (ref 36.0–46.0)
Hemoglobin: 10.6 g/dL — ABNORMAL LOW (ref 12.0–15.0)
Immature Granulocytes: 1 %
Lymphocytes Relative: 36 %
Lymphs Abs: 1.3 10*3/uL (ref 0.7–4.0)
MCH: 32.9 pg (ref 26.0–34.0)
MCHC: 34.5 g/dL (ref 30.0–36.0)
MCV: 95.3 fL (ref 80.0–100.0)
Monocytes Absolute: 0.4 10*3/uL (ref 0.1–1.0)
Monocytes Relative: 12 %
Neutro Abs: 1.7 10*3/uL (ref 1.7–7.7)
Neutrophils Relative %: 46 %
Platelets: 229 10*3/uL (ref 150–400)
RBC: 3.22 MIL/uL — ABNORMAL LOW (ref 3.87–5.11)
RDW: 12.7 % (ref 11.5–15.5)
WBC: 3.6 10*3/uL — ABNORMAL LOW (ref 4.0–10.5)
nRBC: 0 % (ref 0.0–0.2)

## 2021-12-29 LAB — BASIC METABOLIC PANEL
Anion gap: 7 (ref 5–15)
BUN: 31 mg/dL — ABNORMAL HIGH (ref 8–23)
CO2: 26 mmol/L (ref 22–32)
Calcium: 9.7 mg/dL (ref 8.9–10.3)
Chloride: 100 mmol/L (ref 98–111)
Creatinine, Ser: 1.5 mg/dL — ABNORMAL HIGH (ref 0.44–1.00)
GFR, Estimated: 38 mL/min — ABNORMAL LOW (ref >60)
Glucose, Bld: 82 mg/dL (ref 70–99)
Potassium: 4.6 mmol/L (ref 3.5–5.1)
Sodium: 133 mmol/L — ABNORMAL LOW (ref 135–145)

## 2021-12-29 NOTE — Assessment & Plan Note (Signed)
#   worsening anemia-hemoglobin 8.3-9.0-likely secondary to CKD-III.   No evidence of iron deficiency-on Retacrit.  # Today hemoglobin is 10.6- improved.  Hold Retacrit today.  STABLE; continue Retacrit every 3 months.  Not on oral iron/hemochromatosis.  Recheck iron levels at next visit  #CKD-GFR in 34- creatinine 1.5 etiology unclear; October 2021 ultrasound kidneys negative; UA negative. ? NSIADs vs others.  No Kidney Bx- STABLE [Dr.Lateef].   # Hereditary hemochromatosis- homozygous [as per patient]-without organ dysfunction-especially with the ongoing anemia.  Monitor for now  #Right complex ovarian cyst--s/p surgery-no evidence of any malignancy.  [ Dr. Ouida Sills- surgery NOV 2022].   # DISPOSITION:  # HOLD RETACRIT  # Follow up in [facility] mid MD; labs- cbc/bmp/iron studies/ferritin-possible retacrit-Dr.B.   Cc; Dr.Bronstein

## 2021-12-29 NOTE — Progress Notes (Signed)
Katie Wise OFFICE PROGRESS NOTE  Patient Care Team: [name], MD as PCP - General (Family Medicine) Katie Legato, MD as Consulting Physician (Nephrology) Katie Sickle, MD as Consulting Physician (Internal Medicine)   SUMMARY OF HEMATOLOGIC-ONCOLOGIC HISTORY:  # 2002- HEREDITARY HEMOCHROMATOSIS HOMOZYGOUS [goal ferritin 100-150]    # 2012- BMbx [sec to Anemia]-Normo-cellular; T Large Granular Lymphocytes by flowcytometry [4.5%] primary vs Reactive; FISH MDS- NEG  # Hx of Stroke [2012; no deficits]; CKD stage III [Dr.Lateef; ? Kidney Bx]     INTERVAL HISTORY: Alone.  Ambulating independently.  66 year old female patient with a prior history of homozygous hereditary hemochromatosis; recently diagnosed chronic kidney disease/anemia is here for follow-up.  In the interim patient was evaluated by Dr. Audry Pili surgery.  Postoperatively no complications.  Otherwise chronic mild fatigue no worsening shortness of breath or cough.  No blood in stools or black or stools.   Review of Systems  Constitutional:  Positive for malaise/fatigue. Negative for chills, diaphoresis, fever and weight loss.  HENT:  Negative for nosebleeds and sore throat.   Eyes:  Negative for double vision.  Respiratory:  Negative for cough, hemoptysis, sputum production, shortness of breath and wheezing.   Cardiovascular:  Negative for chest pain, palpitations, orthopnea and leg swelling.  Gastrointestinal:  Negative for abdominal pain, blood in stool, constipation, diarrhea, heartburn, melena, nausea and vomiting.  Genitourinary:  Negative for dysuria, frequency and urgency.  Musculoskeletal:  Positive for back pain. Negative for joint pain.  Skin: Negative.  Negative for itching and rash.  Neurological:  Negative for dizziness, tingling, focal weakness, weakness and headaches.  Endo/Heme/Allergies:  Does not bruise/bleed easily.  Psychiatric/Behavioral:  Negative for  depression. The patient is not nervous/anxious and does not have insomnia.      PAST MEDICAL HISTORY :  Past Medical History:  Diagnosis Date   Abnormal CT scan, sinus    VASCULAR ABNORMALITY   Anemia    Anxiety    Back pain    Breast cyst 8+ years    HAD SURGICAL F/U WITH DR ELY    Chronic kidney disease, stage 3b (HCC)    Elevated ferritin level    Family history of adverse reaction to anesthesia    mother N/V   Hemochromatosis    Homozygous on genetic testing March 2002   Hypertension    Hypothyroidism     PAST SURGICAL HISTORY :   Past Surgical History:  Procedure Laterality Date   BONE MARROW BIOPSY  01/26/2011   FLOW SHOWS POSITIVE FOR LGL'S, FISH IS NEG,   BREAST EXCISIONAL BIOPSY Right 2011   Negative   CHOLECYSTECTOMY  2013   COLONOSCOPY  07/2011   ESOPHAGOGASTRODUODENOSCOPY  07/2011   HYMENECTOMY  1973   LAPAROSCOPIC BILATERAL SALPINGO OOPHERECTOMY Bilateral 11/01/2021   Procedure: LAPAROSCOPIC BILATERAL SALPINGO OOPHORECTOMY;  Surgeon: Boykin Nearing, MD;  Location: [facility];  Service: Gynecology;  Laterality: Bilateral;   NASAL SEPTUM SURGERY     TONSILLECTOMY  1971   TUBAL LIGATION  1995    FAMILY HISTORY :   Family History  Problem Relation Age of Onset   Ovarian cancer Mother        BRCA status reported negative   Cancer Mother    Heart disease Mother    Cancer - Ovarian Mother    Cancer Father    Lung cancer Father    Breast cancer Paternal Aunt     SOCIAL HISTORY:   Social History   Tobacco Use  Smoking status: Never   Smokeless tobacco: Never  Vaping Use   Vaping Use: Never used  Substance Use Topics   Alcohol use: Yes    Comment: occassional wine   Drug use: No    ALLERGIES:  is allergic to amlodipine, hydromorphone, and sulfa antibiotics.  MEDICATIONS:  Current Outpatient Medications  Medication Sig Dispense Refill   ALPRAZolam (XANAX) 0.25 MG tablet Take 0.25 mg by mouth daily as needed for anxiety.     aspirin  81 MG chewable tablet Chew 81 mg by mouth daily.     cetirizine (ZYRTEC) 10 MG tablet Take 10 mg by mouth daily.     cholecalciferol (VITAMIN D3) 25 MCG (1000 UNIT) tablet Take 1,000 Units by mouth in the morning and at bedtime.     cyclobenzaprine (FLEXERIL) 10 MG tablet Take 10 mg by mouth at bedtime.     diclofenac Sodium (VOLTAREN) 1 % GEL Apply 1 application topically daily as needed (pain).     folic acid (FOLVITE) 1 MG tablet Take 1 mg by mouth daily.     levothyroxine (SYNTHROID, LEVOTHROID) 50 MCG tablet Take 50 mcg by mouth daily before breakfast.     lisinopril (PRINIVIL,ZESTRIL) 20 MG tablet Take 20 mg by mouth daily.     omeprazole (PRILOSEC) 20 MG capsule Take 20 mg by mouth daily as needed (acid reflux).     zinc gluconate 50 MG tablet Take 50 mg by mouth daily.     gabapentin (NEURONTIN) 300 MG capsule Take 300 mg by mouth 2 (two) times daily as needed (pain). (Patient not taking: Reported on 12/29/2021)     Melatonin 5 MG CHEW Chew 10 mg by mouth at bedtime as needed (sleep). (Patient not taking: Reported on 12/29/2021)     venlafaxine XR (EFFEXOR-XR) 75 MG 24 hr capsule Take 75 mg by mouth in the morning and at bedtime. (Patient not taking: Reported on 12/29/2021)     No current facility-administered medications for this visit.    PHYSICAL EXAMINATION: ECOG PERFORMANCE STATUS: 0 - Asymptomatic  BP 96/60 (BP Location: Left Arm, Patient Position: Sitting, Cuff Size: Normal)    Pulse 74    Temp 99 F (37.2 C) (Tympanic)    Ht '5\' 2"'  (1.575 m)    Wt 165 lb 3.2 oz (74.9 kg)    SpO2 100%    BMI 30.22 kg/m   Filed Weights   12/29/21 1504  Weight: 165 lb 3.2 oz (74.9 kg)    Physical Exam HENT:     Head: Normocephalic and atraumatic.     Mouth/Throat:     Pharynx: No oropharyngeal exudate.  Eyes:     Pupils: Pupils are equal, round, and reactive to light.  Cardiovascular:     Rate and Rhythm: Normal rate and regular rhythm.  Pulmonary:     Effort: No respiratory distress.      Breath sounds: No wheezing.  Abdominal:     General: Bowel sounds are normal. There is no distension.     Palpations: Abdomen is soft. There is no mass.     Tenderness: There is no abdominal tenderness. There is no guarding or rebound.  Musculoskeletal:        General: No tenderness. Normal range of motion.     Cervical back: Normal range of motion and neck supple.  Skin:    General: Skin is warm.  Neurological:     Mental Status: She is alert and oriented to person, place, and time.  Psychiatric:  Mood and Affect: Affect normal.     LABORATORY DATA:  I have reviewed the data as listed    Component Value Date/Time   NA 133 (L) 12/29/2021 1438   NA 141 04/13/2018 1517   NA 142 05/19/2012 1957   K 4.6 12/29/2021 1438   K 4.6 05/19/2012 1957   CL 100 12/29/2021 1438   CL 108 (H) 05/19/2012 1957   CO2 26 12/29/2021 1438   CO2 26 05/19/2012 1957   GLUCOSE 82 12/29/2021 1438   GLUCOSE 94 05/19/2012 1957   BUN 31 (H) 12/29/2021 1438   BUN 34 (H) 04/13/2018 1517   BUN 10 05/19/2012 1957   CREATININE 1.50 (H) 12/29/2021 1438   CREATININE 1.09 05/19/2012 1957   CALCIUM 9.7 12/29/2021 1438   CALCIUM 9.0 05/19/2012 1957   PROT 7.4 05/11/2021 1109   PROT 7.3 04/13/2018 1517   PROT 6.2 (L) 05/19/2012 1957   ALBUMIN 4.4 05/11/2021 1109   ALBUMIN 4.9 (H) 04/13/2018 1517   ALBUMIN 3.4 05/19/2012 1957   AST 23 05/11/2021 1109   AST 31 05/19/2012 1957   ALT 20 05/11/2021 1109   ALT 28 05/19/2012 1957   ALKPHOS 57 05/11/2021 1109   ALKPHOS 61 05/19/2012 1957   BILITOT 0.5 05/11/2021 1109   BILITOT 0.3 04/13/2018 1517   BILITOT 0.3 05/19/2012 1957   GFRNONAA 38 (L) 12/29/2021 1438   GFRNONAA 56 (L) 05/19/2012 1957   GFRAA 58 (L) 04/27/2020 1035   GFRAA >60 05/19/2012 1957    No results found for: SPEP, UPEP  Lab Results  Component Value Date   WBC 3.6 (L) 12/29/2021   NEUTROABS 1.7 12/29/2021   HGB 10.6 (L) 12/29/2021   HCT 30.7 (L) 12/29/2021   MCV 95.3  12/29/2021   PLT 229 12/29/2021      Chemistry      Component Value Date/Time   NA 133 (L) 12/29/2021 1438   NA 141 04/13/2018 1517   NA 142 05/19/2012 1957   K 4.6 12/29/2021 1438   K 4.6 05/19/2012 1957   CL 100 12/29/2021 1438   CL 108 (H) 05/19/2012 1957   CO2 26 12/29/2021 1438   CO2 26 05/19/2012 1957   BUN 31 (H) 12/29/2021 1438   BUN 34 (H) 04/13/2018 1517   BUN 10 05/19/2012 1957   CREATININE 1.50 (H) 12/29/2021 1438   CREATININE 1.09 05/19/2012 1957      Component Value Date/Time   CALCIUM 9.7 12/29/2021 1438   CALCIUM 9.0 05/19/2012 1957   ALKPHOS 57 05/11/2021 1109   ALKPHOS 61 05/19/2012 1957   AST 23 05/11/2021 1109   AST 31 05/19/2012 1957   ALT 20 05/11/2021 1109   ALT 28 05/19/2012 1957   BILITOT 0.5 05/11/2021 1109   BILITOT 0.3 04/13/2018 1517   BILITOT 0.3 05/19/2012 1957        ASSESSMENT & PLAN:   Normocytic anemia # worsening anemia-hemoglobin 8.3-9.0-likely secondary to CKD-III.   No evidence of iron deficiency-on Retacrit.  # Today hemoglobin is 10.6- improved.  Hold Retacrit today.  STABLE; continue Retacrit every 3 months.  Not on oral iron/hemochromatosis.  Recheck iron levels at next visit  #CKD-GFR in 34- creatinine 1.5 etiology unclear; October 2021 ultrasound kidneys negative; UA negative. ? NSIADs vs others.  No Kidney Bx- STABLE [Dr.Lateef].   # Hereditary hemochromatosis- homozygous [as per patient]-without organ dysfunction-especially with the ongoing anemia.  Monitor for now  #Right complex ovarian cyst--s/p surgery-no evidence of any malignancy.  [ Dr.  Schermerhorn- surgery NOV 2022].   # DISPOSITION:  # HOLD RETACRIT  # Follow up in [facility] mid MD; labs- cbc/bmp/iron studies/ferritin-possible retacrit-Dr.B.   Cc; Dr.Bronstein    Katie Sickle, MD 12/29/2021 3:28 PM

## 2022-01-12 ENCOUNTER — Ambulatory Visit: Payer: Medicare Other

## 2022-01-12 ENCOUNTER — Other Ambulatory Visit: Payer: Medicare Other

## 2022-03-04 DIAGNOSIS — Z13220 Encounter for screening for lipoid disorders: Secondary | ICD-10-CM | POA: Diagnosis not present

## 2022-03-04 DIAGNOSIS — E039 Hypothyroidism, unspecified: Secondary | ICD-10-CM | POA: Diagnosis not present

## 2022-03-28 DIAGNOSIS — K219 Gastro-esophageal reflux disease without esophagitis: Secondary | ICD-10-CM | POA: Diagnosis not present

## 2022-03-28 DIAGNOSIS — D649 Anemia, unspecified: Secondary | ICD-10-CM | POA: Diagnosis not present

## 2022-03-28 DIAGNOSIS — Z1211 Encounter for screening for malignant neoplasm of colon: Secondary | ICD-10-CM | POA: Diagnosis not present

## 2022-03-29 ENCOUNTER — Inpatient Hospital Stay: Payer: Medicare HMO

## 2022-03-29 ENCOUNTER — Inpatient Hospital Stay: Payer: Medicare HMO | Attending: Internal Medicine

## 2022-03-29 ENCOUNTER — Inpatient Hospital Stay: Payer: Medicare HMO | Admitting: Oncology

## 2022-03-29 VITALS — BP 98/69 | HR 72 | Temp 98.0°F | Resp 18 | Wt 165.0 lb

## 2022-03-29 DIAGNOSIS — R5383 Other fatigue: Secondary | ICD-10-CM | POA: Insufficient documentation

## 2022-03-29 DIAGNOSIS — I129 Hypertensive chronic kidney disease with stage 1 through stage 4 chronic kidney disease, or unspecified chronic kidney disease: Secondary | ICD-10-CM | POA: Diagnosis not present

## 2022-03-29 DIAGNOSIS — Z9049 Acquired absence of other specified parts of digestive tract: Secondary | ICD-10-CM | POA: Insufficient documentation

## 2022-03-29 DIAGNOSIS — Z79899 Other long term (current) drug therapy: Secondary | ICD-10-CM | POA: Diagnosis not present

## 2022-03-29 DIAGNOSIS — Z803 Family history of malignant neoplasm of breast: Secondary | ICD-10-CM | POA: Diagnosis not present

## 2022-03-29 DIAGNOSIS — N179 Acute kidney failure, unspecified: Secondary | ICD-10-CM | POA: Diagnosis not present

## 2022-03-29 DIAGNOSIS — Z885 Allergy status to narcotic agent status: Secondary | ICD-10-CM | POA: Diagnosis not present

## 2022-03-29 DIAGNOSIS — N1832 Chronic kidney disease, stage 3b: Secondary | ICD-10-CM | POA: Insufficient documentation

## 2022-03-29 DIAGNOSIS — Z8041 Family history of malignant neoplasm of ovary: Secondary | ICD-10-CM | POA: Diagnosis not present

## 2022-03-29 DIAGNOSIS — D649 Anemia, unspecified: Secondary | ICD-10-CM

## 2022-03-29 DIAGNOSIS — Z8249 Family history of ischemic heart disease and other diseases of the circulatory system: Secondary | ICD-10-CM | POA: Insufficient documentation

## 2022-03-29 DIAGNOSIS — Z882 Allergy status to sulfonamides status: Secondary | ICD-10-CM | POA: Diagnosis not present

## 2022-03-29 DIAGNOSIS — Z801 Family history of malignant neoplasm of trachea, bronchus and lung: Secondary | ICD-10-CM | POA: Diagnosis not present

## 2022-03-29 DIAGNOSIS — M25561 Pain in right knee: Secondary | ICD-10-CM | POA: Diagnosis not present

## 2022-03-29 DIAGNOSIS — Z888 Allergy status to other drugs, medicaments and biological substances status: Secondary | ICD-10-CM | POA: Diagnosis not present

## 2022-03-29 DIAGNOSIS — N83299 Other ovarian cyst, unspecified side: Secondary | ICD-10-CM | POA: Insufficient documentation

## 2022-03-29 DIAGNOSIS — D631 Anemia in chronic kidney disease: Secondary | ICD-10-CM | POA: Insufficient documentation

## 2022-03-29 LAB — CBC WITH DIFFERENTIAL/PLATELET
Abs Immature Granulocytes: 0 10*3/uL (ref 0.00–0.07)
Basophils Absolute: 0 10*3/uL (ref 0.0–0.1)
Basophils Relative: 0 %
Eosinophils Absolute: 0.1 10*3/uL (ref 0.0–0.5)
Eosinophils Relative: 4 %
HCT: 31.6 % — ABNORMAL LOW (ref 36.0–46.0)
Hemoglobin: 10.6 g/dL — ABNORMAL LOW (ref 12.0–15.0)
Immature Granulocytes: 0 %
Lymphocytes Relative: 29 %
Lymphs Abs: 0.9 10*3/uL (ref 0.7–4.0)
MCH: 32 pg (ref 26.0–34.0)
MCHC: 33.5 g/dL (ref 30.0–36.0)
MCV: 95.5 fL (ref 80.0–100.0)
Monocytes Absolute: 0.3 10*3/uL (ref 0.1–1.0)
Monocytes Relative: 11 %
Neutro Abs: 1.7 10*3/uL (ref 1.7–7.7)
Neutrophils Relative %: 56 %
Platelets: 245 10*3/uL (ref 150–400)
RBC: 3.31 MIL/uL — ABNORMAL LOW (ref 3.87–5.11)
RDW: 12.4 % (ref 11.5–15.5)
WBC: 3.1 10*3/uL — ABNORMAL LOW (ref 4.0–10.5)
nRBC: 0 % (ref 0.0–0.2)

## 2022-03-29 LAB — IRON AND TIBC
Iron: 180 ug/dL — ABNORMAL HIGH (ref 28–170)
Saturation Ratios: 56 % — ABNORMAL HIGH (ref 10.4–31.8)
TIBC: 321 ug/dL (ref 250–450)
UIBC: 141 ug/dL

## 2022-03-29 LAB — BASIC METABOLIC PANEL
Anion gap: 6 (ref 5–15)
BUN: 32 mg/dL — ABNORMAL HIGH (ref 8–23)
CO2: 26 mmol/L (ref 22–32)
Calcium: 9.9 mg/dL (ref 8.9–10.3)
Chloride: 103 mmol/L (ref 98–111)
Creatinine, Ser: 1.51 mg/dL — ABNORMAL HIGH (ref 0.44–1.00)
GFR, Estimated: 38 mL/min — ABNORMAL LOW (ref >60)
Glucose, Bld: 92 mg/dL (ref 70–99)
Potassium: 4.2 mmol/L (ref 3.5–5.1)
Sodium: 135 mmol/L (ref 135–145)

## 2022-03-29 LAB — FERRITIN: Ferritin: 413 ng/mL — ABNORMAL HIGH (ref 11–307)

## 2022-03-29 NOTE — Progress Notes (Signed)
[facility] ?OFFICE PROGRESS NOTE ? ?Patient Care Team: ?[name], MD as PCP - General (Family Medicine) ?[name], MD as Consulting Physician (Nephrology) ?Cammie Sickle, MD as Consulting Physician (Internal Medicine) ? ? ?SUMMARY OF HEMATOLOGIC-ONCOLOGIC HISTORY: ?2002- HEREDITARY HEMOCHROMATOSIS HOMOZYGOUS [goal ferritin 100-150]   ? ?# 2012- BMbx [sec to Anemia]-Normo-cellular; T Large Granular Lymphocytes by flowcytometry [4.5%] primary vs Reactive; FISH MDS- NEG ? ?# Hx of Stroke [2012; no deficits]; CKD stage III [Dr.Lateef; ? Kidney Bx] ? ? ?INTERVAL HISTORY:  ?66 year old female with history of anemia secondary to chronic kidney disease and homozygous hereditary hemochromatosis who is here for follow-up.  She follow-up. She was last evaluated by Dr. Rogue Bussing in January.  She has not required Retacrit since October 2022.  ? ?In the interim, she has done well.  She complains of joint pain especially her right knee.  Has baseline fatigue that is stable. ? ? ?Review of Systems  ?Constitutional:  Positive for malaise/fatigue. Negative for chills, fever and weight loss.  ?HENT:  Negative for congestion, ear pain and tinnitus.   ?Eyes: Negative.  Negative for blurred vision and double vision.  ?Respiratory: Negative.  Negative for cough, sputum production and shortness of breath.   ?Cardiovascular: Negative.  Negative for chest pain, palpitations and leg swelling.  ?Gastrointestinal: Negative.  Negative for abdominal pain, constipation, diarrhea, nausea and vomiting.  ?Genitourinary:  Negative for dysuria, frequency and urgency.  ?Musculoskeletal:  Positive for joint pain. Negative for back pain and falls.  ?Skin: Negative.  Negative for rash.  ?Neurological: Negative.  Negative for weakness and headaches.  ?Endo/Heme/Allergies: Negative.  Does not bruise/bleed easily.  ?Psychiatric/Behavioral: Negative.  Negative for depression. The patient is not nervous/anxious and does  not have insomnia.   ? ? ? ?PAST MEDICAL HISTORY :  ?Past Medical History:  ?Diagnosis Date  ? Abnormal CT scan, sinus   ? VASCULAR ABNORMALITY  ? Anemia   ? Anxiety   ? Back pain   ? Breast cyst 8+ years  ?  HAD SURGICAL F/U WITH DR Pat Patrick   ? Chronic kidney disease, stage 3b ([place])   ? Elevated ferritin level   ? Family history of adverse reaction to anesthesia   ? mother N/V  ? Hemochromatosis   ? Homozygous on genetic testing March 2002  ? Hypertension   ? Hypothyroidism   ? ? ?PAST SURGICAL HISTORY :   ?Past Surgical History:  ?Procedure Laterality Date  ? BONE MARROW BIOPSY  01/26/2011  ? FLOW SHOWS POSITIVE FOR LGL'S, FISH IS NEG,  ? BREAST EXCISIONAL BIOPSY Right 2011  ? Negative  ? CHOLECYSTECTOMY  2013  ? COLONOSCOPY  07/2011  ? ESOPHAGOGASTRODUODENOSCOPY  07/2011  ? [place]  ? LAPAROSCOPIC BILATERAL SALPINGO OOPHERECTOMY Bilateral 11/01/2021  ? Procedure: LAPAROSCOPIC BILATERAL SALPINGO OOPHORECTOMY;  Surgeon: Schermerhorn, Gwen Her, MD;  Location: [facility] ORS;  Service: Gynecology;  Laterality: Bilateral;  ? NASAL SEPTUM SURGERY    ? TONSILLECTOMY  1971  ? TUBAL LIGATION  1995  ? ? ?FAMILY HISTORY :   ?Family History  ?Problem Relation Age of Onset  ? Ovarian cancer Mother   ?     BRCA status reported negative  ? Cancer Mother   ? Heart disease Mother   ? Cancer - Ovarian Mother   ? Cancer Father   ? Lung cancer Father   ? Breast cancer Paternal Aunt   ? ? ?SOCIAL HISTORY:   ?Social History  ? ?Tobacco Use  ?  Smoking status: Never  ? Smokeless tobacco: Never  ?Vaping Use  ? Vaping Use: Never used  ?Substance Use Topics  ? Alcohol use: Yes  ?  Comment: occassional wine  ? Drug use: No  ? ? ?ALLERGIES:  is allergic to amlodipine, hydromorphone, and sulfa antibiotics. ? ?MEDICATIONS:  ?Current Outpatient Medications  ?Medication Sig Dispense Refill  ? aspirin 81 MG chewable tablet Chew 81 mg by mouth daily.    ? cetirizine (ZYRTEC) 10 MG tablet Take 10 mg by mouth daily.    ? cholecalciferol (VITAMIN D3)  25 MCG (1000 UNIT) tablet Take 1,000 Units by mouth in the morning and at bedtime.    ? diclofenac Sodium (VOLTAREN) 1 % GEL Apply 1 application topically daily as needed (pain).    ? docusate sodium (COLACE) 100 MG capsule Take 100 mg by mouth daily as needed for mild constipation.    ? folic acid (FOLVITE) 1 MG tablet Take 1 mg by mouth daily.    ? gabapentin (NEURONTIN) 300 MG capsule Take 300 mg by mouth 2 (two) times daily as needed (pain).    ? levothyroxine (SYNTHROID, LEVOTHROID) 50 MCG tablet Take 50 mcg by mouth daily before breakfast.    ? lisinopril (PRINIVIL,ZESTRIL) 20 MG tablet Take 20 mg by mouth daily.    ? Melatonin 2.5 MG CHEW Chew by mouth.    ? omeprazole (PRILOSEC) 20 MG capsule Take 20 mg by mouth daily as needed (acid reflux).    ? OZEMPIC, 0.25 OR 0.5 MG/DOSE, 2 MG/3ML SOPN SMARTSIG:0.1875 Milliliter(s) SUB-Q Once a Week    ? venlafaxine XR (EFFEXOR-XR) 75 MG 24 hr capsule Take 75 mg by mouth in the morning and at bedtime.    ? zinc gluconate 50 MG tablet Take 50 mg by mouth daily.    ? ALPRAZolam (XANAX) 0.25 MG tablet Take 0.25 mg by mouth daily as needed for anxiety. (Patient not taking: Reported on 03/29/2022)    ? cyclobenzaprine (FLEXERIL) 10 MG tablet Take 10 mg by mouth at bedtime. (Patient not taking: Reported on 03/29/2022)    ? ?No current facility-administered medications for this visit.  ? ? ?PHYSICAL EXAMINATION: ?ECOG PERFORMANCE STATUS: 0 - Asymptomatic ? ?There were no vitals taken for this visit. ? ?There were no vitals filed for this visit. ? ? ?Physical Exam ?Constitutional:   ?   Appearance: Normal appearance.  ?HENT:  ?   Head: Normocephalic and atraumatic.  ?Eyes:  ?   Pupils: Pupils are equal, round, and reactive to light.  ?Cardiovascular:  ?   Rate and Rhythm: Normal rate and regular rhythm.  ?   Heart sounds: Normal heart sounds. No murmur heard. ?Pulmonary:  ?   Effort: Pulmonary effort is normal.  ?   Breath sounds: Normal breath sounds. No wheezing.  ?Abdominal:   ?   General: Bowel sounds are normal. There is no distension.  ?   Palpations: Abdomen is soft.  ?   Tenderness: There is no abdominal tenderness.  ?Musculoskeletal:     ?   General: Normal range of motion.  ?   Cervical back: Normal range of motion.  ?Skin: ?   General: Skin is warm and dry.  ?   Findings: No rash.  ?Neurological:  ?   Mental Status: She is alert and oriented to person, place, and time.  ?   Gait: Gait is intact.  ?Psychiatric:     ?   Mood and Affect: Mood and affect normal.     ?  Cognition and Memory: Memory normal.     ?   Judgment: Judgment normal.  ? ? ? ?LABORATORY DATA:  ?I have reviewed the data as listed ?   ?Component Value Date/Time  ? NA 135 03/29/2022 1404  ? NA 141 04/13/2018 1517  ? NA 142 05/19/2012 1957  ? K 4.2 03/29/2022 1404  ? K 4.6 05/19/2012 1957  ? CL 103 03/29/2022 1404  ? CL 108 (H) 05/19/2012 1957  ? CO2 26 03/29/2022 1404  ? CO2 26 05/19/2012 1957  ? GLUCOSE 92 03/29/2022 1404  ? GLUCOSE 94 05/19/2012 1957  ? BUN 32 (H) 03/29/2022 1404  ? BUN 34 (H) 04/13/2018 1517  ? BUN 10 05/19/2012 1957  ? CREATININE 1.51 (H) 03/29/2022 1404  ? CREATININE 1.09 05/19/2012 1957  ? CALCIUM 9.9 03/29/2022 1404  ? CALCIUM 9.0 05/19/2012 1957  ? PROT 7.4 05/11/2021 1109  ? PROT 7.3 04/13/2018 1517  ? PROT 6.2 (L) 05/19/2012 1957  ? ALBUMIN 4.4 05/11/2021 1109  ? ALBUMIN 4.9 (H) 04/13/2018 1517  ? ALBUMIN 3.4 05/19/2012 1957  ? AST 23 05/11/2021 1109  ? AST 31 05/19/2012 1957  ? ALT 20 05/11/2021 1109  ? ALT 28 05/19/2012 1957  ? ALKPHOS 57 05/11/2021 1109  ? ALKPHOS 61 05/19/2012 1957  ? BILITOT 0.5 05/11/2021 1109  ? BILITOT 0.3 04/13/2018 1517  ? BILITOT 0.3 05/19/2012 1957  ? GFRNONAA 38 (L) 03/29/2022 1404  ? GFRNONAA 56 (L) 05/19/2012 1957  ? GFRAA 58 (L) 04/27/2020 1035  ? GFRAA >60 05/19/2012 1957  ? ? ?No results found for: SPEP, UPEP ? ?Lab Results  ?Component Value Date  ? WBC 3.1 (L) 03/29/2022  ? NEUTROABS 1.7 03/29/2022  ? HGB 10.6 (L) 03/29/2022  ? HCT 31.6 (L) 03/29/2022   ? MCV 95.5 03/29/2022  ? PLT 245 03/29/2022  ? ? ?  Chemistry   ?   ?Component Value Date/Time  ? NA 135 03/29/2022 1404  ? NA 141 04/13/2018 1517  ? NA 142 05/19/2012 1957  ? K 4.2 03/29/2022 1404  ?

## 2022-04-18 DIAGNOSIS — N2581 Secondary hyperparathyroidism of renal origin: Secondary | ICD-10-CM | POA: Diagnosis not present

## 2022-04-18 DIAGNOSIS — N1832 Chronic kidney disease, stage 3b: Secondary | ICD-10-CM | POA: Diagnosis not present

## 2022-04-18 DIAGNOSIS — D631 Anemia in chronic kidney disease: Secondary | ICD-10-CM | POA: Diagnosis not present

## 2022-04-18 DIAGNOSIS — I10 Essential (primary) hypertension: Secondary | ICD-10-CM | POA: Diagnosis not present

## 2022-05-16 DIAGNOSIS — E669 Obesity, unspecified: Secondary | ICD-10-CM | POA: Diagnosis not present

## 2022-05-16 DIAGNOSIS — E785 Hyperlipidemia, unspecified: Secondary | ICD-10-CM | POA: Diagnosis not present

## 2022-05-16 DIAGNOSIS — E039 Hypothyroidism, unspecified: Secondary | ICD-10-CM | POA: Diagnosis not present

## 2022-06-21 DIAGNOSIS — Z1331 Encounter for screening for depression: Secondary | ICD-10-CM | POA: Diagnosis not present

## 2022-06-21 DIAGNOSIS — Z01419 Encounter for gynecological examination (general) (routine) without abnormal findings: Secondary | ICD-10-CM | POA: Diagnosis not present

## 2022-06-24 ENCOUNTER — Ambulatory Visit: Payer: Medicare HMO | Admitting: Anesthesiology

## 2022-06-24 ENCOUNTER — Encounter
Admission: RE | Disposition: A | Discharge status: Home / Self Care | Payer: Self-pay | Source: Home / Self Care | Attending: Gastroenterology

## 2022-06-24 ENCOUNTER — Other Ambulatory Visit: Payer: Self-pay

## 2022-06-24 ENCOUNTER — Encounter: Payer: Self-pay | Admitting: Gastroenterology

## 2022-06-24 ENCOUNTER — Ambulatory Visit
Admission: RE | Admit: 2022-06-24 | Discharge: 2022-06-24 | Disposition: A | Discharge status: Home / Self Care | Payer: Medicare HMO | Attending: Gastroenterology | Admitting: Gastroenterology

## 2022-06-24 DIAGNOSIS — D123 Benign neoplasm of transverse colon: Secondary | ICD-10-CM | POA: Insufficient documentation

## 2022-06-24 DIAGNOSIS — K219 Gastro-esophageal reflux disease without esophagitis: Secondary | ICD-10-CM | POA: Insufficient documentation

## 2022-06-24 DIAGNOSIS — Z1211 Encounter for screening for malignant neoplasm of colon: Secondary | ICD-10-CM | POA: Insufficient documentation

## 2022-06-24 DIAGNOSIS — K5730 Diverticulosis of large intestine without perforation or abscess without bleeding: Secondary | ICD-10-CM | POA: Insufficient documentation

## 2022-06-24 DIAGNOSIS — K449 Diaphragmatic hernia without obstruction or gangrene: Secondary | ICD-10-CM | POA: Insufficient documentation

## 2022-06-24 DIAGNOSIS — I129 Hypertensive chronic kidney disease with stage 1 through stage 4 chronic kidney disease, or unspecified chronic kidney disease: Secondary | ICD-10-CM | POA: Diagnosis not present

## 2022-06-24 DIAGNOSIS — D631 Anemia in chronic kidney disease: Secondary | ICD-10-CM | POA: Insufficient documentation

## 2022-06-24 DIAGNOSIS — E039 Hypothyroidism, unspecified: Secondary | ICD-10-CM | POA: Diagnosis not present

## 2022-06-24 DIAGNOSIS — D124 Benign neoplasm of descending colon: Secondary | ICD-10-CM | POA: Diagnosis not present

## 2022-06-24 DIAGNOSIS — D125 Benign neoplasm of sigmoid colon: Secondary | ICD-10-CM | POA: Diagnosis not present

## 2022-06-24 DIAGNOSIS — N1832 Chronic kidney disease, stage 3b: Secondary | ICD-10-CM | POA: Diagnosis not present

## 2022-06-24 DIAGNOSIS — Z8673 Personal history of transient ischemic attack (TIA), and cerebral infarction without residual deficits: Secondary | ICD-10-CM | POA: Insufficient documentation

## 2022-06-24 DIAGNOSIS — K640 First degree hemorrhoids: Secondary | ICD-10-CM | POA: Insufficient documentation

## 2022-06-24 DIAGNOSIS — D649 Anemia, unspecified: Secondary | ICD-10-CM | POA: Diagnosis not present

## 2022-06-24 DIAGNOSIS — K635 Polyp of colon: Secondary | ICD-10-CM | POA: Diagnosis not present

## 2022-06-24 DIAGNOSIS — F419 Anxiety disorder, unspecified: Secondary | ICD-10-CM | POA: Diagnosis not present

## 2022-06-24 HISTORY — PX: ESOPHAGOGASTRODUODENOSCOPY: SHX5428

## 2022-06-24 HISTORY — PX: COLONOSCOPY: SHX5424

## 2022-06-24 SURGERY — COLONOSCOPY
Anesthesia: General

## 2022-06-24 MED ORDER — LIDOCAINE HCL (CARDIAC) PF 100 MG/5ML IV SOSY
PREFILLED_SYRINGE | INTRAVENOUS | Status: DC | PRN
  Administered 2022-06-24: 100 mg via INTRAVENOUS

## 2022-06-24 MED ORDER — PROPOFOL 10 MG/ML IV BOLUS
INTRAVENOUS | Status: AC
  Filled 2022-06-24: qty 40

## 2022-06-24 MED ORDER — LIDOCAINE HCL (PF) 2 % IJ SOLN
INTRAMUSCULAR | Status: AC
  Filled 2022-06-24: qty 5

## 2022-06-24 MED ORDER — PROPOFOL 10 MG/ML IV BOLUS
INTRAVENOUS | Status: AC
  Filled 2022-06-24: qty 20

## 2022-06-24 MED ORDER — PROPOFOL 500 MG/50ML IV EMUL
INTRAVENOUS | Status: DC | PRN
  Administered 2022-06-24: 150 ug/kg/min via INTRAVENOUS

## 2022-06-24 MED ORDER — SODIUM CHLORIDE 0.9 % IV SOLN
INTRAVENOUS | Status: DC
Start: 2022-06-24 — End: 2022-06-24

## 2022-06-24 MED ORDER — PROPOFOL 10 MG/ML IV BOLUS
INTRAVENOUS | Status: DC | PRN
  Administered 2022-06-24: 120 mg via INTRAVENOUS

## 2022-06-24 NOTE — Op Note (Signed)
[name] Gastroenterology Patient Name: Katie Wise Procedure Date: 06/24/2022 8:52 AM MRN: [id number] Account #: [id number] Date of Birth: 02/07/1956 Admit Type: Outpatient Age: 66 Room: [facility] ENDO ROOM 1 Gender: Female Note Status: Finalized Instrument Name: Upper Endoscope [id number] Procedure:             Upper GI endoscopy Indications:           Suspected esophageal reflux Providers:             Rueben Bash, DO Referring MD:          Youlanda Roys. Lovie Macadamia, MD (Referring MD) Medicines:             Monitored Anesthesia Care Complications:         No immediate complications. Estimated blood loss: None. Procedure:             Pre-Anesthesia Assessment:                        - Prior to the procedure, a History and Physical was                         performed, and patient medications and allergies were                         reviewed. The patient is competent. The risks and                         benefits of the procedure and the sedation options and                         risks were discussed with the patient. All questions                         were answered and informed consent was obtained.                         Patient identification and proposed procedure were                         verified by the physician, the nurse, the anesthetist                         and the technician in the endoscopy suite. Mental                         Status Examination: alert and oriented. Airway                         Examination: normal oropharyngeal airway and neck                         mobility. Respiratory Examination: clear to                         auscultation. CV Examination: RRR, no murmurs, no S3                         or S4. Prophylactic Antibiotics: The patient does not  require prophylactic antibiotics. Prior                         Anticoagulants: The patient has taken no previous                          anticoagulant or antiplatelet agents. ASA Grade                         Assessment: II - A patient with mild systemic disease.                         After reviewing the risks and benefits, the patient                         was deemed in satisfactory condition to undergo the                         procedure. The anesthesia plan was to use monitored                         anesthesia care (MAC). Immediately prior to                         administration of medications, the patient was                         re-assessed for adequacy to receive sedatives. The                         heart rate, respiratory rate, oxygen saturations,                         blood pressure, adequacy of pulmonary ventilation, and                         response to care were monitored throughout the                         procedure. The physical status of the patient was                         re-assessed after the procedure.                        After obtaining informed consent, the endoscope was                         passed under direct vision. Throughout the procedure,                         the patient's blood pressure, pulse, and oxygen                         saturations were monitored continuously. The Endoscope                         was introduced through the mouth, and advanced to the  second part of duodenum. The upper GI endoscopy was                         accomplished without difficulty. The patient tolerated                         the procedure well. Findings:      The duodenal bulb, first portion of the duodenum and second portion of       the duodenum were normal. Estimated blood loss: none.      A small hiatal hernia was present. Estimated blood loss: none.      The entire examined stomach was normal. Estimated blood loss: none.      The Z-line was regular. Estimated blood loss: none.      Esophagogastric landmarks were identified: the gastroesophageal  junction       was found at 34 cm from the incisors.      The examined esophagus was normal. Estimated blood loss: none. Impression:            - Normal duodenal bulb, first portion of the duodenum                         and second portion of the duodenum.                        - Small hiatal hernia.                        - Normal stomach.                        - Z-line regular.                        - Esophagogastric landmarks identified.                        - Normal esophagus.                        - No specimens collected. Recommendation:        - Discharge patient to home.                        - Resume previous diet.                        - Continue present medications.                        - Return to GI clinic as previously scheduled.                        - proceed with colonoscopy                        - The findings and recommendations were discussed with                         the patient. Procedure Code(s):     --- Professional ---  85277, Esophagogastroduodenoscopy, flexible,                         transoral; diagnostic, including collection of                         specimen(s) by brushing or washing, when performed                         (separate procedure) Diagnosis Code(s):     --- Professional ---                        K44.9, Diaphragmatic hernia without obstruction or                         gangrene CPT copyright 2019 American Medical Association. All rights reserved. The codes documented in this report are preliminary and upon coder review may  be revised to meet current compliance requirements. Attending Participation:      I personally performed the entire procedure. Volney American, DO Annamaria Helling DO, DO 06/24/2022 9:15:37 AM This report has been signed electronically. Number of Addenda: 0 Note Initiated On: 06/24/2022 8:52 AM Estimated Blood Loss:  Estimated blood loss: none.      [facility]

## 2022-06-24 NOTE — Op Note (Signed)
[facility] Gastroenterology Patient Name: Katie Wise Procedure Date: 06/24/2022 8:49 AM MRN: [id number] Account #: [id number] Date of Birth: Apr 13, 1956 Admit Type: Outpatient Age: 66 Room: [facility] ENDO ROOM 1 Gender: Female Note Status: Finalized Instrument Name: Colonoscope [id number] Procedure:             Colonoscopy Indications:           Screening for colorectal malignant neoplasm Providers:             Rueben Bash, DO Referring MD:          Youlanda Roys. Lovie Macadamia, MD (Referring MD) Medicines:             Monitored Anesthesia Care Complications:         No immediate complications. Estimated blood loss:                         Minimal. Procedure:             Pre-Anesthesia Assessment:                        - Prior to the procedure, a History and Physical was                         performed, and patient medications and allergies were                         reviewed. The patient is competent. The risks and                         benefits of the procedure and the sedation options and                         risks were discussed with the patient. All questions                         were answered and informed consent was obtained.                         Patient identification and proposed procedure were                         verified by the physician, the nurse, the anesthetist                         and the technician in the endoscopy suite. Mental                         Status Examination: alert and oriented. Airway                         Examination: normal oropharyngeal airway and neck                         mobility. Respiratory Examination: clear to                         auscultation. CV Examination: RRR, no murmurs, no S3  or S4. Prophylactic Antibiotics: The patient does not                         require prophylactic antibiotics. Prior                         Anticoagulants: The patient has taken no previous                          anticoagulant or antiplatelet agents. ASA Grade                         Assessment: II - A patient with mild systemic disease.                         After reviewing the risks and benefits, the patient                         was deemed in satisfactory condition to undergo the                         procedure. The anesthesia plan was to use monitored                         anesthesia care (MAC). Immediately prior to                         administration of medications, the patient was                         re-assessed for adequacy to receive sedatives. The                         heart rate, respiratory rate, oxygen saturations,                         blood pressure, adequacy of pulmonary ventilation, and                         response to care were monitored throughout the                         procedure. The physical status of the patient was                         re-assessed after the procedure.                        After obtaining informed consent, the colonoscope was                         passed under direct vision. Throughout the procedure,                         the patient's blood pressure, pulse, and oxygen                         saturations were monitored continuously. The  Colonoscope was introduced through the anus and                         advanced to the the terminal ileum, with                         identification of the appendiceal orifice and IC                         valve. The colonoscopy was performed without                         difficulty. The patient tolerated the procedure well.                         The quality of the bowel preparation was evaluated                         using the BBPS [facility] Bowel Preparation Scale) with                         scores of: Right Colon = 3, Transverse Colon = 3 and                         Left Colon = 3 (entire mucosa seen well with no                          residual staining, small fragments of stool or opaque                         liquid). The total BBPS score equals 9. There was a                         fair amount of seeds/medication capsules that needed                         to be washed away/suctioned to acquire better                         visualization. The terminal ileum, ileocecal valve,                         appendiceal orifice, and rectum were photographed. Findings:      The perianal and digital rectal examinations were normal. Pertinent       negatives include normal sphincter tone.      The terminal ileum appeared normal. Estimated blood loss: none.      Multiple small-mouthed diverticula were found in the left colon.       Estimated blood loss: none.      Non-bleeding internal hemorrhoids were found during retroflexion. The       hemorrhoids were Grade I (internal hemorrhoids that do not prolapse).       Estimated blood loss: none.      Three sessile polyps were found in the sigmoid colon and transverse       colon. The polyps were 3 to 5 mm in size. These polyps were removed with       a cold  snare. Resection and retrieval were complete. Estimated blood       loss was minimal. One of two cold snare polyps in sigmoid colon was not       recovered, but completely resected.      A 1 to 2 mm polyp was found in the descending colon. The polyp was       sessile. The polyp was removed with a jumbo cold forceps. Resection and       retrieval were complete. Estimated blood loss was minimal.      The exam was otherwise without abnormality on direct and retroflexion       views. Impression:            - The examined portion of the ileum was normal.                        - Diverticulosis in the left colon.                        - Non-bleeding internal hemorrhoids.                        - Three 3 to 5 mm polyps in the sigmoid colon and in                         the transverse colon, removed with a cold snare.                          Resected and retrieved.                        - One 1 to 2 mm polyp in the descending colon, removed                         with a jumbo cold forceps. Resected and retrieved.                        - The examination was otherwise normal on direct and                         retroflexion views. Recommendation:        - Discharge patient to home.                        - Resume previous diet.                        - Continue present medications.                        - No aspirin, ibuprofen, naproxen, or other                         non-steroidal anti-inflammatory drugs for 5 days after                         polyp removal.                        - Await pathology results.                        -  Repeat colonoscopy for surveillance based on                         pathology results.                        - Return to referring physician as previously                         scheduled.                        - The findings and recommendations were discussed with                         the patient. Procedure Code(s):     --- Professional ---                        [phone], Colonoscopy, flexible; with removal of                         tumor(s), polyp(s), or other lesion(s) by snare                         technique                        45380, 85, Colonoscopy, flexible; with biopsy, single                         or multiple Diagnosis Code(s):     --- Professional ---                        K63.5, Polyp of colon                        Z12.11, Encounter for screening for malignant neoplasm                         of colon                        K64.0, First degree hemorrhoids                        K57.30, Diverticulosis of large intestine without                         perforation or abscess without bleeding CPT copyright 2019 American Medical Association. All rights reserved. The codes documented in this report are preliminary and upon coder review may  be revised to  meet current compliance requirements. Attending Participation:      I personally performed the entire procedure. Volney American, DO Annamaria Helling DO, DO 06/24/2022 9:58:20 AM This report has been signed electronically. Number of Addenda: 0 Note Initiated On: 06/24/2022 8:49 AM Scope Withdrawal Time: 0 hours 25 minutes 6 seconds  Total Procedure Duration: 0 hours 31 minutes 2 seconds  Estimated Blood Loss:  Estimated blood loss was minimal.      [facility]

## 2022-06-24 NOTE — H&P (Signed)
Pre-Procedure H&P   Patient ID: Katie Wise is a 66 y.o. female.  Gastroenterology Provider: Annamaria Helling, DO  Referring Provider: Laurine Blazer, PA PCP: Katie Pitch, MD  Date: 06/24/2022  HPI [name] is a 66 y.o. female who presents today for Esophagogastroduodenoscopy and Colonoscopy for GERD; screening colonoscopy.  Patient with worsening reflux despite change to PPI from Tums.  She denies any dysphagia or odynophagia.  Appetite and weight have been stable.  Undergoing screening colonoscopy.  Her last colonoscopy was in 2012 and was normal per report  Rare bright red blood per rectum associated with wiping.  Has a bowel movement daily to every other day  She does have anemia of chronic disease.  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   Hypertension    Hypothyroidism     Past Surgical History:  Procedure Laterality Date   BONE MARROW BIOPSY  01/26/2011   FLOW SHOWS POSITIVE FOR LGL'S, FISH IS NEG,   BREAST EXCISIONAL BIOPSY Right 2011   Negative   CHOLECYSTECTOMY  2013   COLONOSCOPY  07/2011   ESOPHAGOGASTRODUODENOSCOPY  07/2011   HYMENECTOMY  1973   LAPAROSCOPIC BILATERAL SALPINGO OOPHERECTOMY Bilateral 11/01/2021   Procedure: LAPAROSCOPIC BILATERAL SALPINGO OOPHORECTOMY;  Surgeon: Boykin Nearing, MD;  Location: [facility] ORS;  Service: Gynecology;  Laterality: Bilateral;   NASAL SEPTUM SURGERY     TONSILLECTOMY  1971   TUBAL LIGATION  1995    Family History No h/o GI disease or  malignancy  Review of Systems  Constitutional:  Negative for activity change, appetite change, chills, diaphoresis, fatigue, fever and unexpected weight change.  HENT:  Negative for trouble swallowing and voice change.   Respiratory:  Negative for shortness of breath and wheezing.   Cardiovascular:  Negative for chest pain, palpitations and leg swelling.  Gastrointestinal:  Negative for abdominal distention, abdominal pain, anal bleeding, blood in stool, constipation, diarrhea, nausea, rectal pain and vomiting.       Gerd  Musculoskeletal:  Negative for arthralgias and myalgias.  Skin:  Negative for color change and pallor.  Neurological:  Negative for dizziness, syncope and weakness.  Psychiatric/Behavioral:  Negative for confusion.   All other systems reviewed and are negative.    Medications No current facility-administered medications on file prior to encounter.   Current Outpatient Medications on File Prior to Encounter  Medication Sig Dispense Refill   ALPRAZolam (XANAX) 0.25 MG tablet Take 0.25 mg by mouth daily as needed for anxiety.     aspirin 81 MG chewable tablet Chew 81 mg by mouth daily.     cetirizine (ZYRTEC) 10 MG tablet Take 10 mg by mouth daily.     cholecalciferol (VITAMIN D3) 25 MCG (1000 UNIT) tablet Take 1,000 Units by mouth in the morning and at bedtime.     cyclobenzaprine (FLEXERIL) 10 MG tablet Take 10 mg by mouth at bedtime.     diclofenac  Sodium (VOLTAREN) 1 % GEL Apply 1 application topically daily as needed (pain).     docusate sodium (COLACE) 100 MG capsule Take 100 mg by mouth daily as needed for mild constipation.     folic acid (FOLVITE) 1 MG tablet Take 1 mg by mouth daily.     gabapentin (NEURONTIN) 300 MG capsule Take 300 mg by mouth 2 (two) times daily as needed (pain).     levothyroxine (SYNTHROID, LEVOTHROID) 50 MCG tablet Take 50 mcg by mouth daily before breakfast.     lisinopril (PRINIVIL,ZESTRIL) 20 MG tablet Take 20 mg by mouth daily.      Melatonin 2.5 MG CHEW Chew by mouth.     omeprazole (PRILOSEC) 20 MG capsule Take 20 mg by mouth daily as needed (acid reflux).     OZEMPIC, 0.25 OR 0.5 MG/DOSE, 2 MG/3ML SOPN SMARTSIG:0.1875 Milliliter(s) SUB-Q Once a Week     venlafaxine XR (EFFEXOR-XR) 75 MG 24 hr capsule Take 75 mg by mouth in the morning and at bedtime.     zinc gluconate 50 MG tablet Take 50 mg by mouth daily.      Pertinent medications related to GI and procedure were reviewed by me with the patient prior to the procedure   Current Facility-Administered Medications:    0.9 %  sodium chloride infusion, , Intravenous, Continuous, Katie Helling, DO, Last Rate: 20 mL/hr at 06/24/22 0836, New Bag at 06/24/22 0836      Allergies  Allergen Reactions   Amlodipine Itching   Hydromorphone Itching   Sulfa Antibiotics Hives   Allergies were reviewed by me prior to the procedure  Objective   Body mass index is 26.57 kg/m. Vitals:   06/24/22 0821  BP: 137/78  Pulse: 73  Resp: 16  Temp: (!) 96.8 F (36 C)  TempSrc: Temporal  SpO2: 100%  Weight: 68 kg  Height: '5\' 3"'  (1.6 m)     Physical Exam Vitals and nursing note reviewed.  Constitutional:      General: She is not in acute distress.    Appearance: Normal appearance. She is not ill-appearing, toxic-appearing or diaphoretic.  HENT:     Head: Normocephalic and atraumatic.     Nose: Nose normal.     Mouth/Throat:     Mouth: Mucous membranes are moist.     Pharynx: Oropharynx is clear.  Eyes:     General: No scleral icterus.    Extraocular Movements: Extraocular movements intact.  Cardiovascular:     Rate and Rhythm: Normal rate and regular rhythm.     Heart sounds: Normal heart sounds. No murmur heard.    No friction rub. No gallop.  Pulmonary:     Effort: Pulmonary effort is normal. No respiratory distress.     Breath sounds: Normal breath sounds. No wheezing, rhonchi or rales.  Abdominal:     General: Bowel sounds are normal. There is no  distension.     Palpations: Abdomen is soft.     Tenderness: There is no abdominal tenderness. There is no guarding or rebound.  Musculoskeletal:     Cervical back: Neck supple.     Right lower leg: No edema.     Left lower leg: No edema.  Skin:    General: Skin is warm and dry.     Coloration: Skin is not jaundiced or pale.  Neurological:     General: No focal deficit present.     Mental Status: She is alert and oriented to person, place, and  time. Mental status is at baseline.  Psychiatric:        Mood and Affect: Mood normal.        Behavior: Behavior normal.        Thought Content: Thought content normal.        Judgment: Judgment normal.      Assessment:  [name] is a 66 y.o. female  who presents today for Esophagogastroduodenoscopy and Colonoscopy for GERD; screening colonoscopy.  Plan:  Esophagogastroduodenoscopy and Colonoscopy with possible intervention today  Esophagogastroduodenoscopy and Colonoscopy with possible biopsy, control of bleeding, polypectomy, and interventions as necessary has been discussed with the patient/patient representative. Informed consent was obtained from the patient/patient representative after explaining the indication, nature, and risks of the procedure including but not limited to death, bleeding, perforation, missed neoplasm/lesions, cardiorespiratory compromise, and reaction to medications. Opportunity for questions was given and appropriate answers were provided. Patient/patient representative has verbalized understanding is amenable to undergoing the procedure.   Katie Helling, DO  [facility]  Portions of the record may have been created with voice recognition software. Occasional wrong-word or 'sound-a-like' substitutions may have occurred due to the inherent limitations of voice recognition software.  Read the chart carefully and recognize, using context, where substitutions may have occurred.

## 2022-06-24 NOTE — Interval H&P Note (Signed)
History and Physical Interval Note: Preprocedure H&P from 06/24/22  was reviewed and there was no interval change after seeing and examining the patient.  Written consent was obtained from the patient after discussion of risks, benefits, and alternatives. Patient has consented to proceed with Esophagogastroduodenoscopy and Colonoscopy with possible intervention   06/24/2022 8:59 AM  Katie Wise  has presented today for surgery, with the diagnosis of Colon cancer screening (Z12.11) Chronic anemia, unspecified (D64.9) Gastroesophageal reflux disease, unspecified whether esophagitis present (K21.9).  The various methods of treatment have been discussed with the patient and family. After consideration of risks, benefits and other options for treatment, the patient has consented to  Procedure(s): COLONOSCOPY (N/A) ESOPHAGOGASTRODUODENOSCOPY (EGD) (N/A) as a surgical intervention.  The patient's history has been reviewed, patient examined, no change in status, stable for surgery.  I have reviewed the patient's chart and labs.  Questions were answered to the patient's satisfaction.     Annamaria Helling

## 2022-06-24 NOTE — Anesthesia Preprocedure Evaluation (Addendum)
Anesthesia Evaluation  Patient identified by MRN, date of birth, ID band Patient awake    Reviewed: Allergy & Precautions, NPO status , Patient's Chart, lab work & pertinent test results  History of Anesthesia Complications Negative for: history of anesthetic complications  Airway Mallampati: II   Neck ROM: Full    Dental no notable dental hx.    Pulmonary neg pulmonary ROS,    Pulmonary exam normal breath sounds clear to auscultation       Cardiovascular hypertension, Normal cardiovascular exam Rhythm:Regular Rate:Normal  ECG 11/02/21: normal   Neuro/Psych  Headaches, Anxiety CVA (2012; no residual deficits)    GI/Hepatic GERD  ,  Endo/Other  Hypothyroidism On Ozempic, last dose 06/19/22  Renal/GU Renal disease (stage III CKD)     Musculoskeletal   Abdominal   Peds  Hematology  (+) Blood dyscrasia (hemochromatosis), anemia ,   Anesthesia Other Findings   Reproductive/Obstetrics                            Anesthesia Physical Anesthesia Plan  ASA: 2  Anesthesia Plan: General   Post-op Pain Management:    Induction: Intravenous and Rapid sequence  PONV Risk Score and Plan: 3 and Ondansetron, Dexamethasone and Treatment may vary due to age or medical condition  Airway Management Planned: Oral ETT  Additional Equipment:   Intra-op Plan:   Post-operative Plan: Extubation in OR  Informed Consent: I have reviewed the patients History and Physical, chart, labs and discussed the procedure including the risks, benefits and alternatives for the proposed anesthesia with the patient or authorized representative who has indicated his/her understanding and acceptance.     Dental advisory given  Plan Discussed with: CRNA  Anesthesia Plan Comments: (Patient consented for risks of anesthesia including but not limited to:  - adverse reactions to medications - damage to eyes, teeth, lips or  other oral mucosa - nerve damage due to positioning  - sore throat or hoarseness - damage to heart, brain, nerves, lungs, other parts of body or loss of life  Informed patient about role of CRNA in peri- and intra-operative care.  Patient voiced understanding.)        Anesthesia Quick Evaluation

## 2022-06-24 NOTE — Anesthesia Postprocedure Evaluation (Signed)
Anesthesia Post Note  Patient: Katie Wise  Procedure(s) Performed: COLONOSCOPY ESOPHAGOGASTRODUODENOSCOPY (EGD)  Patient location during evaluation: PACU Anesthesia Type: General Level of consciousness: awake and alert, oriented and patient cooperative Pain management: pain level controlled Vital Signs Assessment: post-procedure vital signs reviewed and stable Respiratory status: spontaneous breathing, nonlabored ventilation and respiratory function stable Cardiovascular status: blood pressure returned to baseline and stable Postop Assessment: adequate PO intake Anesthetic complications: no   No notable events documented.   Last Vitals:  Vitals:   06/24/22 1000 06/24/22 1007  BP: (!) 82/57 100/75  Pulse: 95 82  Resp: 14 14  Temp:    SpO2: 100% 100%    Last Pain:  Vitals:   06/24/22 0957  TempSrc: Temporal  PainSc:                  Darrin Nipper

## 2022-06-24 NOTE — Transfer of Care (Signed)
Immediate Anesthesia Transfer of Care Note  Patient: Katie Wise  Procedure(s) Performed: COLONOSCOPY ESOPHAGOGASTRODUODENOSCOPY (EGD)  Patient Location: PACU  Anesthesia Type:General  Level of Consciousness: awake  Airway & Oxygen Therapy: Patient Spontanous Breathing  Post-op Assessment: Report given to RN and Post -op Vital signs reviewed and stable  Post vital signs: Reviewed and stable  Last Vitals:  Vitals Value Taken Time  BP    Temp    Pulse    Resp    SpO2      Last Pain:  Vitals:   06/24/22 0821  TempSrc: Temporal  PainSc: 0-No pain         Complications: No notable events documented.

## 2022-06-27 ENCOUNTER — Encounter: Payer: Self-pay | Admitting: Gastroenterology

## 2022-06-27 LAB — SURGICAL PATHOLOGY

## 2022-06-29 ENCOUNTER — Inpatient Hospital Stay: Payer: Medicare HMO

## 2022-06-29 ENCOUNTER — Inpatient Hospital Stay: Payer: Medicare HMO | Admitting: Internal Medicine

## 2022-07-20 ENCOUNTER — Inpatient Hospital Stay: Payer: Medicare HMO | Admitting: Internal Medicine

## 2022-07-20 ENCOUNTER — Inpatient Hospital Stay: Payer: Medicare HMO | Attending: Internal Medicine

## 2022-07-20 ENCOUNTER — Inpatient Hospital Stay: Payer: Medicare HMO

## 2022-07-20 ENCOUNTER — Encounter: Payer: Self-pay | Admitting: Internal Medicine

## 2022-07-20 DIAGNOSIS — R5383 Other fatigue: Secondary | ICD-10-CM | POA: Insufficient documentation

## 2022-07-20 DIAGNOSIS — D649 Anemia, unspecified: Secondary | ICD-10-CM

## 2022-07-20 DIAGNOSIS — D631 Anemia in chronic kidney disease: Secondary | ICD-10-CM | POA: Diagnosis not present

## 2022-07-20 DIAGNOSIS — N179 Acute kidney failure, unspecified: Secondary | ICD-10-CM

## 2022-07-20 DIAGNOSIS — N1832 Chronic kidney disease, stage 3b: Secondary | ICD-10-CM | POA: Insufficient documentation

## 2022-07-20 DIAGNOSIS — Z79899 Other long term (current) drug therapy: Secondary | ICD-10-CM | POA: Insufficient documentation

## 2022-07-20 DIAGNOSIS — Z8673 Personal history of transient ischemic attack (TIA), and cerebral infarction without residual deficits: Secondary | ICD-10-CM | POA: Insufficient documentation

## 2022-07-20 LAB — CBC WITH DIFFERENTIAL/PLATELET
Abs Immature Granulocytes: 0.01 10*3/uL (ref 0.00–0.07)
Basophils Absolute: 0 10*3/uL (ref 0.0–0.1)
Basophils Relative: 0 %
Eosinophils Absolute: 0.1 10*3/uL (ref 0.0–0.5)
Eosinophils Relative: 4 %
HCT: 33 % — ABNORMAL LOW (ref 36.0–46.0)
Hemoglobin: 11.1 g/dL — ABNORMAL LOW (ref 12.0–15.0)
Immature Granulocytes: 0 %
Lymphocytes Relative: 29 %
Lymphs Abs: 1 10*3/uL (ref 0.7–4.0)
MCH: 32.6 pg (ref 26.0–34.0)
MCHC: 33.6 g/dL (ref 30.0–36.0)
MCV: 96.8 fL (ref 80.0–100.0)
Monocytes Absolute: 0.4 10*3/uL (ref 0.1–1.0)
Monocytes Relative: 11 %
Neutro Abs: 1.9 10*3/uL (ref 1.7–7.7)
Neutrophils Relative %: 56 %
Platelets: 264 10*3/uL (ref 150–400)
RBC: 3.41 MIL/uL — ABNORMAL LOW (ref 3.87–5.11)
RDW: 12.8 % (ref 11.5–15.5)
WBC: 3.4 10*3/uL — ABNORMAL LOW (ref 4.0–10.5)
nRBC: 0 % (ref 0.0–0.2)

## 2022-07-20 LAB — BASIC METABOLIC PANEL
Anion gap: 8 (ref 5–15)
BUN: 33 mg/dL — ABNORMAL HIGH (ref 8–23)
CO2: 26 mmol/L (ref 22–32)
Calcium: 9.7 mg/dL (ref 8.9–10.3)
Chloride: 102 mmol/L (ref 98–111)
Creatinine, Ser: 1.43 mg/dL — ABNORMAL HIGH (ref 0.44–1.00)
GFR, Estimated: 40 mL/min — ABNORMAL LOW (ref >60)
Glucose, Bld: 89 mg/dL (ref 70–99)
Potassium: 5.1 mmol/L (ref 3.5–5.1)
Sodium: 136 mmol/L (ref 135–145)

## 2022-07-20 LAB — IRON AND TIBC
Iron: 169 ug/dL (ref 28–170)
Saturation Ratios: 51 % — ABNORMAL HIGH (ref 10.4–31.8)
TIBC: 333 ug/dL (ref 250–450)
UIBC: 164 ug/dL

## 2022-07-20 LAB — FERRITIN: Ferritin: 480 ng/mL — ABNORMAL HIGH (ref 11–307)

## 2022-07-20 NOTE — Progress Notes (Signed)
Patient denies new problems/concerns today.   °

## 2022-07-20 NOTE — Assessment & Plan Note (Addendum)
#   worsening anemia-hemoglobin 8.3-9.0-likely secondary to CKD-III.   No evidence of iron deficiency-on Retacrit. [EGD/[facility]- 2023]  # Today hemoglobin is 11.6- improved.  Hold Retacrit today.  STABLE; continue Retacrit every 3-6 months.  Not on oral iron/hemochromatosis.    #CKD-GFR in 34- creatinine 1.5 etiology unclear; October 2021 ultrasound kidneys negative; UA negative. ? NSIADs vs others.  No Kidney Bx- STABLE [Dr.Lateef].   # Hereditary hemochromatosis- homozygous [as per patient]-without organ dysfunction-especially with the ongoing anemia.  Monitor for now  # DISPOSITION:  # HOLD RETACRIT  # Follow up in 6 months MD; labs- cbc/bmp/iron studies/ferritin-possible retacrit-Dr.B.   Cc; Dr.Bronstein

## 2022-07-20 NOTE — Progress Notes (Signed)
Katie Wise OFFICE PROGRESS NOTE  Patient Care Team: [name], MD as PCP - General (Family Medicine) Katie Legato, MD as Consulting Physician (Nephrology) Cammie Sickle, MD as Consulting Physician (Internal Medicine)   SUMMARY OF HEMATOLOGIC-ONCOLOGIC HISTORY:  # 2002- HEREDITARY HEMOCHROMATOSIS HOMOZYGOUS [goal ferritin 100-150]    # 2012- BMbx [sec to Anemia]-Normo-cellular; T Large Granular Lymphocytes by flowcytometry [4.5%] primary vs Reactive; FISH MDS- NEG  # Hx of Stroke [2012; no deficits]; CKD stage III [Dr.Lateef; ? Kidney Bx]     INTERVAL HISTORY: Alone.  Ambulating independently.  66 year old female patient with a prior history of homozygous hereditary hemochromatosis; recently diagnosed chronic kidney disease/anemia is here for follow-up.   Chronic mild fatigue no worsening shortness of breath or cough.  No blood in stools or black or stools.   Review of Systems  Constitutional:  Positive for malaise/fatigue. Negative for chills, diaphoresis, fever and weight loss.  HENT:  Negative for nosebleeds and sore throat.   Eyes:  Negative for double vision.  Respiratory:  Negative for cough, hemoptysis, sputum production, shortness of breath and wheezing.   Cardiovascular:  Negative for chest pain, palpitations, orthopnea and leg swelling.  Gastrointestinal:  Negative for abdominal pain, blood in stool, constipation, diarrhea, heartburn, melena, nausea and vomiting.  Genitourinary:  Negative for dysuria, frequency and urgency.  Musculoskeletal:  Positive for back pain. Negative for joint pain.  Skin: Negative.  Negative for itching and rash.  Neurological:  Negative for dizziness, tingling, focal weakness, weakness and headaches.  Endo/Heme/Allergies:  Does not bruise/bleed easily.  Psychiatric/Behavioral:  Negative for depression. The patient is not nervous/anxious and does not have insomnia.       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 2002   Hypertension    Hypothyroidism     PAST SURGICAL HISTORY :   Past Surgical History:  Procedure Laterality Date   BONE MARROW BIOPSY  01/26/2011   FLOW SHOWS POSITIVE FOR LGL'S, FISH IS NEG,   BREAST EXCISIONAL BIOPSY Right 2011   Negative   CHOLECYSTECTOMY  2013   COLONOSCOPY  07/2011   COLONOSCOPY N/A 06/24/2022   Procedure: COLONOSCOPY;  Surgeon: Annamaria Helling, DO;  Location: [facility] ENDOSCOPY;  Service: Gastroenterology;  Laterality: N/A;   ESOPHAGOGASTRODUODENOSCOPY  07/2011   ESOPHAGOGASTRODUODENOSCOPY N/A 06/24/2022   Procedure: ESOPHAGOGASTRODUODENOSCOPY (EGD);  Surgeon: Annamaria Helling, DO;  Location: [facility] ENDOSCOPY;  Service: Gastroenterology;  Laterality: N/A;   [place] OOPHERECTOMY Bilateral 11/01/2021   Procedure: LAPAROSCOPIC BILATERAL SALPINGO OOPHORECTOMY;  Surgeon: Schermerhorn, Gwen Her, MD;  Location: [facility] ORS;  Service: Gynecology;  Laterality: Bilateral;   NASAL SEPTUM SURGERY     TONSILLECTOMY  1971   TUBAL LIGATION  1995    FAMILY HISTORY :   Family History  Problem Relation Age of Onset   Ovarian cancer Mother        BRCA status reported negative   Cancer Mother    Heart disease Mother    Cancer -  Ovarian Mother    Cancer Father    Lung cancer Father    Breast cancer Paternal Aunt     SOCIAL HISTORY:   Social History   Tobacco Use   Smoking status: Never   Smokeless tobacco: Never  Vaping Use   Vaping Use: Never used  Substance Use Topics   Alcohol use: Yes    Comment: occassional wine   Drug use: No    ALLERGIES:  is  allergic to amlodipine, hydromorphone, and sulfa antibiotics.  MEDICATIONS:  Current Outpatient Medications  Medication Sig Dispense Refill   ALPRAZolam (XANAX) 0.25 MG tablet Take 0.25 mg by mouth daily as needed for anxiety.     aspirin 81 MG chewable tablet Chew 81 mg by mouth daily.     cholecalciferol (VITAMIN D3) 25 MCG (1000 UNIT) tablet Take 1,000 Units by mouth in the morning and at bedtime.     diclofenac Sodium (VOLTAREN) 1 % GEL Apply 1 application topically daily as needed (pain).     docusate sodium (COLACE) 100 MG capsule Take 100 mg by mouth daily as needed for mild constipation.     Fexofenadine HCl (ALLEGRA ALLERGY PO) Take by mouth.     folic acid (FOLVITE) 1 MG tablet Take 1 mg by mouth daily.     gabapentin (NEURONTIN) 300 MG capsule Take 300 mg by mouth 2 (two) times daily as needed (pain).     levothyroxine (SYNTHROID, LEVOTHROID) 50 MCG tablet Take 50 mcg by mouth daily before breakfast.     lisinopril (PRINIVIL,ZESTRIL) 20 MG tablet Take 20 mg by mouth daily.     omeprazole (PRILOSEC) 20 MG capsule Take 20 mg by mouth daily as needed (acid reflux).     OZEMPIC, 0.25 OR 0.5 MG/DOSE, 2 MG/3ML SOPN SMARTSIG:0.1875 Milliliter(s) SUB-Q Once a Week     venlafaxine XR (EFFEXOR-XR) 75 MG 24 hr capsule Take 75 mg by mouth in the morning and at bedtime.     cetirizine (ZYRTEC) 10 MG tablet Take 10 mg by mouth daily. (Patient not taking: Reported on 07/20/2022)     cyclobenzaprine (FLEXERIL) 10 MG tablet Take 10 mg by mouth at bedtime. (Patient not taking: Reported on 07/20/2022)     Melatonin 2.5 MG CHEW Chew by mouth. (Patient not taking: Reported on 07/20/2022)     zinc gluconate 50 MG tablet Take 50 mg by mouth daily. (Patient not taking: Reported on 07/20/2022)     No current facility-administered medications for this visit.    PHYSICAL EXAMINATION: ECOG PERFORMANCE STATUS: 0 - Asymptomatic  BP 116/78 (BP Location: Right Arm, Patient Position: Sitting)   Pulse 68   Temp 99.4  F (37.4 C) (Tympanic)   Resp 18   Wt 150 lb 9.6 oz (68.3 kg)   BMI 26.68 kg/m   Filed Weights   07/20/22 1500  Weight: 150 lb 9.6 oz (68.3 kg)    Physical Exam HENT:     Head: Normocephalic and atraumatic.     Mouth/Throat:     Pharynx: No oropharyngeal exudate.  Eyes:     Pupils: Pupils are equal, round, and reactive to light.  Cardiovascular:     Rate and Rhythm: Normal rate and regular rhythm.  Pulmonary:     Effort: No respiratory distress.     Breath sounds: No wheezing.  Abdominal:     General: Bowel sounds are normal. There is no distension.     Palpations: Abdomen is soft. There is no mass.     Tenderness:  There is no abdominal tenderness. There is no guarding or rebound.  Musculoskeletal:        General: No tenderness. Normal range of motion.     Cervical back: Normal range of motion and neck supple.  Skin:    General: Skin is warm.  Neurological:     Mental Status: She is alert and oriented to person, place, and time.  Psychiatric:        Mood and Affect: Affect normal.      LABORATORY DATA:  I have reviewed the data as listed    Component Value Date/Time   NA 136 07/20/2022 1447   NA 141 04/13/2018 1517   NA 142 05/19/2012 1957   K 5.1 07/20/2022 1447   K 4.6 05/19/2012 1957   CL 102 07/20/2022 1447   CL 108 (H) 05/19/2012 1957   CO2 26 07/20/2022 1447   CO2 26 05/19/2012 1957   GLUCOSE 89 07/20/2022 1447   GLUCOSE 94 05/19/2012 1957   BUN 33 (H) 07/20/2022 1447   BUN 34 (H) 04/13/2018 1517   BUN 10 05/19/2012 1957   CREATININE 1.43 (H) 07/20/2022 1447   CREATININE 1.09 05/19/2012 1957   CALCIUM 9.7 07/20/2022 1447   CALCIUM 9.0 05/19/2012 1957   PROT 7.4 05/11/2021 1109   PROT 7.3 04/13/2018 1517   PROT 6.2 (L) 05/19/2012 1957   ALBUMIN 4.4 05/11/2021 1109   ALBUMIN 4.9 (H) 04/13/2018 1517   ALBUMIN 3.4 05/19/2012 1957   AST 23 05/11/2021 1109   AST 31 05/19/2012 1957   ALT 20 05/11/2021 1109   ALT 28 05/19/2012 1957   ALKPHOS 57  05/11/2021 1109   ALKPHOS 61 05/19/2012 1957   BILITOT 0.5 05/11/2021 1109   BILITOT 0.3 04/13/2018 1517   BILITOT 0.3 05/19/2012 1957   GFRNONAA 40 (L) 07/20/2022 1447   GFRNONAA 56 (L) 05/19/2012 1957   GFRAA 58 (L) 04/27/2020 1035   GFRAA >60 05/19/2012 1957    No results found for: "SPEP", "UPEP"  Lab Results  Component Value Date   WBC 3.4 (L) 07/20/2022   NEUTROABS 1.9 07/20/2022   HGB 11.1 (L) 07/20/2022   HCT 33.0 (L) 07/20/2022   MCV 96.8 07/20/2022   PLT 264 07/20/2022      Chemistry      Component Value Date/Time   NA 136 07/20/2022 1447   NA 141 04/13/2018 1517   NA 142 05/19/2012 1957   K 5.1 07/20/2022 1447   K 4.6 05/19/2012 1957   CL 102 07/20/2022 1447   CL 108 (H) 05/19/2012 1957   CO2 26 07/20/2022 1447   CO2 26 05/19/2012 1957   BUN 33 (H) 07/20/2022 1447   BUN 34 (H) 04/13/2018 1517   BUN 10 05/19/2012 1957   CREATININE 1.43 (H) 07/20/2022 1447   CREATININE 1.09 05/19/2012 1957      Component Value Date/Time   CALCIUM 9.7 07/20/2022 1447   CALCIUM 9.0 05/19/2012 1957   ALKPHOS 57 05/11/2021 1109   ALKPHOS 61 05/19/2012 1957   AST 23 05/11/2021 1109   AST 31 05/19/2012 1957   ALT 20 05/11/2021 1109   ALT 28 05/19/2012 1957   BILITOT 0.5 05/11/2021 1109   BILITOT 0.3 04/13/2018 1517   BILITOT 0.3 05/19/2012 1957        ASSESSMENT & PLAN:   Normocytic anemia # worsening anemia-hemoglobin 8.3-9.0-likely secondary to CKD-III.   No evidence of iron deficiency-on Retacrit. [EGD/[facility]- 2023]  # Today hemoglobin is 11.6- improved.  Hold Retacrit  today.  STABLE; continue Retacrit every 3-6 months.  Not on oral iron/hemochromatosis.    #CKD-GFR in 34- creatinine 1.5 etiology unclear; October 2021 ultrasound kidneys negative; UA negative. ? NSIADs vs others.  No Kidney Bx- STABLE [Dr.Lateef].   # Hereditary hemochromatosis- homozygous [as per patient]-without organ dysfunction-especially with the ongoing anemia.  Monitor for now  #  DISPOSITION:  # HOLD RETACRIT  # Follow up in 6 months MD; labs- cbc/bmp/iron studies/ferritin-possible retacrit-Dr.B.   Cc; Dr.Bronstein    Cammie Sickle, MD 07/22/2022 7:46 PM

## 2022-07-22 ENCOUNTER — Encounter: Payer: Self-pay | Admitting: Internal Medicine

## 2022-08-22 DIAGNOSIS — I10 Essential (primary) hypertension: Secondary | ICD-10-CM | POA: Diagnosis not present

## 2022-08-22 DIAGNOSIS — D631 Anemia in chronic kidney disease: Secondary | ICD-10-CM | POA: Diagnosis not present

## 2022-08-22 DIAGNOSIS — N2581 Secondary hyperparathyroidism of renal origin: Secondary | ICD-10-CM | POA: Diagnosis not present

## 2022-08-22 DIAGNOSIS — N1832 Chronic kidney disease, stage 3b: Secondary | ICD-10-CM | POA: Diagnosis not present

## 2022-09-02 ENCOUNTER — Other Ambulatory Visit: Payer: Self-pay | Admitting: Obstetrics and Gynecology

## 2022-09-02 DIAGNOSIS — Z1231 Encounter for screening mammogram for malignant neoplasm of breast: Secondary | ICD-10-CM

## 2022-09-07 ENCOUNTER — Ambulatory Visit
Admission: RE | Admit: 2022-09-07 | Discharge: 2022-09-07 | Disposition: A | Discharge status: Home / Self Care | Payer: Medicare HMO | Source: Ambulatory Visit | Attending: Obstetrics and Gynecology | Admitting: Obstetrics and Gynecology

## 2022-09-07 DIAGNOSIS — Z1231 Encounter for screening mammogram for malignant neoplasm of breast: Secondary | ICD-10-CM | POA: Insufficient documentation

## 2022-11-16 DIAGNOSIS — E039 Hypothyroidism, unspecified: Secondary | ICD-10-CM | POA: Diagnosis not present

## 2022-11-16 DIAGNOSIS — I10 Essential (primary) hypertension: Secondary | ICD-10-CM | POA: Diagnosis not present

## 2022-11-16 DIAGNOSIS — E7800 Pure hypercholesterolemia, unspecified: Secondary | ICD-10-CM | POA: Diagnosis not present

## 2022-11-16 DIAGNOSIS — Z23 Encounter for immunization: Secondary | ICD-10-CM | POA: Diagnosis not present

## 2022-11-16 DIAGNOSIS — N289 Disorder of kidney and ureter, unspecified: Secondary | ICD-10-CM | POA: Diagnosis not present

## 2022-11-16 DIAGNOSIS — Z0000 Encounter for general adult medical examination without abnormal findings: Secondary | ICD-10-CM | POA: Diagnosis not present

## 2022-12-21 DIAGNOSIS — I10 Essential (primary) hypertension: Secondary | ICD-10-CM | POA: Diagnosis not present

## 2022-12-21 DIAGNOSIS — D631 Anemia in chronic kidney disease: Secondary | ICD-10-CM | POA: Diagnosis not present

## 2022-12-21 DIAGNOSIS — N2581 Secondary hyperparathyroidism of renal origin: Secondary | ICD-10-CM | POA: Diagnosis not present

## 2022-12-21 DIAGNOSIS — N1832 Chronic kidney disease, stage 3b: Secondary | ICD-10-CM | POA: Diagnosis not present

## 2022-12-29 DIAGNOSIS — H524 Presbyopia: Secondary | ICD-10-CM | POA: Diagnosis not present

## 2022-12-29 DIAGNOSIS — H5213 Myopia, bilateral: Secondary | ICD-10-CM | POA: Diagnosis not present

## 2022-12-29 DIAGNOSIS — H2513 Age-related nuclear cataract, bilateral: Secondary | ICD-10-CM | POA: Diagnosis not present

## 2023-01-20 ENCOUNTER — Inpatient Hospital Stay: Payer: Medicare HMO | Attending: Internal Medicine

## 2023-01-20 ENCOUNTER — Inpatient Hospital Stay: Payer: Medicare HMO

## 2023-01-20 ENCOUNTER — Inpatient Hospital Stay (HOSPITAL_BASED_OUTPATIENT_CLINIC_OR_DEPARTMENT_OTHER): Payer: Medicare HMO | Admitting: Internal Medicine

## 2023-01-20 ENCOUNTER — Encounter: Payer: Self-pay | Admitting: Internal Medicine

## 2023-01-20 DIAGNOSIS — N1832 Chronic kidney disease, stage 3b: Secondary | ICD-10-CM | POA: Insufficient documentation

## 2023-01-20 DIAGNOSIS — Z8673 Personal history of transient ischemic attack (TIA), and cerebral infarction without residual deficits: Secondary | ICD-10-CM | POA: Diagnosis not present

## 2023-01-20 DIAGNOSIS — D649 Anemia, unspecified: Secondary | ICD-10-CM

## 2023-01-20 LAB — BASIC METABOLIC PANEL
Anion gap: 10 (ref 5–15)
BUN: 27 mg/dL — ABNORMAL HIGH (ref 8–23)
CO2: 26 mmol/L (ref 22–32)
Calcium: 9.4 mg/dL (ref 8.9–10.3)
Chloride: 97 mmol/L — ABNORMAL LOW (ref 98–111)
Creatinine, Ser: 1.39 mg/dL — ABNORMAL HIGH (ref 0.44–1.00)
GFR, Estimated: 41 mL/min — ABNORMAL LOW (ref >60)
Glucose, Bld: 96 mg/dL (ref 70–99)
Potassium: 4.4 mmol/L (ref 3.5–5.1)
Sodium: 133 mmol/L — ABNORMAL LOW (ref 135–145)

## 2023-01-20 LAB — IRON AND TIBC
Iron: 182 ug/dL — ABNORMAL HIGH (ref 28–170)
Saturation Ratios: 54 % — ABNORMAL HIGH (ref 10.4–31.8)
TIBC: 339 ug/dL (ref 250–450)
UIBC: 157 ug/dL

## 2023-01-20 LAB — CBC WITH DIFFERENTIAL/PLATELET
Abs Immature Granulocytes: 0.01 10*3/uL (ref 0.00–0.07)
Basophils Absolute: 0 10*3/uL (ref 0.0–0.1)
Basophils Relative: 1 %
Eosinophils Absolute: 0.2 10*3/uL (ref 0.0–0.5)
Eosinophils Relative: 4 %
HCT: 33.3 % — ABNORMAL LOW (ref 36.0–46.0)
Hemoglobin: 11.2 g/dL — ABNORMAL LOW (ref 12.0–15.0)
Immature Granulocytes: 0 %
Lymphocytes Relative: 34 %
Lymphs Abs: 1.5 10*3/uL (ref 0.7–4.0)
MCH: 31.9 pg (ref 26.0–34.0)
MCHC: 33.6 g/dL (ref 30.0–36.0)
MCV: 94.9 fL (ref 80.0–100.0)
Monocytes Absolute: 0.5 10*3/uL (ref 0.1–1.0)
Monocytes Relative: 11 %
Neutro Abs: 2.3 10*3/uL (ref 1.7–7.7)
Neutrophils Relative %: 50 %
Platelets: 303 10*3/uL (ref 150–400)
RBC: 3.51 MIL/uL — ABNORMAL LOW (ref 3.87–5.11)
RDW: 12.7 % (ref 11.5–15.5)
WBC: 4.4 10*3/uL (ref 4.0–10.5)
nRBC: 0 % (ref 0.0–0.2)

## 2023-01-20 LAB — FERRITIN: Ferritin: 467 ng/mL — ABNORMAL HIGH (ref 11–307)

## 2023-01-20 NOTE — Addendum Note (Signed)
Addended by: Sofie Rower A on: 01/20/2023 03:33 PM   Modules accepted: Orders

## 2023-01-20 NOTE — Assessment & Plan Note (Addendum)
#   worsening anemia-hemoglobin 8.3-9.0-likely secondary to CKD-III.   No evidence of iron deficiency-on Retacrit. [EGD/[facility]- 2023]  # Today hemoglobin is 11.6 improved.  Hold Retacrit today.  Stable;  continue Retacrit every 3-6 months.  Not on oral iron/hemochromatosis.    #CKD-GFR in 41 etiology unclear; October 2021 ultrasound kidneys negative; UA negative. ? NSIADs vs others.  No Kidney Bx- stable;  [Dr.Lateef].   # Weight loss- s/p ozempic- off sec to insurance.   # Hereditary hemochromatosis- homozygous [as per patient]-without organ dysfunction-especially with the ongoing anemia.  Monitor for now  # DISPOSITION:  # HOLD RETACRIT  # Follow up in 6 months MD; labs- cbc/bmp/iron studies/ferritin-possible retacrit-Dr.B.   Cc; Dr.Bronstein

## 2023-01-20 NOTE — Progress Notes (Signed)
[facility] OFFICE PROGRESS NOTE  Patient Care Team: [name], MD as PCP - General (Family Medicine) [name], MD as Consulting Physician (Nephrology) Cammie Sickle, MD as Consulting Physician (Internal Medicine)   SUMMARY OF HEMATOLOGIC-ONCOLOGIC HISTORY:  # 2002- HEREDITARY HEMOCHROMATOSIS HOMOZYGOUS [goal ferritin 100-150]    # 2012- BMbx [sec to Anemia]-Normo-cellular; T Large Granular Lymphocytes by flowcytometry [4.5%] primary vs Reactive; FISH MDS- NEG  # Hx of Stroke [2012; no deficits]; CKD stage III [Dr.Lateef; ? Kidney Bx]     INTERVAL HISTORY: Alone.  Ambulating independently.  67 year old female patient with a prior history of homozygous hereditary hemochromatosis; recently diagnosed chronic kidney disease/anemia is here for follow-up.  Pt in for follow up, states she has stop taking several OTC and some prescription medications since last seen.  Pt states she has purposefully been trying to lose weight, was prescribed ozempic but not taking due to cost. no worsening shortness of breath or cough.  No blood in stools or black or stools.   Review of Systems  Constitutional:  Positive for malaise/fatigue. Negative for chills, diaphoresis, fever and weight loss.  HENT:  Negative for nosebleeds and sore throat.   Eyes:  Negative for double vision.  Respiratory:  Negative for cough, hemoptysis, sputum production, shortness of breath and wheezing.   Cardiovascular:  Negative for chest pain, palpitations, orthopnea and leg swelling.  Gastrointestinal:  Negative for abdominal pain, blood in stool, constipation, diarrhea, heartburn, melena, nausea and vomiting.  Genitourinary:  Negative for dysuria, frequency and urgency.  Musculoskeletal:  Positive for back pain. Negative for joint pain.  Skin: Negative.  Negative for itching and rash.  Neurological:  Negative for dizziness, tingling, focal weakness, weakness and headaches.   Endo/Heme/Allergies:  Does not bruise/bleed easily.  Psychiatric/Behavioral:  Negative for depression. The patient is not nervous/anxious and does not have insomnia.       PAST MEDICAL HISTORY :  Past Medical History:  Diagnosis Date   Abnormal CT scan, sinus    VASCULAR ABNORMALITY   Anemia    Anxiety    Back pain    Breast cyst 8+ years    HAD SURGICAL F/U WITH DR ELY    Chronic kidney disease, stage 3b (HCC)    Elevated ferritin level    Family history of adverse reaction to anesthesia    mother N/V   Hemochromatosis    Homozygous on genetic testing March 2002   Hypertension    Hypothyroidism     PAST SURGICAL HISTORY :   Past Surgical History:  Procedure Laterality Date   BONE MARROW BIOPSY  01/26/2011   FLOW SHOWS POSITIVE FOR LGL'S, FISH IS NEG,   BREAST EXCISIONAL BIOPSY Right 2011   Negative   CHOLECYSTECTOMY  2013   COLONOSCOPY  07/2011   COLONOSCOPY N/A 06/24/2022   Procedure: COLONOSCOPY;  Surgeon: Annamaria Helling, DO;  Location: [facility] ENDOSCOPY;  Service: Gastroenterology;  Laterality: N/A;   ESOPHAGOGASTRODUODENOSCOPY  07/2011   ESOPHAGOGASTRODUODENOSCOPY N/A 06/24/2022   Procedure: ESOPHAGOGASTRODUODENOSCOPY (EGD);  Surgeon: Annamaria Helling, DO;  Location: [facility] ENDOSCOPY;  Service: Gastroenterology;  Laterality: N/A;   [place] OOPHERECTOMY Bilateral 11/01/2021   Procedure: LAPAROSCOPIC BILATERAL SALPINGO OOPHORECTOMY;  Surgeon: Schermerhorn, Gwen Her, MD;  Location: [facility] ORS;  Service: Gynecology;  Laterality: Bilateral;   NASAL SEPTUM SURGERY     TONSILLECTOMY  1971   TUBAL LIGATION  1995    FAMILY HISTORY :   Family History  Problem Relation Age of Onset   Ovarian cancer Mother        BRCA status reported negative   Cancer Mother    Heart disease Mother    Cancer - Ovarian Mother    Cancer Father    Lung cancer Father    Breast cancer Paternal Aunt     SOCIAL HISTORY:   Social History    Tobacco Use   Smoking status: Never   Smokeless tobacco: Never  Vaping Use   Vaping Use: Never used  Substance Use Topics   Alcohol use: Yes    Comment: occassional wine   Drug use: No    ALLERGIES:  is allergic to amlodipine, hydromorphone, and sulfa antibiotics.  MEDICATIONS:  Current Outpatient Medications  Medication Sig Dispense Refill   aspirin 81 MG chewable tablet Chew 81 mg by mouth daily.     cholecalciferol (VITAMIN D3) 25 MCG (1000 UNIT) tablet Take 1,000 Units by mouth in the morning and at bedtime.     diclofenac Sodium (VOLTAREN) 1 % GEL Apply 1 application topically daily as needed (pain).     folic acid (FOLVITE) 1 MG tablet Take 1 mg by mouth daily.     levothyroxine (SYNTHROID, LEVOTHROID) 50 MCG tablet Take 50 mcg by mouth daily before breakfast.     lisinopril (PRINIVIL,ZESTRIL) 20 MG tablet Take 20 mg by mouth daily.     omeprazole (PRILOSEC) 20 MG capsule Take 20 mg by mouth daily as needed (acid reflux).     venlafaxine XR (EFFEXOR-XR) 75 MG 24 hr capsule Take 75 mg by mouth in the morning and at bedtime.     zinc gluconate 50 MG tablet Take 50 mg by mouth daily.     No current facility-administered medications for this visit.    PHYSICAL EXAMINATION: ECOG PERFORMANCE STATUS: 0 - Asymptomatic  BP 122/78 (BP Location: Left Arm, Patient Position: Sitting)   Pulse 62   Temp 97.8 F (36.6 C) (Tympanic)   Resp 16   Wt 133 lb (60.3 kg) Comment: pt has been trying to lose weight,  recently prescribed ozempic but states she is not taking it due to cost.  BMI 23.56 kg/m   Filed Weights   01/20/23 1447  Weight: 133 lb (60.3 kg)    Physical Exam HENT:     Head: Normocephalic and atraumatic.     Mouth/Throat:     Pharynx: No oropharyngeal exudate.  Eyes:     Pupils: Pupils are equal, round, and reactive to light.  Cardiovascular:     Rate and Rhythm: Normal rate and regular rhythm.  Pulmonary:     Effort: No respiratory distress.     Breath  sounds: No wheezing.  Abdominal:     General: Bowel sounds are normal. There is no distension.     Palpations: Abdomen is soft. There is no mass.     Tenderness: There is no abdominal tenderness. There is no guarding or rebound.  Musculoskeletal:        General: No tenderness. Normal range of motion.     Cervical back: Normal range of motion and neck supple.  Skin:    General: Skin is warm.  Neurological:     Mental Status: She is alert and oriented to person, place, and time.  Psychiatric:        Mood and Affect: Affect normal.      LABORATORY DATA:  I have reviewed the data as listed    Component Value Date/Time  NA 133 (L) 01/20/2023 1404   NA 141 04/13/2018 1517   NA 142 05/19/2012 1957   K 4.4 01/20/2023 1404   K 4.6 05/19/2012 1957   CL 97 (L) 01/20/2023 1404   CL 108 (H) 05/19/2012 1957   CO2 26 01/20/2023 1404   CO2 26 05/19/2012 1957   GLUCOSE 96 01/20/2023 1404   GLUCOSE 94 05/19/2012 1957   BUN 27 (H) 01/20/2023 1404   BUN 34 (H) 04/13/2018 1517   BUN 10 05/19/2012 1957   CREATININE 1.39 (H) 01/20/2023 1404   CREATININE 1.09 05/19/2012 1957   CALCIUM 9.4 01/20/2023 1404   CALCIUM 9.0 05/19/2012 1957   PROT 7.4 05/11/2021 1109   PROT 7.3 04/13/2018 1517   PROT 6.2 (L) 05/19/2012 1957   ALBUMIN 4.4 05/11/2021 1109   ALBUMIN 4.9 (H) 04/13/2018 1517   ALBUMIN 3.4 05/19/2012 1957   AST 23 05/11/2021 1109   AST 31 05/19/2012 1957   ALT 20 05/11/2021 1109   ALT 28 05/19/2012 1957   ALKPHOS 57 05/11/2021 1109   ALKPHOS 61 05/19/2012 1957   BILITOT 0.5 05/11/2021 1109   BILITOT 0.3 04/13/2018 1517   BILITOT 0.3 05/19/2012 1957   GFRNONAA 41 (L) 01/20/2023 1404   GFRNONAA 56 (L) 05/19/2012 1957   GFRAA 58 (L) 04/27/2020 1035   GFRAA >60 05/19/2012 1957    No results found for: "SPEP", "UPEP"  Lab Results  Component Value Date   WBC 4.4 01/20/2023   NEUTROABS 2.3 01/20/2023   HGB 11.2 (L) 01/20/2023   HCT 33.3 (L) 01/20/2023   MCV 94.9 01/20/2023    PLT 303 01/20/2023      Chemistry      Component Value Date/Time   NA 133 (L) 01/20/2023 1404   NA 141 04/13/2018 1517   NA 142 05/19/2012 1957   K 4.4 01/20/2023 1404   K 4.6 05/19/2012 1957   CL 97 (L) 01/20/2023 1404   CL 108 (H) 05/19/2012 1957   CO2 26 01/20/2023 1404   CO2 26 05/19/2012 1957   BUN 27 (H) 01/20/2023 1404   BUN 34 (H) 04/13/2018 1517   BUN 10 05/19/2012 1957   CREATININE 1.39 (H) 01/20/2023 1404   CREATININE 1.09 05/19/2012 1957      Component Value Date/Time   CALCIUM 9.4 01/20/2023 1404   CALCIUM 9.0 05/19/2012 1957   ALKPHOS 57 05/11/2021 1109   ALKPHOS 61 05/19/2012 1957   AST 23 05/11/2021 1109   AST 31 05/19/2012 1957   ALT 20 05/11/2021 1109   ALT 28 05/19/2012 1957   BILITOT 0.5 05/11/2021 1109   BILITOT 0.3 04/13/2018 1517   BILITOT 0.3 05/19/2012 1957        ASSESSMENT & PLAN:   Normocytic anemia # worsening anemia-hemoglobin 8.3-9.0-likely secondary to CKD-III.   No evidence of iron deficiency-on Retacrit. [EGD/[facility]- 2023]  # Today hemoglobin is 11.6 improved.  Hold Retacrit today.  Stable;  continue Retacrit every 3-6 months.  Not on oral iron/hemochromatosis.    #CKD-GFR in 41 etiology unclear; October 2021 ultrasound kidneys negative; UA negative. ? NSIADs vs others.  No Kidney Bx- stable;  [Dr.Lateef].   # Weight loss- s/p ozempic- off sec to insurance.   # Hereditary hemochromatosis- homozygous [as per patient]-without organ dysfunction-especially with the ongoing anemia.  Monitor for now  # DISPOSITION:  # HOLD RETACRIT  # Follow up in 6 months MD; labs- cbc/bmp/iron studies/ferritin-possible retacrit-Dr.B.   Cc; Dr.Bronstein    Cammie Sickle, MD 01/20/2023  3:19 PM

## 2023-01-20 NOTE — Progress Notes (Signed)
Pt in for follow up, states she has stop taking several OTC and some prescription medications since last seen.Pt states she has purposefully been trying to lose weight, was prescribed ozempic but not taking due to cost.

## 2023-03-22 DIAGNOSIS — Z0100 Encounter for examination of eyes and vision without abnormal findings: Secondary | ICD-10-CM | POA: Diagnosis not present

## 2023-04-12 DIAGNOSIS — N2581 Secondary hyperparathyroidism of renal origin: Secondary | ICD-10-CM | POA: Diagnosis not present

## 2023-04-12 DIAGNOSIS — D631 Anemia in chronic kidney disease: Secondary | ICD-10-CM | POA: Diagnosis not present

## 2023-04-12 DIAGNOSIS — N1832 Chronic kidney disease, stage 3b: Secondary | ICD-10-CM | POA: Diagnosis not present

## 2023-04-12 DIAGNOSIS — I10 Essential (primary) hypertension: Secondary | ICD-10-CM | POA: Diagnosis not present

## 2023-05-19 DIAGNOSIS — Z0000 Encounter for general adult medical examination without abnormal findings: Secondary | ICD-10-CM | POA: Diagnosis not present

## 2023-05-19 DIAGNOSIS — F419 Anxiety disorder, unspecified: Secondary | ICD-10-CM | POA: Diagnosis not present

## 2023-05-19 DIAGNOSIS — N289 Disorder of kidney and ureter, unspecified: Secondary | ICD-10-CM | POA: Diagnosis not present

## 2023-05-19 DIAGNOSIS — E039 Hypothyroidism, unspecified: Secondary | ICD-10-CM | POA: Diagnosis not present

## 2023-05-19 DIAGNOSIS — I10 Essential (primary) hypertension: Secondary | ICD-10-CM | POA: Diagnosis not present

## 2023-07-14 ENCOUNTER — Telehealth: Payer: Self-pay | Admitting: Internal Medicine

## 2023-07-14 NOTE — Telephone Encounter (Signed)
Pt called and wants to know if Dr.B can do the labs for Dr. Cherylann Ratel (kidney doctor/pt sees him in [place]) that way she only has to get stuck one time instead of two.  Please call her Mobile number to let her know if that can or can't be done  Thank you

## 2023-07-21 ENCOUNTER — Inpatient Hospital Stay: Payer: Medicare HMO

## 2023-07-21 ENCOUNTER — Inpatient Hospital Stay: Payer: Medicare HMO | Attending: Internal Medicine | Admitting: Internal Medicine

## 2023-07-21 DIAGNOSIS — N1832 Chronic kidney disease, stage 3b: Secondary | ICD-10-CM | POA: Diagnosis not present

## 2023-07-21 DIAGNOSIS — D631 Anemia in chronic kidney disease: Secondary | ICD-10-CM | POA: Insufficient documentation

## 2023-07-21 DIAGNOSIS — D649 Anemia, unspecified: Secondary | ICD-10-CM

## 2023-07-21 LAB — BASIC METABOLIC PANEL
Anion gap: 8 (ref 5–15)
BUN: 33 mg/dL — ABNORMAL HIGH (ref 8–23)
CO2: 24 mmol/L (ref 22–32)
Calcium: 9.8 mg/dL (ref 8.9–10.3)
Chloride: 104 mmol/L (ref 98–111)
Creatinine, Ser: 1.68 mg/dL — ABNORMAL HIGH (ref 0.44–1.00)
GFR, Estimated: 33 mL/min — ABNORMAL LOW (ref >60)
Glucose, Bld: 92 mg/dL (ref 70–99)
Potassium: 4.3 mmol/L (ref 3.5–5.1)
Sodium: 136 mmol/L (ref 135–145)

## 2023-07-21 LAB — CBC WITH DIFFERENTIAL/PLATELET
Abs Immature Granulocytes: 0.01 10*3/uL (ref 0.00–0.07)
Basophils Absolute: 0 10*3/uL (ref 0.0–0.1)
Basophils Relative: 1 %
Eosinophils Absolute: 0.2 10*3/uL (ref 0.0–0.5)
Eosinophils Relative: 5 %
HCT: 29.4 % — ABNORMAL LOW (ref 36.0–46.0)
Hemoglobin: 9.8 g/dL — ABNORMAL LOW (ref 12.0–15.0)
Immature Granulocytes: 0 %
Lymphocytes Relative: 31 %
Lymphs Abs: 1.1 10*3/uL (ref 0.7–4.0)
MCH: 31.8 pg (ref 26.0–34.0)
MCHC: 33.3 g/dL (ref 30.0–36.0)
MCV: 95.5 fL (ref 80.0–100.0)
Monocytes Absolute: 0.4 10*3/uL (ref 0.1–1.0)
Monocytes Relative: 10 %
Neutro Abs: 1.8 10*3/uL (ref 1.7–7.7)
Neutrophils Relative %: 53 %
Platelets: 249 10*3/uL (ref 150–400)
RBC: 3.08 MIL/uL — ABNORMAL LOW (ref 3.87–5.11)
RDW: 12.1 % (ref 11.5–15.5)
WBC: 3.4 10*3/uL — ABNORMAL LOW (ref 4.0–10.5)
nRBC: 0 % (ref 0.0–0.2)

## 2023-07-21 LAB — IRON AND TIBC
Iron: 209 ug/dL — ABNORMAL HIGH (ref 28–170)
Saturation Ratios: 66 % — ABNORMAL HIGH (ref 10.4–31.8)
TIBC: 315 ug/dL (ref 250–450)
UIBC: 106 ug/dL

## 2023-07-21 LAB — FERRITIN: Ferritin: 604 ng/mL — ABNORMAL HIGH (ref 11–307)

## 2023-07-21 MED ORDER — EPOETIN ALFA-EPBX 20000 UNIT/ML IJ SOLN
20000.0000 [IU] | Freq: Once | INTRAMUSCULAR | Status: AC
  Administered 2023-07-21: 20000 [IU] via SUBCUTANEOUS

## 2023-07-21 NOTE — Progress Notes (Signed)
[facility] OFFICE PROGRESS NOTE  Patient Care Team: Dorothey Baseman, MD as PCP - General (Family Medicine) Mady Haagensen, MD as Consulting Physician (Nephrology) Earna Coder, MD as Consulting Physician (Internal Medicine)   SUMMARY OF HEMATOLOGIC-ONCOLOGIC HISTORY:  # 2002- HEREDITARY HEMOCHROMATOSIS HOMOZYGOUS [goal ferritin 100-150]    # 2012- BMbx [sec to Anemia]-Normo-cellular; T Large Granular Lymphocytes by flowcytometry [4.5%] primary vs Reactive; FISH MDS- NEG  # Hx of Stroke [2012; no deficits]; CKD stage III [Dr.Lateef; ? Kidney Bx]     INTERVAL HISTORY: Alone.  Ambulating independently.  67 year-old female patient with a prior history of homozygous hereditary hemochromatosis; recently diagnosed chronic kidney disease/anemia is here for follow-up.  Currently complains of fatigue; and dizzy.   Pt states she has purposefully been trying to lose weight s/p ozempic [sec to cost issues].   Nno worsening shortness of breath or cough.  No blood in stools or black or stools.   Review of Systems  Constitutional:  Positive for malaise/fatigue. Negative for chills, diaphoresis, fever and weight loss.  HENT:  Negative for nosebleeds and sore throat.   Eyes:  Negative for double vision.  Respiratory:  Negative for cough, hemoptysis, sputum production, shortness of breath and wheezing.   Cardiovascular:  Negative for chest pain, palpitations, orthopnea and leg swelling.  Gastrointestinal:  Negative for abdominal pain, blood in stool, constipation, diarrhea, heartburn, melena, nausea and vomiting.  Genitourinary:  Negative for dysuria, frequency and urgency.  Musculoskeletal:  Positive for back pain. Negative for joint pain.  Skin: Negative.  Negative for itching and rash.  Neurological:  Negative for dizziness, tingling, focal weakness, weakness and headaches.  Endo/Heme/Allergies:  Does not bruise/bleed easily.  Psychiatric/Behavioral:  Negative for  depression. The patient is not nervous/anxious and does not have insomnia.       PAST MEDICAL HISTORY :  Past Medical History:  Diagnosis Date   Abnormal CT scan, sinus    VASCULAR ABNORMALITY   Anemia    Anxiety    Back pain    Breast cyst 8+ years    HAD SURGICAL F/U WITH DR ELY    Chronic kidney disease, stage 3b (HCC)    Elevated ferritin level    Family history of adverse reaction to anesthesia    mother N/V   Hemochromatosis    Homozygous on genetic testing March 2002   Hypertension    Hypothyroidism     PAST SURGICAL HISTORY :   Past Surgical History:  Procedure Laterality Date   BONE MARROW BIOPSY  01/26/2011   FLOW SHOWS POSITIVE FOR LGL'S, FISH IS NEG,   BREAST EXCISIONAL BIOPSY Right 2011   Negative   CHOLECYSTECTOMY  2013   COLONOSCOPY  07/2011   COLONOSCOPY N/A 06/24/2022   Procedure: COLONOSCOPY;  Surgeon: Jaynie Collins, DO;  Location: [facility] ENDOSCOPY;  Service: Gastroenterology;  Laterality: N/A;   ESOPHAGOGASTRODUODENOSCOPY  07/2011   ESOPHAGOGASTRODUODENOSCOPY N/A 06/24/2022   Procedure: ESOPHAGOGASTRODUODENOSCOPY (EGD);  Surgeon: Jaynie Collins, DO;  Location: [facility] ENDOSCOPY;  Service: Gastroenterology;  Laterality: N/A;   HYMENECTOMY  1973   LAPAROSCOPIC BILATERAL SALPINGO OOPHERECTOMY Bilateral 11/01/2021   Procedure: LAPAROSCOPIC BILATERAL SALPINGO OOPHORECTOMY;  Surgeon: Schermerhorn, Ihor Austin, MD;  Location: [facility] ORS;  Service: Gynecology;  Laterality: Bilateral;   NASAL SEPTUM SURGERY     TONSILLECTOMY  1971   TUBAL LIGATION  1995    FAMILY HISTORY :   Family History  Problem Relation Age of Onset   Ovarian cancer Mother  BRCA status reported negative   Cancer Mother    Heart disease Mother    Cancer - Ovarian Mother    Cancer Father    Lung cancer Father    Breast cancer Paternal Aunt     SOCIAL HISTORY:   Social History   Tobacco Use   Smoking status: Never   Smokeless tobacco: Never  Vaping Use   Vaping  status: Never Used  Substance Use Topics   Alcohol use: Yes    Comment: occassional wine   Drug use: No    ALLERGIES:  is allergic to amlodipine, hydromorphone, and sulfa antibiotics.  MEDICATIONS:  Current Outpatient Medications  Medication Sig Dispense Refill   aspirin 81 MG chewable tablet Chew 81 mg by mouth daily.     cholecalciferol (VITAMIN D3) 25 MCG (1000 UNIT) tablet Take 1,000 Units by mouth in the morning and at bedtime.     diclofenac Sodium (VOLTAREN) 1 % GEL Apply 1 application topically daily as needed (pain).     folic acid (FOLVITE) 1 MG tablet Take 1 mg by mouth daily.     levothyroxine (SYNTHROID, LEVOTHROID) 50 MCG tablet Take 50 mcg by mouth daily before breakfast.     lisinopril (PRINIVIL,ZESTRIL) 20 MG tablet Take 20 mg by mouth daily.     Magnesium Oxide -Mg Supplement ([employer] MAGNESIUM) 500 MG TABS Take by mouth at bedtime.     omeprazole (PRILOSEC) 20 MG capsule Take 20 mg by mouth daily as needed (acid reflux).     Turmeric (QC TUMERIC COMPLEX PO) Take by mouth daily.     venlafaxine XR (EFFEXOR-XR) 75 MG 24 hr capsule Take 75 mg by mouth in the morning and at bedtime.     zinc gluconate 50 MG tablet Take 50 mg by mouth daily.     No current facility-administered medications for this visit.    PHYSICAL EXAMINATION: ECOG PERFORMANCE STATUS: 0 - Asymptomatic  BP 119/71 (BP Location: Left Arm, Patient Position: Sitting, Cuff Size: Normal)   Pulse 79   Temp 98.5 F (36.9 C) (Tympanic)   Ht 5\' 3"  (1.6 m)   Wt 130 lb 3.2 oz (59.1 kg)   SpO2 99%   BMI 23.06 kg/m   Filed Weights   07/21/23 1423  Weight: 130 lb 3.2 oz (59.1 kg)     Physical Exam HENT:     Head: Normocephalic and atraumatic.     Mouth/Throat:     Pharynx: No oropharyngeal exudate.  Eyes:     Pupils: Pupils are equal, round, and reactive to light.  Cardiovascular:     Rate and Rhythm: Normal rate and regular rhythm.  Pulmonary:     Effort: No respiratory distress.     Breath  sounds: No wheezing.  Abdominal:     General: Bowel sounds are normal. There is no distension.     Palpations: Abdomen is soft. There is no mass.     Tenderness: There is no abdominal tenderness. There is no guarding or rebound.  Musculoskeletal:        General: No tenderness. Normal range of motion.     Cervical back: Normal range of motion and neck supple.  Skin:    General: Skin is warm.  Neurological:     Mental Status: She is alert and oriented to person, place, and time.  Psychiatric:        Mood and Affect: Affect normal.      LABORATORY DATA:  I have reviewed the data as listed  Component Value Date/Time   NA 136 07/21/2023 1419   NA 141 04/13/2018 1517   NA 142 05/19/2012 1957   K 4.3 07/21/2023 1419   K 4.6 05/19/2012 1957   CL 104 07/21/2023 1419   CL 108 (H) 05/19/2012 1957   CO2 24 07/21/2023 1419   CO2 26 05/19/2012 1957   GLUCOSE 92 07/21/2023 1419   GLUCOSE 94 05/19/2012 1957   BUN 33 (H) 07/21/2023 1419   BUN 34 (H) 04/13/2018 1517   BUN 10 05/19/2012 1957   CREATININE 1.68 (H) 07/21/2023 1419   CREATININE 1.09 05/19/2012 1957   CALCIUM 9.8 07/21/2023 1419   CALCIUM 9.0 05/19/2012 1957   PROT 7.4 05/11/2021 1109   PROT 7.3 04/13/2018 1517   PROT 6.2 (L) 05/19/2012 1957   ALBUMIN 4.4 05/11/2021 1109   ALBUMIN 4.9 (H) 04/13/2018 1517   ALBUMIN 3.4 05/19/2012 1957   AST 23 05/11/2021 1109   AST 31 05/19/2012 1957   ALT 20 05/11/2021 1109   ALT 28 05/19/2012 1957   ALKPHOS 57 05/11/2021 1109   ALKPHOS 61 05/19/2012 1957   BILITOT 0.5 05/11/2021 1109   BILITOT 0.3 04/13/2018 1517   BILITOT 0.3 05/19/2012 1957   GFRNONAA 33 (L) 07/21/2023 1419   GFRNONAA 56 (L) 05/19/2012 1957   GFRAA 58 (L) 04/27/2020 1035   GFRAA >60 05/19/2012 1957    No results found for: "SPEP", "UPEP"  Lab Results  Component Value Date   WBC 3.4 (L) 07/21/2023   NEUTROABS 1.8 07/21/2023   HGB 9.8 (L) 07/21/2023   HCT 29.4 (L) 07/21/2023   MCV 95.5 07/21/2023    PLT 249 07/21/2023      Chemistry      Component Value Date/Time   NA 136 07/21/2023 1419   NA 141 04/13/2018 1517   NA 142 05/19/2012 1957   K 4.3 07/21/2023 1419   K 4.6 05/19/2012 1957   CL 104 07/21/2023 1419   CL 108 (H) 05/19/2012 1957   CO2 24 07/21/2023 1419   CO2 26 05/19/2012 1957   BUN 33 (H) 07/21/2023 1419   BUN 34 (H) 04/13/2018 1517   BUN 10 05/19/2012 1957   CREATININE 1.68 (H) 07/21/2023 1419   CREATININE 1.09 05/19/2012 1957      Component Value Date/Time   CALCIUM 9.8 07/21/2023 1419   CALCIUM 9.0 05/19/2012 1957   ALKPHOS 57 05/11/2021 1109   ALKPHOS 61 05/19/2012 1957   AST 23 05/11/2021 1109   AST 31 05/19/2012 1957   ALT 20 05/11/2021 1109   ALT 28 05/19/2012 1957   BILITOT 0.5 05/11/2021 1109   BILITOT 0.3 04/13/2018 1517   BILITOT 0.3 05/19/2012 1957        ASSESSMENT & PLAN:   Normocytic anemia # worsening anemia-hemoglobin 8.3-9.0-likely secondary to CKD-III.   No evidence of iron deficiency-on Retacrit. [EGD/[facility]- 2023]  # Today hemoglobin is 9.8 improved. Proceed Retacrit today.  Continue Retacrit every 3-6 months.  Not on oral iron/hemochromatosis.    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  No Kidney Bx- stable;  [Dr.Lateef]- stable  # Weight loss- s/p ozempic- off sec to insurance.   # Hereditary hemochromatosis- homozygous [as per patient]-without organ dysfunction-especially with the ongoing anemia.  Monitor for now- stable.   # DISPOSITION:  # proceed with RETACRIT  # in 3 months- H&H- possible retacrit # Follow up in 6 months MD; labs- cbc/bmp/iron studies/ferritin-possible retacrit-Dr.B.   Cc; Dr.Bronstein  Earna Coder, MD 07/21/2023 3:20 PM

## 2023-07-21 NOTE — Assessment & Plan Note (Addendum)
#   worsening anemia-hemoglobin 8.3-9.0-likely secondary to CKD-III.   No evidence of iron deficiency-on Retacrit. [EGD/[facility]- 2023]  # Today hemoglobin is 9.8 improved. Proceed Retacrit today.  Continue Retacrit every 3-6 months.  Not on oral iron/hemochromatosis.    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  No Kidney Bx- stable;  [Dr.Lateef]- stable  # Weight loss- s/p ozempic- off sec to insurance.   # Hereditary hemochromatosis- homozygous [as per patient]-without organ dysfunction-especially with the ongoing anemia.  Monitor for now- stable.   # DISPOSITION:  # proceed with RETACRIT  # in 3 months- H&H- possible retacrit # Follow up in 6 months MD; labs- cbc/bmp/iron studies/ferritin-possible retacrit-Dr.B.   Cc; Dr.Bronstein

## 2023-07-21 NOTE — Progress Notes (Signed)
No concerns today 

## 2023-07-27 DIAGNOSIS — N1832 Chronic kidney disease, stage 3b: Secondary | ICD-10-CM | POA: Diagnosis not present

## 2023-07-27 DIAGNOSIS — I10 Essential (primary) hypertension: Secondary | ICD-10-CM | POA: Diagnosis not present

## 2023-07-27 DIAGNOSIS — D631 Anemia in chronic kidney disease: Secondary | ICD-10-CM | POA: Diagnosis not present

## 2023-07-27 DIAGNOSIS — N2581 Secondary hyperparathyroidism of renal origin: Secondary | ICD-10-CM | POA: Diagnosis not present

## 2023-08-16 DIAGNOSIS — S8991XA Unspecified injury of right lower leg, initial encounter: Secondary | ICD-10-CM | POA: Diagnosis not present

## 2023-08-16 DIAGNOSIS — S79912A Unspecified injury of left hip, initial encounter: Secondary | ICD-10-CM | POA: Diagnosis not present

## 2023-08-16 DIAGNOSIS — S8992XA Unspecified injury of left lower leg, initial encounter: Secondary | ICD-10-CM | POA: Diagnosis not present

## 2023-08-16 DIAGNOSIS — M160 Bilateral primary osteoarthritis of hip: Secondary | ICD-10-CM | POA: Diagnosis not present

## 2023-08-16 DIAGNOSIS — S8991XD Unspecified injury of right lower leg, subsequent encounter: Secondary | ICD-10-CM | POA: Diagnosis not present

## 2023-08-16 DIAGNOSIS — W010XXA Fall on same level from slipping, tripping and stumbling without subsequent striking against object, initial encounter: Secondary | ICD-10-CM | POA: Diagnosis not present

## 2023-08-16 DIAGNOSIS — S8992XD Unspecified injury of left lower leg, subsequent encounter: Secondary | ICD-10-CM | POA: Diagnosis not present

## 2023-10-20 ENCOUNTER — Inpatient Hospital Stay: Payer: Medicare HMO | Attending: Internal Medicine

## 2023-10-20 ENCOUNTER — Inpatient Hospital Stay: Payer: Medicare HMO

## 2023-10-20 DIAGNOSIS — N1832 Chronic kidney disease, stage 3b: Secondary | ICD-10-CM | POA: Diagnosis not present

## 2023-10-20 DIAGNOSIS — D631 Anemia in chronic kidney disease: Secondary | ICD-10-CM | POA: Diagnosis not present

## 2023-10-20 DIAGNOSIS — D649 Anemia, unspecified: Secondary | ICD-10-CM

## 2023-10-20 LAB — HEMOGLOBIN AND HEMATOCRIT (CANCER CENTER ONLY)
HCT: 26.5 % — ABNORMAL LOW (ref 36.0–46.0)
Hemoglobin: 9 g/dL — ABNORMAL LOW (ref 12.0–15.0)

## 2023-10-20 MED ORDER — EPOETIN ALFA-EPBX 20000 UNIT/ML IJ SOLN
20000.0000 [IU] | Freq: Once | INTRAMUSCULAR | Status: AC
  Administered 2023-10-20: 20000 [IU] via SUBCUTANEOUS
  Filled 2023-10-20: qty 1

## 2023-11-22 DIAGNOSIS — Z1231 Encounter for screening mammogram for malignant neoplasm of breast: Secondary | ICD-10-CM | POA: Diagnosis not present

## 2023-11-22 DIAGNOSIS — E039 Hypothyroidism, unspecified: Secondary | ICD-10-CM | POA: Diagnosis not present

## 2023-11-22 DIAGNOSIS — Z0000 Encounter for general adult medical examination without abnormal findings: Secondary | ICD-10-CM | POA: Diagnosis not present

## 2023-11-22 DIAGNOSIS — I10 Essential (primary) hypertension: Secondary | ICD-10-CM | POA: Diagnosis not present

## 2023-11-22 DIAGNOSIS — N289 Disorder of kidney and ureter, unspecified: Secondary | ICD-10-CM | POA: Diagnosis not present

## 2023-11-22 DIAGNOSIS — R7302 Impaired glucose tolerance (oral): Secondary | ICD-10-CM | POA: Diagnosis not present

## 2023-11-23 ENCOUNTER — Other Ambulatory Visit: Payer: Self-pay | Admitting: Family Medicine

## 2023-11-23 DIAGNOSIS — Z1231 Encounter for screening mammogram for malignant neoplasm of breast: Secondary | ICD-10-CM

## 2023-11-28 DIAGNOSIS — R7302 Impaired glucose tolerance (oral): Secondary | ICD-10-CM | POA: Diagnosis not present

## 2023-11-30 DIAGNOSIS — L280 Lichen simplex chronicus: Secondary | ICD-10-CM | POA: Diagnosis not present

## 2023-11-30 DIAGNOSIS — L821 Other seborrheic keratosis: Secondary | ICD-10-CM | POA: Diagnosis not present

## 2023-11-30 DIAGNOSIS — L570 Actinic keratosis: Secondary | ICD-10-CM | POA: Diagnosis not present

## 2023-11-30 DIAGNOSIS — D2272 Melanocytic nevi of left lower limb, including hip: Secondary | ICD-10-CM | POA: Diagnosis not present

## 2023-11-30 DIAGNOSIS — D2262 Melanocytic nevi of left upper limb, including shoulder: Secondary | ICD-10-CM | POA: Diagnosis not present

## 2023-11-30 DIAGNOSIS — D485 Neoplasm of uncertain behavior of skin: Secondary | ICD-10-CM | POA: Diagnosis not present

## 2023-11-30 DIAGNOSIS — D2271 Melanocytic nevi of right lower limb, including hip: Secondary | ICD-10-CM | POA: Diagnosis not present

## 2023-11-30 DIAGNOSIS — D225 Melanocytic nevi of trunk: Secondary | ICD-10-CM | POA: Diagnosis not present

## 2023-11-30 DIAGNOSIS — D2261 Melanocytic nevi of right upper limb, including shoulder: Secondary | ICD-10-CM | POA: Diagnosis not present

## 2023-11-30 DIAGNOSIS — L820 Inflamed seborrheic keratosis: Secondary | ICD-10-CM | POA: Diagnosis not present

## 2023-12-11 DIAGNOSIS — N1832 Chronic kidney disease, stage 3b: Secondary | ICD-10-CM | POA: Diagnosis not present

## 2023-12-11 DIAGNOSIS — N2581 Secondary hyperparathyroidism of renal origin: Secondary | ICD-10-CM | POA: Diagnosis not present

## 2023-12-11 DIAGNOSIS — D631 Anemia in chronic kidney disease: Secondary | ICD-10-CM | POA: Diagnosis not present

## 2023-12-11 DIAGNOSIS — I10 Essential (primary) hypertension: Secondary | ICD-10-CM | POA: Diagnosis not present

## 2023-12-13 ENCOUNTER — Encounter: Payer: Self-pay | Admitting: Internal Medicine

## 2023-12-14 ENCOUNTER — Ambulatory Visit
Admission: RE | Admit: 2023-12-14 | Discharge: 2023-12-14 | Disposition: A | Discharge status: Home / Self Care | Payer: HMO | Source: Ambulatory Visit | Attending: Family Medicine | Admitting: Family Medicine

## 2023-12-14 ENCOUNTER — Encounter: Payer: Self-pay | Admitting: Internal Medicine

## 2023-12-14 DIAGNOSIS — Z1231 Encounter for screening mammogram for malignant neoplasm of breast: Secondary | ICD-10-CM | POA: Diagnosis not present

## 2023-12-19 ENCOUNTER — Telehealth: Payer: Self-pay | Admitting: *Deleted

## 2023-12-19 NOTE — Telephone Encounter (Signed)
 Patient called reporting that she had Retacrit  11/8  and she just saw Dr Marcelino and her hgb is 8.8 and was told that she needs to get in touch with Dr B because it dropped, Please advise  Component Ref Range & Units (hover) 2 mo ago (10/20/23) 5 mo ago (07/21/23) 11 mo ago (01/20/23) 1 yr ago (07/20/22) 1 yr ago (03/29/22) 1 yr ago (12/29/21) 1 yr ago (12/22/21)  Hemoglobin 9.0 Low  9.8 Low  11.2 Low  11.1 Low  10.6 Low  10.6 Low  11.1  CBC and Differential Order: [id number] Component Ref Range & Units 8 d ago  WBC 3.8 - 10.8 Thousand/uL 3.6 Low   RBC 3.80 - 5.10 Million/uL 2.72 Low   Hemoglobin 11.7 - 15.5 g/dL 8.8 Low   Hematocrit 64.9 - 45.0 % 26 Low   MCV 80.0 - 100.0 fL 95.6  MCH 27.0 - 33.0 pg 32.4  MCHC 32.0 - 36.0 g/dL 66.1  Comment:      For adults, a slight decrease in the calculated MCHC      value (in the range of 30 to 32 g/dL) is most likely      not clinically significant; however, it should be      interpreted with caution in correlation with other      red cell parameters and the patient's clinical      condition.  RDW 11.0 - 15.0 % 12.8  Platelets 140 - 400 Thousand/uL 226  MPV 7.5 - 12.5 fL 11.5  Neutrophils Absolute 1500 - 7800 cells/uL 1,577  Band Neutrophils Absolute, Manual Count 0 - 750 cells/uL CANCELED  Comment: Result canceled by the ancillary.  Metamyelocytes Absolute 0 cells/uL CANCELED  Comment: Result canceled by the ancillary.  Absolute Myelocytes 0 cells/uL CANCELED  Comment: Result canceled by the ancillary.  Absolute Promyelocytes 0 cells/uL CANCELED  Comment: Result canceled by the ancillary.  Lymphocytes Absolute 850 - 3900 cells/uL 1,321  Monocytes Absolute 200 - 950 cells/uL 479  Eosinophils Absolute 15 - 500 cells/uL 202  Basophils Absolute 0 - 200 cells/uL 22  Blasts Absolute 0 cells/uL CANCELED  Comment: Result canceled by the ancillary.  NRBC Absolute 0 cells/uL CANCELED  Comment: Result canceled by the ancillary.   Neutrophils Relative % 43.8  Bands Absolute % CANCELED  Comment: Result canceled by the ancillary.  Metamyelocytes Percent % CANCELED  Comment: Result canceled by the ancillary.  Myelocytes Relative % CANCELED  Comment: Result canceled by the ancillary.  Promyelocytes Relative % CANCELED  Comment: Result canceled by the ancillary.  Lymphocytes % 36.7  Variant lymphocytes/100 WBC (Bld) 0 - 10 % CANCELED  Comment: Result canceled by the ancillary.  Monocytes % 13.3  Eosinophils % 5.6  Basophils Relative % 0.6  Blasts % CANCELED  Comment: Result canceled by the ancillary.  nRBC 0 /100 WBC CANCELED  Comment: Result canceled by the ancillary.  Comment(s) CANCELED  Comment: Result canceled by the ancillary.  Resulting Agency See order comments   Specimen Collected: 12/11/23 11:13   Performed by: ORLIN SPIEGEL Last Resulted: 12/12/23 10:11

## 2023-12-20 ENCOUNTER — Other Ambulatory Visit: Payer: Self-pay | Admitting: *Deleted

## 2023-12-20 DIAGNOSIS — D649 Anemia, unspecified: Secondary | ICD-10-CM

## 2023-12-21 ENCOUNTER — Inpatient Hospital Stay: Payer: PPO | Attending: Internal Medicine

## 2023-12-21 ENCOUNTER — Encounter: Payer: Self-pay | Admitting: Nurse Practitioner

## 2023-12-21 ENCOUNTER — Inpatient Hospital Stay: Payer: PPO

## 2023-12-21 ENCOUNTER — Inpatient Hospital Stay: Payer: PPO | Admitting: Nurse Practitioner

## 2023-12-21 VITALS — BP 112/78 | HR 72 | Temp 98.3°F | Wt 139.1 lb

## 2023-12-21 DIAGNOSIS — N1832 Chronic kidney disease, stage 3b: Secondary | ICD-10-CM | POA: Diagnosis not present

## 2023-12-21 DIAGNOSIS — D631 Anemia in chronic kidney disease: Secondary | ICD-10-CM

## 2023-12-21 DIAGNOSIS — D649 Anemia, unspecified: Secondary | ICD-10-CM

## 2023-12-21 LAB — CBC WITH DIFFERENTIAL (CANCER CENTER ONLY)
Abs Immature Granulocytes: 0 10*3/uL (ref 0.00–0.07)
Basophils Absolute: 0 10*3/uL (ref 0.0–0.1)
Basophils Relative: 1 %
Eosinophils Absolute: 0.2 10*3/uL (ref 0.0–0.5)
Eosinophils Relative: 6 %
HCT: 27.2 % — ABNORMAL LOW (ref 36.0–46.0)
Hemoglobin: 9.1 g/dL — ABNORMAL LOW (ref 12.0–15.0)
Immature Granulocytes: 0 %
Lymphocytes Relative: 34 %
Lymphs Abs: 1.1 10*3/uL (ref 0.7–4.0)
MCH: 32.3 pg (ref 26.0–34.0)
MCHC: 33.5 g/dL (ref 30.0–36.0)
MCV: 96.5 fL (ref 80.0–100.0)
Monocytes Absolute: 0.4 10*3/uL (ref 0.1–1.0)
Monocytes Relative: 12 %
Neutro Abs: 1.5 10*3/uL — ABNORMAL LOW (ref 1.7–7.7)
Neutrophils Relative %: 47 %
Platelet Count: 232 10*3/uL (ref 150–400)
RBC: 2.82 MIL/uL — ABNORMAL LOW (ref 3.87–5.11)
RDW: 13.1 % (ref 11.5–15.5)
WBC Count: 3.1 10*3/uL — ABNORMAL LOW (ref 4.0–10.5)
nRBC: 0 % (ref 0.0–0.2)

## 2023-12-21 MED ORDER — EPOETIN ALFA-EPBX 20000 UNIT/ML IJ SOLN
20000.0000 [IU] | Freq: Once | INTRAMUSCULAR | Status: AC
Start: 2023-12-21 — End: 2023-12-21
  Administered 2023-12-21: 20000 [IU] via SUBCUTANEOUS
  Filled 2023-12-21: qty 1

## 2023-12-21 NOTE — Progress Notes (Signed)
 [facility] OFF[facility] PROGRESS NOTE  Patient Care Team: Katie Lenis, MD as PCP - General (Family Medicine) Katie Gales, MD as Consulting Physician (Nephrology) Katie Cindy SAUNDERS, MD as Consulting Physician (Internal Medicine)   SUMMARY OF HEMATOLOGIC-ONCOLOGIC HISTORY:  # 2002- HEREDITARY HEMOCHROMATOSIS HOMOZYGOUS [goal ferritin 100-150]    # 2012- BMbx [sec to Anemia]-Normo-cellular; T Large Granular Lymphocytes by flowcytometry [4.5%] primary vs Reactive; FISH MDS- NEG  # Hx of Stroke [2012; no deficits]; CKD stage III [Dr.Lateef; ? Kidney Bx]     INTERVAL HISTORY: Alone.  Ambulating independently.  68 year-old female patient with a prior history of homozygous hereditary hemochromatosis;  diagnosed chronic kidney disease/anemia is here for follow-up. She was seen by Dr Katie and noted that hemoglobin had worsened so she has returned to clinic for sooner follow up. No worsening shortness of breath or cough.  No blood in stools or black or stools.   Review of Systems  Constitutional:  Positive for malaise/fatigue. Negative for chills, diaphoresis, fever and weight loss.  HENT:  Negative for nosebleeds and sore throat.   Eyes:  Negative for double vision.  Respiratory:  Negative for cough, hemoptysis, sputum production, shortness of breath and wheezing.   Cardiovascular:  Negative for chest pain, palpitations, orthopnea and leg swelling.  Gastrointestinal:  Negative for abdominal pain, blood in stool, constipation, diarrhea, heartburn, melena, nausea and vomiting.  Genitourinary:  Negative for dysuria, frequency and urgency.  Musculoskeletal:  Positive for back pain. Negative for joint pain.  Skin: Negative.  Negative for itching and rash.  Neurological:  Negative for dizziness, tingling, focal weakness, weakness and headaches.  Endo/Heme/Allergies:  Does not bruise/bleed easily.  Psychiatric/Behavioral:  Negative for depression. The patient is not  nervous/anxious and does not have insomnia.     PAST MEDICAL HISTORY :  Past Medical History:  Diagnosis Date   Abnormal CT scan, sinus    VASCULAR ABNORMALITY   Anemia    Anxiety    Back pain    Breast cyst 8+ years    HAD SURGICAL F/U WITH DR ELY    Chronic kidney disease, stage 3b (HCC)    Elevated ferritin level    Family history of adverse reaction to anesthesia    mother N/V   Hemochromatosis    Homozygous on genetic testing March 2002   Hypertension    Hypothyroidism     PAST SURGICAL HISTORY :   Past Surgical History:  Procedure Laterality Date   BONE MARROW BIOPSY  01/26/2011   FLOW SHOWS POSITIVE FOR LGL'S, FISH IS NEG,   BREAST EXCISIONAL BIOPSY Right 2011   Negative   CHOLECYSTECTOMY  2013   COLONOSCOPY  07/2011   COLONOSCOPY N/A 06/24/2022   Procedure: COLONOSCOPY;  Surgeon: Katie Elspeth Sharper, DO;  [facility];  Service: Gastroenterology;  Laterality: N/A;   ESOPHAGOGASTRODUODENOSCOPY  07/2011   ESOPHAGOGASTRODUODENOSCOPY N/A 06/24/2022   Procedure: ESOPHAGOGASTRODUODENOSCOPY (EGD);  Surgeon: Katie Elspeth Sharper, DO;  [facility];  Service: Gastroenterology;  Laterality: N/A;   HYMENECTOMY  1973   LAPAROSCOPIC BILATERAL SALPINGO OOPHERECTOMY Bilateral 11/01/2021   Procedure: LAPAROSCOPIC BILATERAL SALPINGO OOPHORECTOMY;  Surgeon: Schermerhorn, Debby PARAS, MD;  [facility];  Service: Gynecology;  Laterality: Bilateral;   NASAL SEPTUM SURGERY     TONSILLECTOMY  1971   TUBAL LIGATION  1995    FAMILY HISTORY :   Family History  Problem Relation Age of Onset   Ovarian cancer Mother        BRCA status  reported negative   Cancer Mother    Heart disease Mother    Cancer - Ovarian Mother    Cancer Father    Lung cancer Father    Breast cancer Paternal Aunt     SOCIAL HISTORY:   Social History   Tobacco Use   Smoking status: Never   Smokeless tobacco: Never  Vaping Use   Vaping status: Never Used  Substance Use  Topics   Alcohol use: Yes    Comment: occassional wine   Drug use: No    ALLERGIES:  is allergic to amlodipine, hydromorphone, and sulfa antibiotics.  MEDICATIONS:  Current Outpatient Medications  Medication Sig Dispense Refill   aspirin 81 MG chewable tablet Chew 81 mg by mouth daily.     cholecalciferol (VITAMIN D3) 25 MCG (1000 UNIT) tablet Take 1,000 Units by mouth in the morning and at bedtime.     diclofenac Sodium (VOLTAREN) 1 % GEL Apply 1 application topically daily as needed (pain).     folic acid  (FOLVITE ) 1 MG tablet Take 1 mg by mouth daily.     levothyroxine (SYNTHROID, LEVOTHROID) 50 MCG tablet Take 50 mcg by mouth daily before breakfast.     lisinopril (PRINIVIL,ZESTRIL) 20 MG tablet Take 20 mg by mouth daily.     Magnesium Oxide -Mg Supplement ([employer] MAGNESIUM) 500 MG TABS Take by mouth at bedtime.     omeprazole (PRILOSEC) 20 MG capsule Take 20 mg by mouth daily as needed (acid reflux).     Turmeric (QC TUMERIC COMPLEX PO) Take by mouth daily.     venlafaxine XR (EFFEXOR-XR) 75 MG 24 hr capsule Take 75 mg by mouth in the morning and at bedtime.     zinc gluconate 50 MG tablet Take 50 mg by mouth daily.     No current facility-administered medications for this visit.    PHYSICAL EXAMINATION: ECOG PERFORMANCE STATUS: 0 - Asymptomatic  BP 112/78 (BP Location: Left Arm, Patient Position: Sitting)   Pulse 72   Temp 98.3 F (36.8 C) (Tympanic)   Wt 139 lb 1.6 oz (63.1 kg)   SpO2 100%   BMI 24.64 kg/m   Filed Weights   12/21/23 0952  Weight: 139 lb 1.6 oz (63.1 kg)   Physical Exam Vitals reviewed.  Constitutional:      Appearance: She is not ill-appearing.  Pulmonary:     Effort: No respiratory distress.  Skin:    Coloration: Skin is not pale.  Neurological:     Mental Status: She is alert and oriented to person, place, and time.  Psychiatric:        Mood and Affect: Mood normal.        Behavior: Behavior normal.    LABORATORY DATA:  I have reviewed  the data as listed Lab Results  Component Value Date   WBC 3.1 (L) 12/21/2023   NEUTROABS 1.5 (L) 12/21/2023   HGB 9.1 (L) 12/21/2023   HCT 27.2 (L) 12/21/2023   MCV 96.5 12/21/2023   PLT 232 12/21/2023     Chemistry      Component Value Date/Time   NA 136 07/21/2023 1419   NA 141 04/13/2018 1517   NA 142 05/19/2012 1957   K 4.3 07/21/2023 1419   K 4.6 05/19/2012 1957   CL 104 07/21/2023 1419   CL 108 (H) 05/19/2012 1957   CO2 24 07/21/2023 1419   CO2 26 05/19/2012 1957   BUN 33 (H) 07/21/2023 1419   BUN 34 (H) 04/13/2018 1517  BUN 10 05/19/2012 1957   CREATININE 1.68 (H) 07/21/2023 1419   CREATININE 1.09 05/19/2012 1957      Component Value Date/Time   CALCIUM 9.8 07/21/2023 1419   CALCIUM 9.0 05/19/2012 1957   ALKPHOS 57 05/11/2021 1109   ALKPHOS 61 05/19/2012 1957   AST 23 05/11/2021 1109   AST 31 05/19/2012 1957   ALT 20 05/11/2021 1109   ALT 28 05/19/2012 1957   BILITOT 0.5 05/11/2021 1109   BILITOT 0.3 04/13/2018 1517   BILITOT 0.3 05/19/2012 1957     Iron/TIBC/Ferritin/ %Sat    Component Value Date/Time   IRON 209 (H) 07/21/2023 1419   IRON 195 (H) 04/13/2018 1517   TIBC 315 07/21/2023 1419   TIBC 286 04/13/2018 1517   FERRITIN 604 (H) 07/21/2023 1419   FERRITIN 225 (H) 04/13/2018 1517   FERRITIN 63 04/23/2012 1535   IRONPCTSAT 66 (H) 07/21/2023 1419   IRONPCTSAT 68 (H) 04/13/2018 1517     ASSESSMENT & PLAN:   # Normocytic anemia- worsening anemia-hemoglobin 8.3-9.0- likely secondary to CKD-III.   No evidence of iron deficiency-on Retacrit . [EGD/[facility]- 2023]   # Hmg had worsened with nephrology, hmg down to 8.8. Today hemoglobin is 9.1- roughly stable. Proceed Retacrit  today.  Continue Retacrit  every 3-6 months.  Not on oral iron due to history of hemochromatosis.     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  No Kidney Bx- stable;  [Dr.Lateef]- More recently gfr worsened to 35, CKD stage IIIb.     # Weight loss- s/p ozempic- off sec to insurance.    # Hereditary hemochromatosis- homozygous [as per patient]-without organ dysfunction- especially with the ongoing anemia.  Monitor for now- stable.    DISPOSITION:  # proceed with RETACRIT   # 1 month- labs (cbc, bmp, ferritin, iron studies), Dr Katie, +/- retacrit - [place]   No problem-specific Assessment & Plan notes found for this encounter.  Katie KANDICE Dawn, NP 12/21/2023   CC: Dr.Bronstein, Dr. Lateef

## 2024-01-15 ENCOUNTER — Inpatient Hospital Stay: Payer: PPO | Attending: Internal Medicine

## 2024-01-15 ENCOUNTER — Encounter: Payer: Self-pay | Admitting: Internal Medicine

## 2024-01-15 DIAGNOSIS — D631 Anemia in chronic kidney disease: Secondary | ICD-10-CM | POA: Diagnosis not present

## 2024-01-15 DIAGNOSIS — D649 Anemia, unspecified: Secondary | ICD-10-CM

## 2024-01-15 DIAGNOSIS — N1832 Chronic kidney disease, stage 3b: Secondary | ICD-10-CM | POA: Insufficient documentation

## 2024-01-15 LAB — FERRITIN: Ferritin: 546 ng/mL — ABNORMAL HIGH (ref 11–307)

## 2024-01-15 LAB — CBC WITH DIFFERENTIAL (CANCER CENTER ONLY)
Abs Immature Granulocytes: 0.01 10*3/uL (ref 0.00–0.07)
Basophils Absolute: 0 10*3/uL (ref 0.0–0.1)
Basophils Relative: 1 %
Eosinophils Absolute: 0.2 10*3/uL (ref 0.0–0.5)
Eosinophils Relative: 4 %
HCT: 33.6 % — ABNORMAL LOW (ref 36.0–46.0)
Hemoglobin: 11.3 g/dL — ABNORMAL LOW (ref 12.0–15.0)
Immature Granulocytes: 0 %
Lymphocytes Relative: 36 %
Lymphs Abs: 1.3 10*3/uL (ref 0.7–4.0)
MCH: 32.8 pg (ref 26.0–34.0)
MCHC: 33.6 g/dL (ref 30.0–36.0)
MCV: 97.4 fL (ref 80.0–100.0)
Monocytes Absolute: 0.4 10*3/uL (ref 0.1–1.0)
Monocytes Relative: 11 %
Neutro Abs: 1.7 10*3/uL (ref 1.7–7.7)
Neutrophils Relative %: 48 %
Platelet Count: 237 10*3/uL (ref 150–400)
RBC: 3.45 MIL/uL — ABNORMAL LOW (ref 3.87–5.11)
RDW: 13.5 % (ref 11.5–15.5)
WBC Count: 3.6 10*3/uL — ABNORMAL LOW (ref 4.0–10.5)
nRBC: 0 % (ref 0.0–0.2)

## 2024-01-15 LAB — BASIC METABOLIC PANEL
Anion gap: 10 (ref 5–15)
BUN: 45 mg/dL — ABNORMAL HIGH (ref 8–23)
CO2: 24 mmol/L (ref 22–32)
Calcium: 9.7 mg/dL (ref 8.9–10.3)
Chloride: 102 mmol/L (ref 98–111)
Creatinine, Ser: 1.64 mg/dL — ABNORMAL HIGH (ref 0.44–1.00)
GFR, Estimated: 34 mL/min — ABNORMAL LOW (ref >60)
Glucose, Bld: 96 mg/dL (ref 70–99)
Potassium: 5 mmol/L (ref 3.5–5.1)
Sodium: 136 mmol/L (ref 135–145)

## 2024-01-15 LAB — IRON AND TIBC
Iron: 261 ug/dL — ABNORMAL HIGH (ref 28–170)
Saturation Ratios: 88 % — ABNORMAL HIGH (ref 10.4–31.8)
TIBC: 297 ug/dL (ref 250–450)
UIBC: 36 ug/dL

## 2024-01-19 ENCOUNTER — Other Ambulatory Visit: Payer: Self-pay | Admitting: *Deleted

## 2024-01-19 DIAGNOSIS — D631 Anemia in chronic kidney disease: Secondary | ICD-10-CM

## 2024-01-22 ENCOUNTER — Inpatient Hospital Stay: Payer: PPO

## 2024-01-22 ENCOUNTER — Other Ambulatory Visit: Payer: Medicare HMO

## 2024-01-22 ENCOUNTER — Inpatient Hospital Stay: Payer: PPO | Admitting: Internal Medicine

## 2024-01-23 ENCOUNTER — Encounter: Payer: Self-pay | Admitting: Nurse Practitioner

## 2024-01-23 ENCOUNTER — Inpatient Hospital Stay: Payer: PPO

## 2024-01-23 ENCOUNTER — Other Ambulatory Visit: Payer: Self-pay

## 2024-01-23 ENCOUNTER — Inpatient Hospital Stay (HOSPITAL_BASED_OUTPATIENT_CLINIC_OR_DEPARTMENT_OTHER): Payer: PPO | Admitting: Nurse Practitioner

## 2024-01-23 VITALS — BP 114/76 | HR 65 | Temp 98.4°F | Wt 137.0 lb

## 2024-01-23 DIAGNOSIS — D631 Anemia in chronic kidney disease: Secondary | ICD-10-CM

## 2024-01-23 DIAGNOSIS — N1832 Chronic kidney disease, stage 3b: Secondary | ICD-10-CM

## 2024-01-23 LAB — CBC WITH DIFFERENTIAL (CANCER CENTER ONLY)
Abs Immature Granulocytes: 0.02 10*3/uL (ref 0.00–0.07)
Basophils Absolute: 0 10*3/uL (ref 0.0–0.1)
Basophils Relative: 1 %
Eosinophils Absolute: 0.2 10*3/uL (ref 0.0–0.5)
Eosinophils Relative: 5 %
HCT: 33.1 % — ABNORMAL LOW (ref 36.0–46.0)
Hemoglobin: 11.1 g/dL — ABNORMAL LOW (ref 12.0–15.0)
Immature Granulocytes: 1 %
Lymphocytes Relative: 37 %
Lymphs Abs: 1.5 10*3/uL (ref 0.7–4.0)
MCH: 32.6 pg (ref 26.0–34.0)
MCHC: 33.5 g/dL (ref 30.0–36.0)
MCV: 97.4 fL (ref 80.0–100.0)
Monocytes Absolute: 0.4 10*3/uL (ref 0.1–1.0)
Monocytes Relative: 9 %
Neutro Abs: 2 10*3/uL (ref 1.7–7.7)
Neutrophils Relative %: 47 %
Platelet Count: 303 10*3/uL (ref 150–400)
RBC: 3.4 MIL/uL — ABNORMAL LOW (ref 3.87–5.11)
RDW: 13.2 % (ref 11.5–15.5)
WBC Count: 4.1 10*3/uL (ref 4.0–10.5)
nRBC: 0 % (ref 0.0–0.2)

## 2024-01-23 LAB — BASIC METABOLIC PANEL - CANCER CENTER ONLY
Anion gap: 9 (ref 5–15)
BUN: 38 mg/dL — ABNORMAL HIGH (ref 8–23)
CO2: 25 mmol/L (ref 22–32)
Calcium: 9.8 mg/dL (ref 8.9–10.3)
Chloride: 102 mmol/L (ref 98–111)
Creatinine: 1.75 mg/dL — ABNORMAL HIGH (ref 0.44–1.00)
GFR, Estimated: 31 mL/min — ABNORMAL LOW (ref >60)
Glucose, Bld: 71 mg/dL (ref 70–99)
Potassium: 4.7 mmol/L (ref 3.5–5.1)
Sodium: 136 mmol/L (ref 135–145)

## 2024-01-23 LAB — IRON AND TIBC
Iron: 261 ug/dL — ABNORMAL HIGH (ref 28–170)
Saturation Ratios: 84 % — ABNORMAL HIGH (ref 10.4–31.8)
TIBC: 309 ug/dL (ref 250–450)
UIBC: 48 ug/dL

## 2024-01-23 LAB — FERRITIN: Ferritin: 579 ng/mL — ABNORMAL HIGH (ref 11–307)

## 2024-01-23 NOTE — Progress Notes (Signed)
Hgb is 11.1; will hold retacrit

## 2024-01-23 NOTE — Progress Notes (Signed)
 [name] OFFICE PROGRESS NOTE  Patient Care Team: Dorothey Baseman, MD as PCP - General (Family Medicine) Mady Haagensen, MD as Consulting Physician (Nephrology) Earna Coder, MD as Consulting Physician (Internal Medicine)   SUMMARY OF HEMATOLOGIC-ONCOLOGIC HISTORY:  # 2002- HEREDITARY HEMOCHROMATOSIS HOMOZYGOUS [goal ferritin 100-150].  # 2012- BMbx [sec to Anemia]-Normo-cellular; T Large Granular Lymphocytes by flowcytometry [4.5%] primary vs Reactive; FISH MDS- NEG  # Hx of Stroke [2012; no deficits]; CKD stage III [Dr.Lateef; ? Kidney Bx]     INTERVAL HISTORY: Ambulating independently.  [facility] 68 y.o. female patient with prior history of homozygous hereditary hemochromatosis, with CKD and anemia, who returns to clinic for follow up. Previously, her hemoglobin had worsened to 8.8 with nephrology. Improved to 9.1 on recheck. She has received retacrit. She does not take oral iron d/t history of hemochromatosis.   Review of Systems  Constitutional:  Positive for malaise/fatigue. Negative for chills, diaphoresis, fever and weight loss.  HENT:  Negative for nosebleeds and sore throat.   Eyes:  Negative for double vision.  Respiratory:  Negative for cough, hemoptysis, sputum production, shortness of breath and wheezing.   Cardiovascular:  Negative for chest pain, palpitations, orthopnea and leg swelling.  Gastrointestinal:  Negative for abdominal pain, blood in stool, constipation, diarrhea, heartburn, melena, nausea and vomiting.  Genitourinary:  Negative for dysuria, frequency and urgency.  Musculoskeletal:  Positive for back pain. Negative for joint pain.  Skin: Negative.  Negative for itching and rash.  Neurological:  Negative for dizziness, tingling, focal weakness, weakness and headaches.  Endo/Heme/Allergies:  Does not bruise/bleed easily.  Psychiatric/Behavioral:  Negative for depression. The patient is not nervous/anxious and does not have  insomnia.     PAST MEDICAL HISTORY :  Past Medical History:  Diagnosis Date   Abnormal CT scan, sinus    VASCULAR ABNORMALITY   Anemia    Anxiety    Back pain    Breast cyst 8+ years    HAD SURGICAL F/U WITH DR ELY    Chronic kidney disease, stage 3b (HCC)    Elevated ferritin level    Family history of adverse reaction to anesthesia    mother N/V   Hemochromatosis    Homozygous on genetic testing March 2002   Hypertension    Hypothyroidism     PAST SURGICAL HISTORY :   Past Surgical History:  Procedure Laterality Date   BONE MARROW BIOPSY  01/26/2011   FLOW SHOWS POSITIVE FOR LGL'S, FISH IS NEG,   BREAST EXCISIONAL BIOPSY Right 2011   Negative   CHOLECYSTECTOMY  2013   COLONOSCOPY  07/2011   COLONOSCOPY N/A 06/24/2022   Procedure: COLONOSCOPY;  Surgeon: Jaynie Collins, DO;  Location: [facility] ENDOSCOPY;  Service: Gastroenterology;  Laterality: N/A;   ESOPHAGOGASTRODUODENOSCOPY  07/2011   ESOPHAGOGASTRODUODENOSCOPY N/A 06/24/2022   Procedure: ESOPHAGOGASTRODUODENOSCOPY (EGD);  Surgeon: Jaynie Collins, DO;  Location: [facility] ENDOSCOPY;  Service: Gastroenterology;  Laterality: N/A;   HYMENECTOMY  1973   LAPAROSCOPIC BILATERAL SALPINGO OOPHERECTOMY Bilateral 11/01/2021   Procedure: LAPAROSCOPIC BILATERAL SALPINGO OOPHORECTOMY;  Surgeon: Schermerhorn, Ihor Austin, MD;  Location: [facility] ORS;  Service: Gynecology;  Laterality: Bilateral;   NASAL SEPTUM SURGERY     TONSILLECTOMY  1971   TUBAL LIGATION  1995    FAMILY HISTORY :   Family History  Problem Relation Age of Onset   Ovarian cancer Mother        BRCA status reported negative   Cancer Mother    Heart  disease Mother    Cancer - Ovarian Mother    Cancer Father    Lung cancer Father    Breast cancer Paternal Aunt     SOCIAL HISTORY:   Social History   Tobacco Use   Smoking status: Never   Smokeless tobacco: Never  Vaping Use   Vaping status: Never Used  Substance Use Topics   Alcohol use: Yes     Comment: occassional wine   Drug use: No    ALLERGIES:  is allergic to amlodipine, hydromorphone, and sulfa antibiotics.  MEDICATIONS:  Current Outpatient Medications  Medication Sig Dispense Refill   aspirin 81 MG chewable tablet Chew 81 mg by mouth daily.     cholecalciferol (VITAMIN D3) 25 MCG (1000 UNIT) tablet Take 1,000 Units by mouth in the morning and at bedtime.     diclofenac Sodium (VOLTAREN) 1 % GEL Apply 1 application topically daily as needed (pain).     folic acid (FOLVITE) 1 MG tablet Take 1 mg by mouth daily.     levothyroxine (SYNTHROID, LEVOTHROID) 50 MCG tablet Take 50 mcg by mouth daily before breakfast.     lisinopril (PRINIVIL,ZESTRIL) 20 MG tablet Take 20 mg by mouth daily.     Magnesium Oxide -Mg Supplement ([employer] MAGNESIUM) 500 MG TABS Take by mouth at bedtime.     omeprazole (PRILOSEC) 20 MG capsule Take 20 mg by mouth daily as needed (acid reflux).     Semaglutide,0.25 or 0.5MG /DOS, 2 MG/3ML SOPN Inject into the skin.     Turmeric (QC TUMERIC COMPLEX PO) Take by mouth daily.     venlafaxine XR (EFFEXOR-XR) 75 MG 24 hr capsule Take 75 mg by mouth in the morning and at bedtime.     zinc gluconate 50 MG tablet Take 50 mg by mouth daily.     No current facility-administered medications for this visit.    PHYSICAL EXAMINATION: ECOG PERFORMANCE STATUS: 0 - Asymptomatic  BP 114/76 (BP Location: Left Arm, Patient Position: Sitting, Cuff Size: Normal)   Pulse 65   Temp 98.4 F (36.9 C) (Tympanic)   Wt 137 lb (62.1 kg)   SpO2 99%   BMI 24.27 kg/m   Filed Weights   01/23/24 1338  Weight: 137 lb (62.1 kg)   Physical Exam Vitals reviewed.  Constitutional:      Appearance: She is not ill-appearing.  Pulmonary:     Effort: No respiratory distress.  Skin:    Coloration: Skin is not pale.  Neurological:     Mental Status: She is alert and oriented to person, place, and time.  Psychiatric:        Mood and Affect: Mood normal.        Behavior: Behavior  normal.    LABORATORY DATA:  I have reviewed the data as listed Lab Results  Component Value Date   WBC 4.1 01/23/2024   NEUTROABS 2.0 01/23/2024   HGB 11.1 (L) 01/23/2024   HCT 33.1 (L) 01/23/2024   MCV 97.4 01/23/2024   PLT 303 01/23/2024     Chemistry      Component Value Date/Time   NA 136 01/15/2024 1514   NA 141 04/13/2018 1517   NA 142 05/19/2012 1957   K 5.0 01/15/2024 1514   K 4.6 05/19/2012 1957   CL 102 01/15/2024 1514   CL 108 (H) 05/19/2012 1957   CO2 24 01/15/2024 1514   CO2 26 05/19/2012 1957   BUN 45 (H) 01/15/2024 1514   BUN 34 (H) 04/13/2018  1517   BUN 10 05/19/2012 1957   CREATININE 1.64 (H) 01/15/2024 1514   CREATININE 1.09 05/19/2012 1957      Component Value Date/Time   CALCIUM 9.7 01/15/2024 1514   CALCIUM 9.0 05/19/2012 1957   ALKPHOS 57 05/11/2021 1109   ALKPHOS 61 05/19/2012 1957   AST 23 05/11/2021 1109   AST 31 05/19/2012 1957   ALT 20 05/11/2021 1109   ALT 28 05/19/2012 1957   BILITOT 0.5 05/11/2021 1109   BILITOT 0.3 04/13/2018 1517   BILITOT 0.3 05/19/2012 1957     Iron/TIBC/Ferritin/ %Sat    Component Value Date/Time   IRON 261 (H) 01/15/2024 1514   IRON 195 (H) 04/13/2018 1517   TIBC 297 01/15/2024 1514   TIBC 286 04/13/2018 1517   FERRITIN 546 (H) 01/15/2024 1514   FERRITIN 225 (H) 04/13/2018 1517   FERRITIN 63 04/23/2012 1535   IRONPCTSAT 88 (H) 01/15/2024 1514   IRONPCTSAT 68 (H) 04/13/2018 1517     ASSESSMENT & PLAN:   # Normocytic anemia- worsening anemia-hemoglobin 8.3-9.0- likely secondary to CKD-III.   No evidence of iron deficiency-on Retacrit. [EGD/[facility]- 2023]   # Hmg had worsened with nephrology, hmg down to 8.8 then 9.1. s/p Retacrit. Today, improved to 11.1. Hold retacrit. Continue retacrit every 3 months for hemoglobin < 10. Not on oral iron due to history of hemochromatosis.     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  No Kidney Bx- stable;   [Dr.Lateef]- More recently gfr worsened to 35, CKD stage IIIb.    # Weight loss- s/p ozempic- back on ozempic.    # Hereditary hemochromatosis- homozygous [as per patient]-without organ dysfunction- especially with the ongoing anemia.  Monitor for now- stable.    DISPOSITION:  No retacrit today 3 mo- lab (H&H), +/- retacrit 6 mo- lab (cbc, ferritin, iron studies), Dr Donneta Romberg, +/- retacrit- la   No problem-specific Assessment & Plan notes found for this encounter.  Alinda Dooms, NP 01/23/2024   CC: Dr.Bronstein, Dr. Cherylann Ratel

## 2024-02-27 ENCOUNTER — Ambulatory Visit (INDEPENDENT_AMBULATORY_CARE_PROVIDER_SITE_OTHER)

## 2024-02-27 ENCOUNTER — Encounter: Payer: Self-pay | Admitting: Emergency Medicine

## 2024-02-27 ENCOUNTER — Ambulatory Visit
Admission: EM | Admit: 2024-02-27 | Discharge: 2024-02-27 | Disposition: A | Discharge status: Home / Self Care | Attending: Emergency Medicine | Admitting: Emergency Medicine

## 2024-02-27 DIAGNOSIS — J069 Acute upper respiratory infection, unspecified: Secondary | ICD-10-CM | POA: Insufficient documentation

## 2024-02-27 DIAGNOSIS — R051 Acute cough: Secondary | ICD-10-CM

## 2024-02-27 DIAGNOSIS — R059 Cough, unspecified: Secondary | ICD-10-CM | POA: Diagnosis not present

## 2024-02-27 DIAGNOSIS — R0981 Nasal congestion: Secondary | ICD-10-CM | POA: Diagnosis not present

## 2024-02-27 LAB — GROUP A STREP BY PCR: Group A Strep by PCR: NOT DETECTED

## 2024-02-27 MED ORDER — IPRATROPIUM BROMIDE 0.06 % NA SOLN: 2.0000 | Freq: Four times a day (QID) | NASAL | 12 refills | Status: DC

## 2024-02-27 MED ORDER — BENZONATATE 100 MG PO CAPS: 200.0000 mg | ORAL_CAPSULE | Freq: Three times a day (TID) | ORAL | 0 refills | Status: DC

## 2024-02-27 MED ORDER — PROMETHAZINE-DM 6.25-15 MG/5ML PO SYRP: 5.0000 mL | ORAL_SOLUTION | Freq: Four times a day (QID) | ORAL | 0 refills | Status: DC | PRN

## 2024-02-27 NOTE — Discharge Instructions (Addendum)
 EGD did not show any evidence of heart strain, your chest x-ray was negative for pneumonia, and your strep test was also negative.  Your physical exam is consistent with a viral upper respiratory infection.  Use over-the-counter Tylenol according the package instructions as needed for fever or pain.  You may continue to take your Zyrtec and Nasacort to help with your nasal congestion.  Use the Atrovent nasal spray, 2 squirts in each nostril every 6 hours, as needed for runny nose and postnasal drip.  Use the Tessalon Perles every 8 hours during the day.  Take them with a small sip of water.  They may give you some numbness to the base of your tongue or a metallic taste in your mouth, this is normal.  Use the Promethazine DM cough syrup at bedtime for cough and congestion.  It will make you drowsy so do not take it during the day.  Return for reevaluation or see your primary care provider for any new or worsening symptoms.

## 2024-02-27 NOTE — ED Provider Notes (Addendum)
 [facility]    CSN: [id number] Arrival date & time: 02/27/24  1437      History   Chief Complaint Chief Complaint  Patient presents with   Sore Throat   Cough   Nasal Congestion    HPI Katie Wise is a 68 y.o. female.   HPI  68 year old female with past medical history significant for hypertension, hypothyroidism, hemochromatosis, anxiety, anemia, and chronic kidney disease presents for evaluation of cough, sore throat, nasal congestion that started 5 days ago.  Her granddaughter was recently diagnosed with strep.  She reports that she initially had just nasal congestion but since she had been taking Zyrtec and Nasacort she started to have some nasal discharge.  She reports that her cough has been intermittently productive for Walt sputum and she has had some intermittent right upper chest pain.  She denies any fever, shortness breath, or wheezing.  Past Medical History:  Diagnosis Date   Abnormal CT scan, sinus    VASCULAR ABNORMALITY   Anemia    Anxiety    Back pain    Breast cyst 8+ years    HAD SURGICAL F/U WITH DR Michela Pitcher    Chronic kidney disease, stage 3b (HCC)    Elevated ferritin level    Family history of adverse reaction to anesthesia    mother N/V   Hemochromatosis    Homozygous on genetic testing March 2002   Hypertension    Hypothyroidism     Patient Active Problem List   Diagnosis Date Noted   Complex ovarian cyst 03/29/2022   FHx: ovarian cancer 03/29/2022   Kidney insufficiency 11/16/2021   Chronic kidney disease, stage 3 unspecified (HCC) 03/31/2021   Normocytic anemia 03/29/2021   Acute kidney injury (nontraumatic) (HCC) 10/27/2020   Hypothyroidism (acquired) 10/18/2017   Cerebrovascular accident, 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   Abnormal brain scan 08/07/2014   Cephalalgia 08/07/2014   Cerebral vascular accident (HCC) 08/07/2014   Cerebrovascular accident (CVA) (HCC) 08/07/2014   Anxiety state 05/01/2014   Hemochromatosis, hereditary (HCC)  05/01/2014   BP (high blood pressure) 05/01/2014   Ache in joint 05/01/2014   Anxiety 05/01/2014    Past Surgical History:  Procedure Laterality Date   BONE MARROW BIOPSY  01/26/2011   FLOW SHOWS POSITIVE FOR LGL'S, FISH IS NEG,   BREAST EXCISIONAL BIOPSY Right 2011   Negative   CHOLECYSTECTOMY  2013   COLONOSCOPY  07/2011   COLONOSCOPY N/A 06/24/2022   Procedure: COLONOSCOPY;  Surgeon: Jaynie Collins, DO;  Location: [facility] ENDOSCOPY;  Service: Gastroenterology;  Laterality: N/A;   ESOPHAGOGASTRODUODENOSCOPY  07/2011   ESOPHAGOGASTRODUODENOSCOPY N/A 06/24/2022   Procedure: ESOPHAGOGASTRODUODENOSCOPY (EGD);  Surgeon: Jaynie Collins, DO;  Location: [facility] ENDOSCOPY;  Service: Gastroenterology;  Laterality: N/A;   HYMENECTOMY  1973   LAPAROSCOPIC BILATERAL SALPINGO OOPHERECTOMY Bilateral 11/01/2021   Procedure: LAPAROSCOPIC BILATERAL SALPINGO OOPHORECTOMY;  Surgeon: Schermerhorn, Ihor Austin, MD;  Location: [facility] ORS;  Service: Gynecology;  Laterality: Bilateral;   NASAL SEPTUM SURGERY     TONSILLECTOMY  1971   TUBAL LIGATION  1995    OB History   No obstetric history on file.      Home Medications    Prior to Admission medications   Medication Sig Start Date End Date Taking? Authorizing Provider  benzonatate (TESSALON) 100 MG capsule Take 2 capsules (200 mg total) by mouth every 8 (eight) hours. 02/27/24  Yes Becky Augusta, NP  ipratropium (ATROVENT) 0.06 % nasal spray Place 2 sprays into both nostrils 4 (four)  times daily. 02/27/24  Yes Becky Augusta, NP  promethazine-dextromethorphan (PROMETHAZINE-DM) 6.25-15 MG/5ML syrup Take 5 mLs by mouth 4 (four) times daily as needed. 02/27/24  Yes Becky Augusta, NP  aspirin 81 MG chewable tablet Chew 81 mg by mouth daily.    [provider]  cholecalciferol (VITAMIN D3) 25 MCG (1000 UNIT) tablet Take 1,000 Units by mouth in the morning and at bedtime.    [provider]  diclofenac Sodium (VOLTAREN) 1 % GEL Apply 1  application topically daily as needed (pain).    [provider]  folic acid (FOLVITE) 1 MG tablet Take 1 mg by mouth daily.    [provider]  levothyroxine (SYNTHROID, LEVOTHROID) 50 MCG tablet Take 50 mcg by mouth daily before breakfast. 03/13/17   [provider]  lisinopril (PRINIVIL,ZESTRIL) 20 MG tablet Take 20 mg by mouth daily.    [provider]  Magnesium Oxide -Mg Supplement ([employer] MAGNESIUM) 500 MG TABS Take by mouth at bedtime.    [provider]  omeprazole (PRILOSEC) 20 MG capsule Take 20 mg by mouth daily as needed (acid reflux).    [provider]  Semaglutide,0.25 or 0.5MG /DOS, 2 MG/3ML SOPN Inject into the skin. 12/20/23   [provider]  Turmeric (QC TUMERIC COMPLEX PO) Take by mouth daily.    [provider]  venlafaxine XR (EFFEXOR-XR) 75 MG 24 hr capsule Take 75 mg by mouth in the morning and at bedtime. 04/25/21   [provider]  zinc gluconate 50 MG tablet Take 50 mg by mouth daily.    [provider]    Family History Family History  Problem Relation Age of Onset   Ovarian cancer Mother        BRCA status reported negative   Cancer Mother    Heart disease Mother    Cancer - Ovarian Mother    Cancer Father    Lung cancer Father    Breast cancer Paternal Aunt     Social History Social History   Tobacco Use   Smoking status: Never   Smokeless tobacco: Never  Vaping Use   Vaping status: Never Used  Substance Use Topics   Alcohol use: Yes    Comment: occassional wine   Drug use: No     Allergies   Amlodipine, Hydromorphone, and Sulfa antibiotics   Review of Systems Review of Systems  Constitutional:  Negative for fever.  HENT:  Positive for congestion, rhinorrhea and sore throat. Negative for ear pain.   Respiratory:  Positive for cough. Negative for shortness of breath and wheezing.   Cardiovascular:  Positive for chest pain.     Physical Exam Triage Vital  Signs ED Triage Vitals  Encounter Vitals Group     BP      Systolic BP Percentile      Diastolic BP Percentile      Pulse      Resp      Temp      Temp src      SpO2      Weight      Height      Head Circumference      Peak Flow      Pain Score      Pain Loc      Pain Education      Exclude from Growth Chart    No data found.  Updated Vital Signs BP 133/84 (BP Location: Right Arm)   Pulse 82   Temp  98.1 F (36.7 C) (Oral)   Resp 16   SpO2 96%   Visual Acuity Right Eye Distance:   Left Eye Distance:   Bilateral Distance:    Right Eye Near:   Left Eye Near:    Bilateral Near:     Physical Exam Vitals and nursing note reviewed.  Constitutional:      Appearance: Normal appearance. She is not ill-appearing.  HENT:     Head: Normocephalic and atraumatic.     Right Ear: Tympanic membrane, ear canal and external ear normal. There is no impacted cerumen.     Left Ear: Tympanic membrane, ear canal and external ear normal. There is no impacted cerumen.     Nose: Congestion and rhinorrhea present.     Comments: This mucosa is pale and mildly edematous with scant clear discharge in both nares.    Mouth/Throat:     Mouth: Mucous membranes are moist.     Pharynx: Oropharynx is clear. Posterior oropharyngeal erythema present. No oropharyngeal exudate.     Comments: Tonsillar pillars are surgically absent.  Posterior oropharynx demonstrates mild erythema with clear postnasal drip. Cardiovascular:     Rate and Rhythm: Normal rate and regular rhythm.     Pulses: Normal pulses.     Heart sounds: Normal heart sounds. No murmur heard.    No friction rub. No gallop.  Pulmonary:     Effort: Pulmonary effort is normal.     Breath sounds: Normal breath sounds. No wheezing, rhonchi or rales.  Musculoskeletal:     Cervical back: Normal range of motion and neck supple. No tenderness.  Lymphadenopathy:     Cervical: No cervical adenopathy.  Skin:    General: Skin is warm and dry.      Capillary Refill: Capillary refill takes less than 2 seconds.     Findings: No rash.  Neurological:     General: No focal deficit present.     Mental Status: She is alert and oriented to person, place, and time.      UC Treatments / Results  Labs (all labs ordered are listed, but only abnormal results are displayed) Labs Reviewed  GROUP A STREP BY PCR    EKG Sinus bradycardia with a ventricular rate of 59 bpm Peer interval 140 ms QRS duration 90 ms QT/QTc 392/380 ms No ST or T wave abnormalities noted.  Radiology No results found.  Procedures Procedures (including critical care time)  Medications Ordered in UC Medications - No data to display  Initial Impression / Assessment and Plan / UC Course  I have reviewed the triage vital signs and the nursing notes.  Pertinent labs & imaging results that were available during my care of the patient were reviewed by me and considered in my medical decision making (see chart for details).   Patient is a pleasant, nontoxic-appearing 68 year old female presenting for evaluation of 5 days with the respiratory symptoms outlined HPI above.  She reports that she is coughing up Petrucelli sputum intermittently and also having some intermittent right upper chest pain.  No shortness of breath or wheezing.  Also no fevers.  Cardiopulmonary exam reveals: Sounds in all fields.  Her granddaughter was recently diagnosed with strep and she was around her.  She does endorse some nasal congestion and runny nose as well.  Her physical exam reveals edematous nasal mucosa though it is pale in color giving a more allergic rhinitis clinical picture.  Differential diagnosis include COVID, influenza, allergic rhinitis, strep pharyngitis, pneumonia, viral  respiratory illness.  Given that she has had symptoms for the past 5 days I will not do a COVID or flu PCR but I will check a strep PCR.  I will also order a chest x-ray to evaluate for any acute cardiopulmonary  process.  Due to the fact that she is having chest pain I will also perform an EKG to evaluate for any cardiac abnormalities.  EKG shows sinus bradycardia without any ST or T wave abnormalities noted.  No change in morphology when compared to EKG from 10/26/2021.  Strep PCR is negative.  Chest x-ray independently reviewed and evaluated by me.  Impression: Lung fields are well aerated without evidence of infiltrate or effusion.  Cardiomediastinal silhouette appears normal.  Radiology overread is pending. The allergy impression states no acute intrathoracic process.  I will discharge patient with a diagnosis of viral URI with a cough.  I will have her continue to use her Nasacort and Zyrtec to help with some of her nasal congestion.  I will also prescribe Atrovent nasal spray that she can use for nasal congestion.  Tessalon Perles and Promethazine DM cough syrup for cough and congestion.  Return precautions reviewed.   Final Clinical Impressions(s) / UC Diagnoses   Final diagnoses:  Acute cough  Viral URI with cough     Discharge Instructions      EGD did not show any evidence of heart strain, your chest x-ray was negative for pneumonia, and your strep test was also negative.  Your physical exam is consistent with a viral upper respiratory infection.  Use over-the-counter Tylenol according the package instructions as needed for fever or pain.  You may continue to take your Zyrtec and Nasacort to help with your nasal congestion.  Use the Atrovent nasal spray, 2 squirts in each nostril every 6 hours, as needed for runny nose and postnasal drip.  Use the Tessalon Perles every 8 hours during the day.  Take them with a small sip of water.  They may give you some numbness to the base of your tongue or a metallic taste in your mouth, this is normal.  Use the Promethazine DM cough syrup at bedtime for cough and congestion.  It will make you drowsy so do not take it during the day.  Return  for reevaluation or see your primary care provider for any new or worsening symptoms.      ED Prescriptions     Medication Sig Dispense Auth. Provider   benzonatate (TESSALON) 100 MG capsule Take 2 capsules (200 mg total) by mouth every 8 (eight) hours. 21 capsule Becky Augusta, NP   ipratropium (ATROVENT) 0.06 % nasal spray Place 2 sprays into both nostrils 4 (four) times daily. 15 mL Becky Augusta, NP   promethazine-dextromethorphan (PROMETHAZINE-DM) 6.25-15 MG/5ML syrup Take 5 mLs by mouth 4 (four) times daily as needed. 118 mL Becky Augusta, NP      PDMP not reviewed this encounter.   Becky Augusta, NP 02/27/24 1639    Becky Augusta, NP 02/27/24 [phone]

## 2024-02-27 NOTE — ED Triage Notes (Signed)
 Pt presents with a cough, sore throat, and nasal congestion x 5 days. Her granddaughter was diagnosed with strep. She has taken OTC cold medication for her symptoms.

## 2024-03-08 ENCOUNTER — Encounter: Payer: Self-pay | Admitting: Internal Medicine

## 2024-03-11 ENCOUNTER — Encounter: Payer: Self-pay | Admitting: Internal Medicine

## 2024-04-03 DIAGNOSIS — N1832 Chronic kidney disease, stage 3b: Secondary | ICD-10-CM | POA: Diagnosis not present

## 2024-04-03 DIAGNOSIS — N2581 Secondary hyperparathyroidism of renal origin: Secondary | ICD-10-CM | POA: Diagnosis not present

## 2024-04-03 DIAGNOSIS — D631 Anemia in chronic kidney disease: Secondary | ICD-10-CM | POA: Diagnosis not present

## 2024-04-03 DIAGNOSIS — I10 Essential (primary) hypertension: Secondary | ICD-10-CM | POA: Diagnosis not present

## 2024-04-12 ENCOUNTER — Telehealth: Payer: Self-pay | Admitting: *Deleted

## 2024-04-12 NOTE — Telephone Encounter (Signed)
 Patient called wanting to have Dr. B to answer the labs that she had done 4/23.  Patient states that Dr. Erminio Hazy said that it would be best for Dr. Valentine Gasmen to look at it because some of the numbers were not good

## 2024-04-19 ENCOUNTER — Encounter: Payer: Self-pay | Admitting: *Deleted

## 2024-04-22 ENCOUNTER — Inpatient Hospital Stay: Payer: PPO | Attending: Internal Medicine

## 2024-04-22 ENCOUNTER — Other Ambulatory Visit: Payer: Self-pay | Admitting: *Deleted

## 2024-04-22 ENCOUNTER — Inpatient Hospital Stay: Payer: PPO

## 2024-04-22 DIAGNOSIS — D631 Anemia in chronic kidney disease: Secondary | ICD-10-CM | POA: Insufficient documentation

## 2024-04-22 DIAGNOSIS — N1832 Chronic kidney disease, stage 3b: Secondary | ICD-10-CM

## 2024-04-22 LAB — HEMOGLOBIN AND HEMATOCRIT (CANCER CENTER ONLY)
HCT: 26.7 % — ABNORMAL LOW (ref 36.0–46.0)
Hemoglobin: 9 g/dL — ABNORMAL LOW (ref 12.0–15.0)

## 2024-04-22 MED ORDER — EPOETIN ALFA-EPBX 20000 UNIT/ML IJ SOLN
20000.0000 [IU] | Freq: Once | INTRAMUSCULAR | Status: AC
  Administered 2024-04-22: 20000 [IU] via SUBCUTANEOUS
  Filled 2024-04-22: qty 1

## 2024-05-31 DIAGNOSIS — I10 Essential (primary) hypertension: Secondary | ICD-10-CM | POA: Diagnosis not present

## 2024-05-31 DIAGNOSIS — F419 Anxiety disorder, unspecified: Secondary | ICD-10-CM | POA: Diagnosis not present

## 2024-05-31 DIAGNOSIS — N289 Disorder of kidney and ureter, unspecified: Secondary | ICD-10-CM | POA: Diagnosis not present

## 2024-05-31 DIAGNOSIS — E039 Hypothyroidism, unspecified: Secondary | ICD-10-CM | POA: Diagnosis not present

## 2024-06-05 DIAGNOSIS — D2261 Melanocytic nevi of right upper limb, including shoulder: Secondary | ICD-10-CM | POA: Diagnosis not present

## 2024-06-05 DIAGNOSIS — L820 Inflamed seborrheic keratosis: Secondary | ICD-10-CM | POA: Diagnosis not present

## 2024-06-05 DIAGNOSIS — L538 Other specified erythematous conditions: Secondary | ICD-10-CM | POA: Diagnosis not present

## 2024-06-05 DIAGNOSIS — D2272 Melanocytic nevi of left lower limb, including hip: Secondary | ICD-10-CM | POA: Diagnosis not present

## 2024-06-05 DIAGNOSIS — D2262 Melanocytic nevi of left upper limb, including shoulder: Secondary | ICD-10-CM | POA: Diagnosis not present

## 2024-06-05 DIAGNOSIS — L570 Actinic keratosis: Secondary | ICD-10-CM | POA: Diagnosis not present

## 2024-06-05 DIAGNOSIS — D225 Melanocytic nevi of trunk: Secondary | ICD-10-CM | POA: Diagnosis not present

## 2024-06-19 DIAGNOSIS — R790 Abnormal level of blood mineral: Secondary | ICD-10-CM | POA: Diagnosis not present

## 2024-06-19 DIAGNOSIS — E875 Hyperkalemia: Secondary | ICD-10-CM | POA: Diagnosis not present

## 2024-06-22 ENCOUNTER — Encounter: Payer: Self-pay | Admitting: Emergency Medicine

## 2024-06-22 ENCOUNTER — Ambulatory Visit
Admission: EM | Admit: 2024-06-22 | Discharge: 2024-06-22 | Disposition: A | Discharge status: Home / Self Care | Attending: Emergency Medicine | Admitting: Emergency Medicine

## 2024-06-22 DIAGNOSIS — H00022 Hordeolum internum right lower eyelid: Secondary | ICD-10-CM | POA: Diagnosis not present

## 2024-06-22 MED ORDER — ERYTHROMYCIN 5 MG/GM OP OINT
TOPICAL_OINTMENT | OPHTHALMIC | 0 refills | Status: AC
Start: 2024-06-22 — End: ?

## 2024-06-22 NOTE — ED Triage Notes (Signed)
 Pt c/o right eye burning, itching and matted. Started 2 days ago.

## 2024-06-22 NOTE — Discharge Instructions (Addendum)
 Continue to apply warm compresses.  Perform eyelid hygiene with baby shampoo in a rich lather and vigorous scrubbing of your eyelids twice daily. Rinse thoroughly with water.  Apply Erythromycin  ointment to the eyelid margin with a clean Q-tip three times daily, and a 1/2 inch ribbon to the inside of the lower lid. Blink several times to liquify the ointment and spread it ober the inside of your eyelids. Do this for 7 days.  If your symptoms do not improve, or you develop changes in your vision follow up with your eye doctor.

## 2024-06-22 NOTE — ED Provider Notes (Signed)
 [facility]    CSN: [id number] Arrival date & time: 06/22/24  1314      History   Chief Complaint Chief Complaint  Patient presents with   Eye Problem    right    HPI Katie Wise is a 68 y.o. female.   HPI  68 year old female with past medical history significant for CVA, kidney insufficiency, hypothyroidism,, anxiety, high blood pressure, and hereditary hemochromatosis presents for evaluation of burning, itching, and matting to her right eye that started 2 days ago.  She reports that she does wear daily contacts in the right eye but she has not worn it in the past several days due to the irritation.  She describes the discharge is yellow.  She has not been around any others with similar symptoms.  Past Medical History:  Diagnosis Date   Abnormal CT scan, sinus    VASCULAR ABNORMALITY   Anemia    Anxiety    Back pain    Breast cyst 8+ years    HAD SURGICAL F/U WITH DR LORRENE    Chronic kidney disease, stage 3b (HCC)    Elevated ferritin level    Family history of adverse reaction to anesthesia    mother N/V   Hemochromatosis    Homozygous on genetic testing March 2002   Hypertension    Hypothyroidism     Patient Active Problem List   Diagnosis Date Noted   Complex ovarian cyst 03/29/2022   FHx: ovarian cancer 03/29/2022   Kidney insufficiency 11/16/2021   Chronic kidney disease, stage 3 unspecified (HCC) 03/31/2021   Normocytic anemia 03/29/2021   Acute kidney injury (nontraumatic) (HCC) 10/27/2020   Hypothyroidism (acquired) 10/18/2017   Cerebrovascular accident, 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   Abnormal brain scan 08/07/2014   Cephalalgia 08/07/2014   Cerebral vascular accident (HCC) 08/07/2014   Cerebrovascular accident (CVA) (HCC) 08/07/2014   Anxiety state 05/01/2014   Hemochromatosis, hereditary (HCC) 05/01/2014   BP (high blood pressure) 05/01/2014   Ache in joint 05/01/2014   Anxiety 05/01/2014    Past Surgical History:  Procedure Laterality Date    BONE MARROW BIOPSY  01/26/2011   FLOW SHOWS POSITIVE FOR LGL'S, FISH IS NEG,   BREAST EXCISIONAL BIOPSY Right 2011   Negative   CHOLECYSTECTOMY  2013   COLONOSCOPY  07/2011   COLONOSCOPY N/A 06/24/2022   Procedure: COLONOSCOPY;  Surgeon: Onita Elspeth Sharper, DO;  Location: [facility] ENDOSCOPY;  Service: Gastroenterology;  Laterality: N/A;   ESOPHAGOGASTRODUODENOSCOPY  07/2011   ESOPHAGOGASTRODUODENOSCOPY N/A 06/24/2022   Procedure: ESOPHAGOGASTRODUODENOSCOPY (EGD);  Surgeon: Onita Elspeth Sharper, DO;  Location: [facility] ENDOSCOPY;  Service: Gastroenterology;  Laterality: N/A;   HYMENECTOMY  1973   LAPAROSCOPIC BILATERAL SALPINGO OOPHERECTOMY Bilateral 11/01/2021   Procedure: LAPAROSCOPIC BILATERAL SALPINGO OOPHORECTOMY;  Surgeon: Schermerhorn, Debby PARAS, MD;  Location: [facility] ORS;  Service: Gynecology;  Laterality: Bilateral;   NASAL SEPTUM SURGERY     TONSILLECTOMY  1971   TUBAL LIGATION  1995    OB History   No obstetric history on file.      Home Medications    Prior to Admission medications   Medication Sig Start Date End Date Taking? Authorizing Provider  aspirin 81 MG chewable tablet Chew 81 mg by mouth daily.   Yes [provider]  cholecalciferol (VITAMIN D3) 25 MCG (1000 UNIT) tablet Take 1,000 Units by mouth in the morning and at bedtime.   Yes [provider]  diclofenac Sodium (VOLTAREN) 1 % GEL Apply 1 application topically daily as  needed (pain).   Yes [provider]  erythromycin  ophthalmic ointment Place a 1/2 inch ribbon of ointment into the lower eyelid every 8 hours for 7 days. 06/22/24  Yes Bernardino Ditch, NP  folic acid  (FOLVITE ) 1 MG tablet Take 1 mg by mouth daily.   Yes [provider]  levothyroxine (SYNTHROID, LEVOTHROID) 50 MCG tablet Take 50 mcg by mouth daily before breakfast. 03/13/17  Yes [provider]  lisinopril (PRINIVIL,ZESTRIL) 20 MG tablet Take 20 mg by mouth daily.   Yes [provider]  venlafaxine  XR (EFFEXOR-XR) 75 MG 24 hr capsule Take 75 mg by mouth in the morning and at bedtime. 04/25/21  Yes [provider]    Family History Family History  Problem Relation Age of Onset   Ovarian cancer Mother        BRCA status reported negative   Cancer Mother    Heart disease Mother    Cancer - Ovarian Mother    Cancer Father    Lung cancer Father    Breast cancer Paternal Aunt     Social History Social History   Tobacco Use   Smoking status: Never   Smokeless tobacco: Never  Vaping Use   Vaping status: Never Used  Substance Use Topics   Alcohol use: Yes    Comment: occassional wine   Drug use: No     Allergies   Amlodipine, Hydromorphone, and Sulfa antibiotics   Review of Systems Review of Systems  Eyes:  Positive for discharge, redness and itching. Negative for photophobia and visual disturbance.     Physical Exam Triage Vital Signs ED Triage Vitals  Encounter Vitals Group     BP      Girls Systolic BP Percentile      Girls Diastolic BP Percentile      Boys Systolic BP Percentile      Boys Diastolic BP Percentile      Pulse      Resp      Temp      Temp src      SpO2      Weight      Height      Head Circumference      Peak Flow      Pain Score      Pain Loc      Pain Education      Exclude from Growth Chart    No data found.  Updated Vital Signs BP 103/73 (BP Location: Right Arm)   Pulse 68   Temp 98 F (36.7 C) (Oral)   Resp 16   Ht 5' 3 (1.6 m)   Wt 136 lb 14.5 oz (62.1 kg)   SpO2 94%   BMI 24.25 kg/m   Visual Acuity Right Eye Distance: 20/50 corrected Left Eye Distance: 20/50 corrected Bilateral Distance: 20/50 corrected  Right Eye Near:   Left Eye Near:    Bilateral Near:     Physical Exam Vitals and nursing note reviewed.  Constitutional:      Appearance: Normal appearance.  Eyes:     General:        Right eye: Discharge present.        Left eye: No discharge.     Extraocular Movements: Extraocular movements  intact.     Conjunctiva/sclera: Conjunctivae normal.     Pupils: Pupils are equal, round, and reactive to light.  Skin:    General: Skin is warm and dry.     Capillary Refill:  Capillary refill takes less than 2 seconds.     Findings: No rash.  Neurological:     General: No focal deficit present.     Mental Status: She is alert and oriented to person, place, and time.      UC Treatments / Results  Labs (all labs ordered are listed, but only abnormal results are displayed) Labs Reviewed - No data to display  EKG   Radiology No results found.  Procedures Procedures (including critical care time)  Medications Ordered in UC Medications - No data to display  Initial Impression / Assessment and Plan / UC Course  I have reviewed the triage vital signs and the nursing notes.  Pertinent labs & imaging results that were available during my care of the patient were reviewed by me and considered in my medical decision making (see chart for details).   Patient is a pleasant, nontoxic-appearing 68 year old female presenting for evaluation of right eye irritation as outlined in HPI above.  As you can see image above, the patient has a ruptured internal stye that is producing a yellow mucousy discharge.  Her pupils equal round reactive and her EOM is intact.  Bulbar and labral conjunctiva are otherwise unremarkable.  I will put her on erythromycin  eye ointment 3 times daily x 7 days for treatment at this time.  Also discussed using warm compresses to help facilitate drainage of the stye and doing eyelid hygiene 3 times a day to help break up any loculations left within the stye to help facilitate drainage.  If her symptoms do not improve, or they worsen, she should follow-up with her ophthalmologist.   Final Clinical Impressions(s) / UC Diagnoses   Final diagnoses:  Hordeolum internum of right lower eyelid     Discharge Instructions      Continue to apply warm compresses.  Perform  eyelid hygiene with baby shampoo in a rich lather and vigorous scrubbing of your eyelids twice daily. Rinse thoroughly with water.  Apply Erythromycin  ointment to the eyelid margin with a clean Q-tip three times daily, and a 1/2 inch ribbon to the inside of the lower lid. Blink several times to liquify the ointment and spread it ober the inside of your eyelids. Do this for 7 days.  If your symptoms do not improve, or you develop changes in your vision follow up with your eye doctor.       ED Prescriptions     Medication Sig Dispense Auth. Provider   erythromycin  ophthalmic ointment Place a 1/2 inch ribbon of ointment into the lower eyelid every 8 hours for 7 days. 3.5 g Bernardino Ditch, NP      PDMP not reviewed this encounter.   Bernardino Ditch, NP 06/22/24 1351

## 2024-07-19 ENCOUNTER — Other Ambulatory Visit: Payer: Self-pay | Admitting: *Deleted

## 2024-07-19 DIAGNOSIS — D631 Anemia in chronic kidney disease: Secondary | ICD-10-CM

## 2024-07-22 ENCOUNTER — Ambulatory Visit: Payer: PPO

## 2024-07-22 ENCOUNTER — Inpatient Hospital Stay: Payer: PPO | Attending: Internal Medicine

## 2024-07-22 ENCOUNTER — Ambulatory Visit: Payer: PPO | Admitting: Oncology

## 2024-07-22 ENCOUNTER — Inpatient Hospital Stay: Payer: PPO

## 2024-07-22 ENCOUNTER — Inpatient Hospital Stay: Payer: PPO | Admitting: Internal Medicine

## 2024-07-22 ENCOUNTER — Encounter: Payer: Self-pay | Admitting: Internal Medicine

## 2024-07-22 ENCOUNTER — Other Ambulatory Visit: Payer: PPO

## 2024-07-22 DIAGNOSIS — D631 Anemia in chronic kidney disease: Secondary | ICD-10-CM | POA: Insufficient documentation

## 2024-07-22 DIAGNOSIS — N1832 Chronic kidney disease, stage 3b: Secondary | ICD-10-CM | POA: Insufficient documentation

## 2024-07-22 NOTE — Assessment & Plan Note (Deleted)
 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   No evidence of iron deficiency-on Retacrit . [EGD/[facility]- 2023]  # Today hemoglobin is 9.8 improved. Proceed Retacrit  today.  Continue Retacrit  every 3-6 months.  Not on oral iron/hemochromatosis.    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  No Kidney Bx- stable;  [Dr.Lateef]- stable  # Weight loss- s/p ozempic- off sec to insurance.   # Hereditary hemochromatosis- homozygous [as per patient]-without organ dysfunction-especially with the ongoing anemia.  Monitor for now- stable.   # DISPOSITION:  # proceed with RETACRIT   # in 3 months- H&H- possible retacrit  # Follow up in 6 months MD; labs- cbc/bmp/iron studies/ferritin-possible retacrit -Dr.B.   Cc; Dr.Bronstein

## 2024-07-24 ENCOUNTER — Inpatient Hospital Stay

## 2024-07-24 ENCOUNTER — Inpatient Hospital Stay: Admitting: Internal Medicine

## 2024-07-24 ENCOUNTER — Encounter: Payer: Self-pay | Admitting: Internal Medicine

## 2024-07-24 VITALS — BP 113/75 | HR 81 | Temp 96.9°F | Resp 16 | Ht 63.0 in | Wt 131.2 lb

## 2024-07-24 DIAGNOSIS — D649 Anemia, unspecified: Secondary | ICD-10-CM

## 2024-07-24 DIAGNOSIS — D631 Anemia in chronic kidney disease: Secondary | ICD-10-CM | POA: Diagnosis not present

## 2024-07-24 DIAGNOSIS — N1832 Chronic kidney disease, stage 3b: Secondary | ICD-10-CM

## 2024-07-24 LAB — CBC WITH DIFFERENTIAL (CANCER CENTER ONLY)
Abs Immature Granulocytes: 0.01 K/uL (ref 0.00–0.07)
Basophils Absolute: 0 K/uL (ref 0.0–0.1)
Basophils Relative: 1 %
Eosinophils Absolute: 0.3 K/uL (ref 0.0–0.5)
Eosinophils Relative: 6 %
HCT: 27.1 % — ABNORMAL LOW (ref 36.0–46.0)
Hemoglobin: 9.2 g/dL — ABNORMAL LOW (ref 12.0–15.0)
Immature Granulocytes: 0 %
Lymphocytes Relative: 48 %
Lymphs Abs: 2.2 K/uL (ref 0.7–4.0)
MCH: 31.7 pg (ref 26.0–34.0)
MCHC: 33.9 g/dL (ref 30.0–36.0)
MCV: 93.4 fL (ref 80.0–100.0)
Monocytes Absolute: 0.5 K/uL (ref 0.1–1.0)
Monocytes Relative: 11 %
Neutro Abs: 1.6 K/uL — ABNORMAL LOW (ref 1.7–7.7)
Neutrophils Relative %: 34 %
Platelet Count: 313 K/uL (ref 150–400)
RBC: 2.9 MIL/uL — ABNORMAL LOW (ref 3.87–5.11)
RDW: 13.1 % (ref 11.5–15.5)
WBC Count: 4.6 K/uL (ref 4.0–10.5)
nRBC: 0 % (ref 0.0–0.2)

## 2024-07-24 LAB — FERRITIN: Ferritin: 740 ng/mL — ABNORMAL HIGH (ref 11–307)

## 2024-07-24 LAB — IRON AND TIBC
Iron: 187 ug/dL — ABNORMAL HIGH (ref 28–170)
Saturation Ratios: 64 % — ABNORMAL HIGH (ref 10.4–31.8)
TIBC: 291 ug/dL (ref 250–450)
UIBC: 104 ug/dL

## 2024-07-24 MED ORDER — EPOETIN ALFA-EPBX 20000 UNIT/ML IJ SOLN
20000.0000 [IU] | Freq: Once | INTRAMUSCULAR | Status: AC
  Administered 2024-07-24 (×2): 20000 [IU] via SUBCUTANEOUS
  Filled 2024-07-24: qty 1

## 2024-07-24 NOTE — Progress Notes (Signed)
 Fatigue/weakness: YES Dyspena: DOE Light headedness: NO Blood in stool: NO  Her spouse that you see had a heart attack last week.

## 2024-07-24 NOTE — Progress Notes (Signed)
  [facility] OFFICE PROGRESS NOTE  Patient Care Team: Glover Lenis, MD as PCP - General (Family Medicine) Marcelino Gales, MD as Consulting Physician (Nephrology) Rennie Cindy SAUNDERS, MD as Consulting Physician (Oncology)   SUMMARY OF HEMATOLOGIC-ONCOLOGIC HISTORY:  # 2002- HEREDITARY HEMOCHROMATOSIS HOMOZYGOUS [goal ferritin 100-150]    # 2012- BMbx [sec to Anemia]-Normo-cellular; T Large Granular Lymphocytes by flowcytometry [4.5%] primary vs Reactive; FISH MDS- NEG  # Hx of Stroke [2012; no deficits]; CKD stage III [Dr.Lateef; ? Kidney Bx]     INTERVAL HISTORY: Alone.  Ambulating independently.  68 year-old female patient with a prior history of homozygous hereditary hemochromatosis; recently diagnosed chronic kidney disease/anemia is here for follow-up.  Patient admits to fatigue.  She has been under stress because of her husband's recent heart attack.  Nno worsening shortness of breath or cough.  No blood in stools or black or stools.   Review of Systems  Constitutional:  Positive for malaise/fatigue. Negative for chills, diaphoresis, fever and weight loss.  HENT:  Negative for nosebleeds and sore throat.   Eyes:  Negative for double vision.  Respiratory:  Negative for cough, hemoptysis, sputum production, shortness of breath and wheezing.   Cardiovascular:  Negative for chest pain, palpitations, orthopnea and leg swelling.  Gastrointestinal:  Negative for abdominal pain, blood in stool, constipation, diarrhea, heartburn, melena, nausea and vomiting.  Genitourinary:  Negative for dysuria, frequency and urgency.  Musculoskeletal:  Positive for back pain. Negative for joint pain.  Skin: Negative.  Negative for itching and rash.  Neurological:  Negative for dizziness, tingling, focal weakness, weakness and headaches.  Endo/Heme/Allergies:  Does not bruise/bleed easily.  Psychiatric/Behavioral:  Negative for depression. The patient is not nervous/anxious and  does not have insomnia.       PAST MEDICAL HISTORY :  Past Medical History:  Diagnosis Date   Abnormal CT scan, sinus    VASCULAR ABNORMALITY   Anemia    Anxiety    Back pain    Breast cyst 8+ years    HAD SURGICAL F/U WITH DR ELY    Chronic kidney disease, stage 3b (HCC)    Elevated ferritin level    Family history of adverse reaction to anesthesia    mother N/V   Hemochromatosis    Homozygous on genetic testing March 2002   Hypertension    Hypothyroidism     PAST SURGICAL HISTORY :   Past Surgical History:  Procedure Laterality Date   BONE MARROW BIOPSY  01/26/2011   FLOW SHOWS POSITIVE FOR LGL'S, FISH IS NEG,   BREAST EXCISIONAL BIOPSY Right 2011   Negative   CHOLECYSTECTOMY  2013   COLONOSCOPY  07/2011   COLONOSCOPY N/A 06/24/2022   Procedure: COLONOSCOPY;  Surgeon: Onita Elspeth Sharper, DO;  Location: [facility] ENDOSCOPY;  Service: Gastroenterology;  Laterality: N/A;   ESOPHAGOGASTRODUODENOSCOPY  07/2011   ESOPHAGOGASTRODUODENOSCOPY N/A 06/24/2022   Procedure: ESOPHAGOGASTRODUODENOSCOPY (EGD);  Surgeon: Onita Elspeth Sharper, DO;  Location: [facility] ENDOSCOPY;  Service: Gastroenterology;  Laterality: N/A;   HYMENECTOMY  1973   LAPAROSCOPIC BILATERAL SALPINGO OOPHERECTOMY Bilateral 11/01/2021   Procedure: LAPAROSCOPIC BILATERAL SALPINGO OOPHORECTOMY;  Surgeon: Schermerhorn, Debby PARAS, MD;  Location: [facility] ORS;  Service: Gynecology;  Laterality: Bilateral;   NASAL SEPTUM SURGERY     TONSILLECTOMY  1971   TUBAL LIGATION  1995    FAMILY HISTORY :   Family History  Problem Relation Age of Onset   Ovarian cancer Mother        BRCA status reported negative  Cancer Mother    Heart disease Mother    Cancer - Ovarian Mother    Cancer Father    Lung cancer Father    Breast cancer Paternal Aunt     SOCIAL HISTORY:   Social History   Tobacco Use   Smoking status: Never   Smokeless tobacco: Never  Vaping Use   Vaping status: Never Used  Substance Use Topics   Alcohol  use: Yes    Comment: occassional wine   Drug use: No    ALLERGIES:  is allergic to amlodipine, hydromorphone, and sulfa antibiotics.  MEDICATIONS:  Current Outpatient Medications  Medication Sig Dispense Refill   aspirin 81 MG chewable tablet Chew 81 mg by mouth daily.     cholecalciferol (VITAMIN D3) 25 MCG (1000 UNIT) tablet Take 1,000 Units by mouth in the morning and at bedtime.     diclofenac Sodium (VOLTAREN) 1 % GEL Apply 1 application topically daily as needed (pain).     erythromycin  ophthalmic ointment Place a 1/2 inch ribbon of ointment into the lower eyelid every 8 hours for 7 days. 3.5 g 0   folic acid  (FOLVITE ) 1 MG tablet Take 1 mg by mouth daily.     levothyroxine (SYNTHROID, LEVOTHROID) 50 MCG tablet Take 50 mcg by mouth daily before breakfast.     lisinopril (PRINIVIL,ZESTRIL) 20 MG tablet Take 20 mg by mouth daily.     venlafaxine XR (EFFEXOR-XR) 75 MG 24 hr capsule Take 75 mg by mouth in the morning and at bedtime.     No current facility-administered medications for this visit.    PHYSICAL EXAMINATION: ECOG PERFORMANCE STATUS: 0 - Asymptomatic  BP 113/75 (BP Location: Left Arm, Patient Position: Sitting, Cuff Size: Normal)   Pulse 81   Temp (!) 96.9 F (36.1 C) (Tympanic)   Resp 16   Ht 5' 3 (1.6 m)   Wt 131 lb 3.2 oz (59.5 kg)   SpO2 100%   BMI 23.24 kg/m   Filed Weights   07/24/24 0919  Weight: 131 lb 3.2 oz (59.5 kg)     Physical Exam HENT:     Head: Normocephalic and atraumatic.     Mouth/Throat:     Pharynx: No oropharyngeal exudate.  Eyes:     Pupils: Pupils are equal, round, and reactive to light.  Cardiovascular:     Rate and Rhythm: Normal rate and regular rhythm.  Pulmonary:     Effort: No respiratory distress.     Breath sounds: No wheezing.  Abdominal:     General: Bowel sounds are normal. There is no distension.     Palpations: Abdomen is soft. There is no mass.     Tenderness: There is no abdominal tenderness. There is no  guarding or rebound.  Musculoskeletal:        General: No tenderness. Normal range of motion.     Cervical back: Normal range of motion and neck supple.  Skin:    General: Skin is warm.  Neurological:     Mental Status: She is alert and oriented to person, place, and time.  Psychiatric:        Mood and Affect: Affect normal.      LABORATORY DATA:  I have reviewed the data as listed    Component Value Date/Time   NA 136 01/23/2024 1322   NA 141 04/13/2018 1517   NA 142 05/19/2012 1957   K 4.7 01/23/2024 1322   K 4.6 05/19/2012 1957   CL 102 01/23/2024  1322   CL 108 (H) 05/19/2012 1957   CO2 25 01/23/2024 1322   CO2 26 05/19/2012 1957   GLUCOSE 71 01/23/2024 1322   GLUCOSE 94 05/19/2012 1957   BUN 38 (H) 01/23/2024 1322   BUN 34 (H) 04/13/2018 1517   BUN 10 05/19/2012 1957   CREATININE 1.75 (H) 01/23/2024 1322   CREATININE 1.09 05/19/2012 1957   CALCIUM 9.8 01/23/2024 1322   CALCIUM 9.0 05/19/2012 1957   PROT 7.4 05/11/2021 1109   PROT 7.3 04/13/2018 1517   PROT 6.2 (L) 05/19/2012 1957   ALBUMIN 4.4 05/11/2021 1109   ALBUMIN 4.9 (H) 04/13/2018 1517   ALBUMIN 3.4 05/19/2012 1957   AST 23 05/11/2021 1109   AST 31 05/19/2012 1957   ALT 20 05/11/2021 1109   ALT 28 05/19/2012 1957   ALKPHOS 57 05/11/2021 1109   ALKPHOS 61 05/19/2012 1957   BILITOT 0.5 05/11/2021 1109   BILITOT 0.3 04/13/2018 1517   BILITOT 0.3 05/19/2012 1957   GFRNONAA 31 (L) 01/23/2024 1322   GFRNONAA 56 (L) 05/19/2012 1957   GFRAA 58 (L) 04/27/2020 1035   GFRAA >60 05/19/2012 1957    No results found for: SPEP, UPEP  Lab Results  Component Value Date   WBC 4.6 07/24/2024   NEUTROABS 1.6 (L) 07/24/2024   HGB 9.2 (L) 07/24/2024   HCT 27.1 (L) 07/24/2024   MCV 93.4 07/24/2024   PLT 313 07/24/2024      Chemistry      Component Value Date/Time   NA 136 01/23/2024 1322   NA 141 04/13/2018 1517   NA 142 05/19/2012 1957   K 4.7 01/23/2024 1322   K 4.6 05/19/2012 1957   CL 102  01/23/2024 1322   CL 108 (H) 05/19/2012 1957   CO2 25 01/23/2024 1322   CO2 26 05/19/2012 1957   BUN 38 (H) 01/23/2024 1322   BUN 34 (H) 04/13/2018 1517   BUN 10 05/19/2012 1957   CREATININE 1.75 (H) 01/23/2024 1322   CREATININE 1.09 05/19/2012 1957      Component Value Date/Time   CALCIUM 9.8 01/23/2024 1322   CALCIUM 9.0 05/19/2012 1957   ALKPHOS 57 05/11/2021 1109   ALKPHOS 61 05/19/2012 1957   AST 23 05/11/2021 1109   AST 31 05/19/2012 1957   ALT 20 05/11/2021 1109   ALT 28 05/19/2012 1957   BILITOT 0.5 05/11/2021 1109   BILITOT 0.3 04/13/2018 1517   BILITOT 0.3 05/19/2012 1957        ASSESSMENT & PLAN:   Normocytic anemia # worsening anemia-hemoglobin 8.3-9.0-likely secondary to CKD-III.   No evidence of iron deficiency-on Retacrit . [EGD/[facility]o- 2023]  # Today hemoglobin is 9.8 improved. Proceed Retacrit  today.  Continue Retacrit  every 3-6 months.  Not on oral iron/hemochromatosis.    #CKD- UNclear etiology; October 2021 ultrasound kidneys negative; NSIADs vs others.  No Kidney Bx- stable;  [Dr.Lateef]- stable  # Hereditary hemochromatosis- homozygous [as per patient]-without organ dysfunction-especially with the ongoing anemia.  Monitor for now- stable.   # DISPOSITION:  # proceed with RETACRIT   # in 3 months- H&H- possible retacrit  # Follow up in 6 months MD; labs- cbc/bmp/iron studies/ferritin-possible retacrit -Dr.B.   Cc; Dr.Bronstein.     Cindy JONELLE Joe, MD 07/24/2024 10:31 AM

## 2024-07-24 NOTE — Assessment & Plan Note (Signed)
#   worsening anemia-hemoglobin 8.3-9.0-likely secondary to CKD-III.   No evidence of iron deficiency-on Retacrit . [EGD/[facility]- 2023]  # Today hemoglobin is 9.8 improved. Proceed Retacrit  today.  Continue Retacrit  every 3-6 months.  Not on oral iron/hemochromatosis.    #CKD- UNclear etiology; October 2021 ultrasound kidneys negative; NSIADs vs others.  No Kidney Bx- stable;  [Dr.Lateef]- stable  # Hereditary hemochromatosis- homozygous [as per patient]-without organ dysfunction-especially with the ongoing anemia.  Monitor for now- stable.   # DISPOSITION:  # proceed with RETACRIT   # in 3 months- H&H- possible retacrit  # Follow up in 6 months MD; labs- cbc/bmp/iron studies/ferritin-possible retacrit -Dr.B.   Cc; Dr.Bronstein.

## 2024-07-29 DIAGNOSIS — N1832 Chronic kidney disease, stage 3b: Secondary | ICD-10-CM | POA: Diagnosis not present

## 2024-07-29 DIAGNOSIS — I10 Essential (primary) hypertension: Secondary | ICD-10-CM | POA: Diagnosis not present

## 2024-07-29 DIAGNOSIS — N2581 Secondary hyperparathyroidism of renal origin: Secondary | ICD-10-CM | POA: Diagnosis not present

## 2024-07-29 DIAGNOSIS — D631 Anemia in chronic kidney disease: Secondary | ICD-10-CM | POA: Diagnosis not present

## 2024-08-07 DIAGNOSIS — H2513 Age-related nuclear cataract, bilateral: Secondary | ICD-10-CM | POA: Diagnosis not present

## 2024-08-07 DIAGNOSIS — H524 Presbyopia: Secondary | ICD-10-CM | POA: Diagnosis not present

## 2024-08-07 DIAGNOSIS — D23112 Other benign neoplasm of skin of right lower eyelid, including canthus: Secondary | ICD-10-CM | POA: Diagnosis not present

## 2024-08-07 DIAGNOSIS — H5213 Myopia, bilateral: Secondary | ICD-10-CM | POA: Diagnosis not present

## 2024-08-08 DIAGNOSIS — N2581 Secondary hyperparathyroidism of renal origin: Secondary | ICD-10-CM | POA: Diagnosis not present

## 2024-08-08 DIAGNOSIS — N184 Chronic kidney disease, stage 4 (severe): Secondary | ICD-10-CM | POA: Diagnosis not present

## 2024-08-08 DIAGNOSIS — I10 Essential (primary) hypertension: Secondary | ICD-10-CM | POA: Diagnosis not present

## 2024-08-08 DIAGNOSIS — D631 Anemia in chronic kidney disease: Secondary | ICD-10-CM | POA: Diagnosis not present

## 2024-08-16 DIAGNOSIS — H00023 Hordeolum internum right eye, unspecified eyelid: Secondary | ICD-10-CM | POA: Diagnosis not present

## 2024-08-16 DIAGNOSIS — D23112 Other benign neoplasm of skin of right lower eyelid, including canthus: Secondary | ICD-10-CM | POA: Diagnosis not present

## 2024-08-23 DIAGNOSIS — H00023 Hordeolum internum right eye, unspecified eyelid: Secondary | ICD-10-CM | POA: Diagnosis not present

## 2024-10-11 ENCOUNTER — Encounter: Payer: Self-pay | Admitting: Internal Medicine

## 2024-10-24 ENCOUNTER — Inpatient Hospital Stay

## 2024-10-24 ENCOUNTER — Inpatient Hospital Stay: Attending: Internal Medicine

## 2024-10-24 DIAGNOSIS — D649 Anemia, unspecified: Secondary | ICD-10-CM

## 2024-10-24 DIAGNOSIS — D631 Anemia in chronic kidney disease: Secondary | ICD-10-CM | POA: Diagnosis not present

## 2024-10-24 DIAGNOSIS — N1832 Chronic kidney disease, stage 3b: Secondary | ICD-10-CM | POA: Diagnosis not present

## 2024-10-24 LAB — HEMOGLOBIN AND HEMATOCRIT (CANCER CENTER ONLY)
HCT: 24.8 % — ABNORMAL LOW (ref 36.0–46.0)
Hemoglobin: 8.4 g/dL — ABNORMAL LOW (ref 12.0–15.0)

## 2024-10-24 MED ORDER — EPOETIN ALFA-EPBX 20000 UNIT/ML IJ SOLN
20000.0000 [IU] | Freq: Once | INTRAMUSCULAR | Status: AC
  Administered 2024-10-24: 20000 [IU] via SUBCUTANEOUS
  Filled 2024-10-24: qty 1

## 2024-12-03 ENCOUNTER — Other Ambulatory Visit: Payer: Self-pay | Admitting: Family Medicine

## 2024-12-03 DIAGNOSIS — Z1231 Encounter for screening mammogram for malignant neoplasm of breast: Secondary | ICD-10-CM

## 2025-01-24 ENCOUNTER — Other Ambulatory Visit

## 2025-01-24 ENCOUNTER — Ambulatory Visit

## 2025-01-24 ENCOUNTER — Ambulatory Visit: Admitting: Internal Medicine
